# Patient Record
Sex: Female | Born: 1935 | Race: Asian | Hispanic: No | Smoking: Never smoker
Health system: Southern US, Community
[De-identification: ages and names within clinical notes are randomized; demographics above are authoritative.]

## PROBLEM LIST (undated history)

## (undated) DIAGNOSIS — R0602 Shortness of breath: Secondary | ICD-10-CM

## (undated) DIAGNOSIS — M1990 Unspecified osteoarthritis, unspecified site: Secondary | ICD-10-CM

## (undated) DIAGNOSIS — Z960 Presence of urogenital implants: Secondary | ICD-10-CM

## (undated) DIAGNOSIS — I2510 Atherosclerotic heart disease of native coronary artery without angina pectoris: Secondary | ICD-10-CM

## (undated) DIAGNOSIS — R5381 Other malaise: Secondary | ICD-10-CM

## (undated) DIAGNOSIS — R634 Abnormal weight loss: Secondary | ICD-10-CM

## (undated) DIAGNOSIS — I219 Acute myocardial infarction, unspecified: Secondary | ICD-10-CM

## (undated) DIAGNOSIS — I495 Sick sinus syndrome: Secondary | ICD-10-CM

## (undated) DIAGNOSIS — G8929 Other chronic pain: Secondary | ICD-10-CM

## (undated) DIAGNOSIS — I200 Unstable angina: Secondary | ICD-10-CM

## (undated) DIAGNOSIS — N1330 Unspecified hydronephrosis: Secondary | ICD-10-CM

## (undated) DIAGNOSIS — I4892 Unspecified atrial flutter: Secondary | ICD-10-CM

## (undated) DIAGNOSIS — R0603 Acute respiratory distress: Secondary | ICD-10-CM

## (undated) DIAGNOSIS — B029 Zoster without complications: Secondary | ICD-10-CM

## (undated) DIAGNOSIS — Z515 Encounter for palliative care: Secondary | ICD-10-CM

## (undated) DIAGNOSIS — H269 Unspecified cataract: Secondary | ICD-10-CM

## (undated) DIAGNOSIS — I639 Cerebral infarction, unspecified: Secondary | ICD-10-CM

## (undated) DIAGNOSIS — R5383 Other fatigue: Secondary | ICD-10-CM

## (undated) DIAGNOSIS — I5032 Chronic diastolic (congestive) heart failure: Secondary | ICD-10-CM

## (undated) DIAGNOSIS — F419 Anxiety disorder, unspecified: Secondary | ICD-10-CM

## (undated) DIAGNOSIS — M549 Dorsalgia, unspecified: Secondary | ICD-10-CM

## (undated) DIAGNOSIS — N390 Urinary tract infection, site not specified: Secondary | ICD-10-CM

## (undated) DIAGNOSIS — I252 Old myocardial infarction: Secondary | ICD-10-CM

## (undated) DIAGNOSIS — I10 Essential (primary) hypertension: Secondary | ICD-10-CM

## (undated) DIAGNOSIS — I5033 Acute on chronic diastolic (congestive) heart failure: Secondary | ICD-10-CM

## (undated) DIAGNOSIS — I509 Heart failure, unspecified: Secondary | ICD-10-CM

## (undated) DIAGNOSIS — E43 Unspecified severe protein-calorie malnutrition: Secondary | ICD-10-CM

## (undated) HISTORY — DX: Unspecified cataract: H26.9

## (undated) HISTORY — DX: Sick sinus syndrome: I49.5

## (undated) HISTORY — DX: Other fatigue: R53.83

## (undated) HISTORY — DX: Unstable angina: I20.0

## (undated) HISTORY — DX: Encounter for palliative care: Z51.5

## (undated) HISTORY — DX: Unspecified hydronephrosis: N13.30

## (undated) HISTORY — DX: Essential (primary) hypertension: I10

## (undated) HISTORY — DX: Shortness of breath: R06.02

## (undated) HISTORY — DX: Chronic diastolic (congestive) heart failure: I50.32

## (undated) HISTORY — DX: Unspecified severe protein-calorie malnutrition: E43

## (undated) HISTORY — DX: Acute on chronic diastolic (congestive) heart failure: I50.33

## (undated) HISTORY — DX: Old myocardial infarction: I25.2

## (undated) HISTORY — DX: Abnormal weight loss: R63.4

## (undated) HISTORY — DX: Other malaise: R53.81

## (undated) HISTORY — DX: Unspecified atrial flutter: I48.92

## (undated) HISTORY — DX: Urinary tract infection, site not specified: N39.0

## (undated) HISTORY — DX: Acute respiratory distress: R06.03

## (undated) HISTORY — DX: Zoster without complications: B02.9

---

## 2009-09-29 ENCOUNTER — Emergency Department (HOSPITAL_COMMUNITY): Admission: EM | Admit: 2009-09-29 | Discharge: 2009-09-29 | Payer: Self-pay | Admitting: Emergency Medicine

## 2010-04-17 ENCOUNTER — Inpatient Hospital Stay (HOSPITAL_COMMUNITY)
Admission: EM | Admit: 2010-04-17 | Discharge: 2010-04-20 | DRG: 308 | Disposition: A | Discharge status: Home Health | Payer: Medicaid Other | Source: Other Acute Inpatient Hospital | Attending: Cardiology | Admitting: Cardiology

## 2010-04-17 ENCOUNTER — Emergency Department (HOSPITAL_COMMUNITY): Payer: Medicaid Other

## 2010-04-17 ENCOUNTER — Emergency Department (HOSPITAL_COMMUNITY)
Admission: EM | Admit: 2010-04-17 | Discharge: 2010-04-17 | Disposition: A | Discharge status: Other Acute Inpatient Hospital | Payer: Medicaid Other | Attending: Emergency Medicine | Admitting: Emergency Medicine

## 2010-04-17 DIAGNOSIS — I499 Cardiac arrhythmia, unspecified: Secondary | ICD-10-CM | POA: Diagnosis present

## 2010-04-17 DIAGNOSIS — N390 Urinary tract infection, site not specified: Secondary | ICD-10-CM | POA: Diagnosis present

## 2010-04-17 DIAGNOSIS — R0602 Shortness of breath: Secondary | ICD-10-CM | POA: Insufficient documentation

## 2010-04-17 DIAGNOSIS — I10 Essential (primary) hypertension: Secondary | ICD-10-CM | POA: Diagnosis present

## 2010-04-17 DIAGNOSIS — R000 Tachycardia, unspecified: Secondary | ICD-10-CM | POA: Insufficient documentation

## 2010-04-17 DIAGNOSIS — I4589 Other specified conduction disorders: Secondary | ICD-10-CM | POA: Insufficient documentation

## 2010-04-17 DIAGNOSIS — I5031 Acute diastolic (congestive) heart failure: Secondary | ICD-10-CM | POA: Diagnosis present

## 2010-04-17 DIAGNOSIS — Z7982 Long term (current) use of aspirin: Secondary | ICD-10-CM

## 2010-04-17 DIAGNOSIS — I214 Non-ST elevation (NSTEMI) myocardial infarction: Secondary | ICD-10-CM

## 2010-04-17 DIAGNOSIS — R112 Nausea with vomiting, unspecified: Secondary | ICD-10-CM | POA: Insufficient documentation

## 2010-04-17 DIAGNOSIS — I4892 Unspecified atrial flutter: Principal | ICD-10-CM | POA: Diagnosis present

## 2010-04-17 DIAGNOSIS — I509 Heart failure, unspecified: Secondary | ICD-10-CM | POA: Diagnosis present

## 2010-04-17 DIAGNOSIS — R0682 Tachypnea, not elsewhere classified: Secondary | ICD-10-CM | POA: Insufficient documentation

## 2010-04-17 DIAGNOSIS — R072 Precordial pain: Secondary | ICD-10-CM

## 2010-04-17 DIAGNOSIS — I495 Sick sinus syndrome: Secondary | ICD-10-CM | POA: Diagnosis present

## 2010-04-17 LAB — COMPREHENSIVE METABOLIC PANEL
ALT: 23 U/L (ref 0–35)
Alkaline Phosphatase: 70 U/L (ref 39–117)
CO2: 24 mEq/L (ref 19–32)
Calcium: 8.8 mg/dL (ref 8.4–10.5)
Chloride: 101 mEq/L (ref 96–112)
GFR calc Af Amer: 59 mL/min — ABNORMAL LOW (ref >60)
GFR calc non Af Amer: 49 mL/min — ABNORMAL LOW (ref >60)

## 2010-04-17 LAB — POCT I-STAT, CHEM 8
BUN: 19 mg/dL (ref 6–23)
Creatinine, Ser: 1.1 mg/dL (ref 0.4–1.2)
HCT: 47 % — ABNORMAL HIGH (ref 36.0–46.0)
Hemoglobin: 16 g/dL — ABNORMAL HIGH (ref 12.0–15.0)
Potassium: 4.4 mEq/L (ref 3.5–5.1)
Sodium: 136 mEq/L (ref 135–145)

## 2010-04-17 LAB — URINE MICROSCOPIC-ADD ON

## 2010-04-17 LAB — URINALYSIS, ROUTINE W REFLEX MICROSCOPIC
Bilirubin Urine: NEGATIVE
Ketones, ur: NEGATIVE mg/dL
Protein, ur: >300 mg/dL — AB
Specific Gravity, Urine: 1.023 (ref 1.005–1.030)
pH: 5.5 (ref 5.0–8.0)

## 2010-04-17 LAB — CARDIAC PANEL(CRET KIN+CKTOT+MB+TROPI)
CK, MB: 6.1 ng/mL (ref 0.3–4.0)
Relative Index: INVALID (ref 0.0–2.5)
Total CK: 74 U/L (ref 7–177)
Total CK: 81 U/L (ref 7–177)

## 2010-04-17 LAB — CBC
HCT: 45 % (ref 36.0–46.0)
MCHC: 34.1 g/dL (ref 30.0–36.0)
MCV: 90.4 fL (ref 78.0–100.0)
MCV: 91.8 fL (ref 78.0–100.0)
Platelets: 288 10*3/uL (ref 150–400)
RDW: 12.1 % (ref 11.5–15.5)

## 2010-04-17 LAB — APTT: aPTT: 32 seconds (ref 24–37)

## 2010-04-17 LAB — PROTIME-INR
INR: 0.93 (ref 0.00–1.49)
Prothrombin Time: 12.7 seconds (ref 11.6–15.2)

## 2010-04-17 LAB — DIFFERENTIAL
Basophils Absolute: 0 10*3/uL (ref 0.0–0.1)
Lymphocytes Relative: 23 % (ref 12–46)
Neutrophils Relative %: 71 % (ref 43–77)

## 2010-04-17 LAB — POCT CARDIAC MARKERS
CKMB, poc: 8 ng/mL (ref 1.0–8.0)
Myoglobin, poc: 133 ng/mL (ref 12–200)

## 2010-04-17 LAB — BRAIN NATRIURETIC PEPTIDE: Pro B Natriuretic peptide (BNP): 901 pg/mL — ABNORMAL HIGH (ref 0.0–100.0)

## 2010-04-17 LAB — TSH: TSH: 0.687 u[IU]/mL (ref 0.350–4.500)

## 2010-04-17 LAB — HEPARIN LEVEL (UNFRACTIONATED): Heparin Unfractionated: <0.1 IU/mL — ABNORMAL LOW (ref 0.30–0.70)

## 2010-04-18 DIAGNOSIS — I495 Sick sinus syndrome: Secondary | ICD-10-CM

## 2010-04-18 LAB — URINE CULTURE
Colony Count: NO GROWTH
Culture: NO GROWTH

## 2010-04-18 LAB — CBC
HCT: 44.7 % (ref 36.0–46.0)
Hemoglobin: 14.9 g/dL (ref 12.0–15.0)
MCHC: 33.3 g/dL (ref 30.0–36.0)
MCV: 90.5 fL (ref 78.0–100.0)
RDW: 12.3 % (ref 11.5–15.5)
WBC: 7.7 10*3/uL (ref 4.0–10.5)

## 2010-04-18 LAB — CARDIAC PANEL(CRET KIN+CKTOT+MB+TROPI)
CK, MB: 6.4 ng/mL (ref 0.3–4.0)
CK, MB: 5.2 ng/mL — ABNORMAL HIGH (ref 0.3–4.0)
Relative Index: 5.4 — ABNORMAL HIGH (ref 0.0–2.5)
Total CK: 119 U/L (ref 7–177)
Total CK: 133 U/L (ref 7–177)
Troponin I: 0.13 ng/mL — ABNORMAL HIGH (ref 0.00–0.06)
Troponin I: 0.08 ng/mL — ABNORMAL HIGH (ref 0.00–0.06)

## 2010-04-18 LAB — BRAIN NATRIURETIC PEPTIDE: Pro B Natriuretic peptide (BNP): 573 pg/mL — ABNORMAL HIGH (ref 0.0–100.0)

## 2010-04-18 LAB — BASIC METABOLIC PANEL
Chloride: 107 mEq/L (ref 96–112)
Creatinine, Ser: 0.94 mg/dL (ref 0.4–1.2)
GFR calc non Af Amer: 56 mL/min — ABNORMAL LOW (ref >60)
Glucose, Bld: 112 mg/dL — ABNORMAL HIGH (ref 70–99)

## 2010-04-19 LAB — BASIC METABOLIC PANEL
BUN: 16 mg/dL (ref 6–23)
CO2: 25 mEq/L (ref 19–32)
Calcium: 8.6 mg/dL (ref 8.4–10.5)
Creatinine, Ser: 1.05 mg/dL (ref 0.4–1.2)
GFR calc non Af Amer: 49 mL/min — ABNORMAL LOW (ref >60)
Glucose, Bld: 122 mg/dL — ABNORMAL HIGH (ref 70–99)

## 2010-04-20 DIAGNOSIS — I4892 Unspecified atrial flutter: Secondary | ICD-10-CM

## 2010-04-25 ENCOUNTER — Encounter: Payer: Self-pay | Admitting: Physician Assistant

## 2010-04-25 ENCOUNTER — Telehealth: Payer: Self-pay | Admitting: Cardiology

## 2010-05-02 DIAGNOSIS — I5031 Acute diastolic (congestive) heart failure: Secondary | ICD-10-CM | POA: Insufficient documentation

## 2010-05-02 DIAGNOSIS — I495 Sick sinus syndrome: Secondary | ICD-10-CM | POA: Insufficient documentation

## 2010-05-02 DIAGNOSIS — I4892 Unspecified atrial flutter: Secondary | ICD-10-CM | POA: Insufficient documentation

## 2010-05-04 ENCOUNTER — Encounter: Payer: Self-pay | Admitting: Physician Assistant

## 2010-05-04 ENCOUNTER — Encounter (INDEPENDENT_AMBULATORY_CARE_PROVIDER_SITE_OTHER): Payer: Self-pay | Admitting: *Deleted

## 2010-05-04 DIAGNOSIS — I4892 Unspecified atrial flutter: Secondary | ICD-10-CM

## 2010-05-04 DIAGNOSIS — I495 Sick sinus syndrome: Secondary | ICD-10-CM

## 2010-05-04 DIAGNOSIS — I10 Essential (primary) hypertension: Secondary | ICD-10-CM | POA: Insufficient documentation

## 2010-05-04 DIAGNOSIS — I5032 Chronic diastolic (congestive) heart failure: Secondary | ICD-10-CM | POA: Insufficient documentation

## 2010-05-04 NOTE — Progress Notes (Signed)
Summary: pt weight up 3 1/2 lbs in 1 day    Phone Note Call from Patient   Caller: Daughter [phone] hong Reason for Call: Talk to Nurse Summary of Call: pt's weight up 3 1/2 ilbs in 1 day Initial call taken by: Glynda Jaeger,  April 25, 2010 11:12 AM  Follow-up for Phone Call        Spoke with daughter who states wt is up, No SOB and no edema noted.  Pt complains of fatigue only.  Will discuss with DOD and call daughter back with orders Follow-up by: Charolotte Capuchin, RN,  April 25, 2010 12:01 PM  Additional Follow-up for Phone Call Additional follow up Details #1::        Per Dr Jens Som - pt to take extra Furosemide 40 mg  today and then extra 40 mg if wt up 3lbs over night. Avie Arenas, RN  Spoke with daughter - [place] who states understanding and repeated information back to me Additional Follow-up by: Charolotte Capuchin, RN,  April 25, 2010 3:16 PM

## 2010-05-05 ENCOUNTER — Encounter: Payer: Self-pay | Admitting: Physician Assistant

## 2010-05-05 ENCOUNTER — Encounter (INDEPENDENT_AMBULATORY_CARE_PROVIDER_SITE_OTHER): Payer: 59 | Admitting: Physician Assistant

## 2010-05-05 DIAGNOSIS — I4892 Unspecified atrial flutter: Secondary | ICD-10-CM

## 2010-05-05 DIAGNOSIS — F419 Anxiety disorder, unspecified: Secondary | ICD-10-CM

## 2010-05-05 DIAGNOSIS — I10 Essential (primary) hypertension: Secondary | ICD-10-CM

## 2010-05-05 DIAGNOSIS — R079 Chest pain, unspecified: Secondary | ICD-10-CM

## 2010-05-05 DIAGNOSIS — I5032 Chronic diastolic (congestive) heart failure: Secondary | ICD-10-CM

## 2010-05-05 NOTE — Assessment & Plan Note (Signed)
She is maintaining normal sinus rhythm.  She is tolerating pindolol.

## 2010-05-05 NOTE — Assessment & Plan Note (Signed)
She is currently using a benzodiazepine that she received in [place].  I have asked her to follow up with her primary care provider for this problem.

## 2010-05-05 NOTE — Assessment & Plan Note (Signed)
Controlled.  

## 2010-05-05 NOTE — Progress Notes (Signed)
History of Present Illness: Primary Cardiologist:  Dr. Rollene Rotunda  The patient is a 77 year old [place] female who presented to [facility] 04/17/10 with complaints of not feeling well.  She was noted to be in an atypical atrial flutter.  She spontaneously converted to normal sinus rhythm.  She was also in noted to have signs of a urinary tract infection.  This was treated with ciprofloxacin.  Her BNP was elevated at 901 and she was diuresed for diastolic heart failure.  Her echocardiogram demonstrated an EF of 55% with moderate LVH, mild AI, mild MR, PASP 39.  She developed essential tachybradycardia syndrome with beta blocker therapy.  Therefore, she was switched to pindolol.  She tolerated this very well.  While hospitalized, her family wished for conservative therapy only without pacemaker implantation.  Of note, she essentially ruled in for a non-ST elevation myocardial infarction with a peak troponin of 0.13.  As noted above, she was treated conservatively.  She presents for followup.  Her daughter is with her who interprets.  She states that she does feel somewhat better.  She has episodes of hot flashes.  She also has episodes of chest discomfort.  This is a substernal discomfort that lasts about 10 minutes.  She denies any radiation or associated shortness of breath.  She sleeps on 3 pillows.  This is unchanged.  Her weight has not changed.  She denies syncope.  She denies any symptoms of indigestion.  She denies dysphagia.  Past Medical History  Diagnosis Date  . Paroxysmal atrial flutter [facility] admission 2/5-04/20/2010    High fall risk-not a Coumadin candidate  . Hypertension   . Chronic diastolic heart failure     Echo February 2012: EF 55%; moderate LVH; mild AI; mild MR; PASP 39  . Sick sinus syndrome     Limits use of beta blocker-pindolol tolerated  . NSTEMI (non-ST elevated myocardial infarction)     Troponin 0.12, 0.13, 0.08 during admission 04/2010    Current  Medications: Current outpatient prescriptions:acetaminophen (TYLENOL) 325 MG tablet, Take 325 mg by mouth every 6 (six) hours as needed.  , Disp: , Rfl: ;  aspirin 81 MG tablet, Take 81 mg by mouth daily.  , Disp: , Rfl: ;  furosemide (LASIX) 40 MG tablet, Take 40 mg by mouth daily.  , Disp: , Rfl: ;  pindolol (VISKEN) 5 MG tablet, Take 2.5 mg by mouth 2 (two) times daily.  , Disp: , Rfl:  UNABLE TO FIND, Take 0.25 tablets by mouth daily. Bromazepam 6 mg (benzodiazepine from [place]) , Disp: , Rfl:   No Known Allergies  Vital Signs: BP 124/76  Pulse 80  Ht 4\' 10"  (1.473 m)  Wt 99 lb (44.906 kg)  BMI 20.69 kg/m2  PHYSICAL EXAM: Thin, frail female in a wheelchair in no acute distress HEENT: normal Neck: no JVD At 90 Cardiac:  normal S1, S2; RRR; no murmur Lungs:  Decreased breath sounds, crackles at the bases, no wheezes Abd: soft, nontender, no hepatomegaly Ext: no edema Skin: warm and dry Neuro:  CNs 2-12 intact, no focal abnormalities noted  [id number] sinus rhythm, heart rate 80, normal axis, ST depression with T wave inversions in leads one and aVL, T wave inversions in leads V2-V6.  ST changes worse when compared to prior EKGs in the hospital.

## 2010-05-05 NOTE — Assessment & Plan Note (Signed)
I discussed her case today with Dr. Antoine Poche who also saw the patient.  Her EKG is clearly worse.  She is having episodes of chest discomfort.  She essentially ruled in for a non-ST elevation myocardial infarction.  She most likely has significant coronary disease.  Given her age, we had a frank discussion with her daughter today regarding further treatment.  At this point, her daughter continues to wish for conservative therapy.  Therefore, she will continue on aspirin.  We will also add isosorbide 30 mg a day.  Should she have worsening symptoms, she should go to the emergency room.  Should she change her mind, we could certainly consider proceeding with cardiac catheterization.  Otherwise, she will follow up with Dr. Antoine Poche in one month.

## 2010-05-05 NOTE — Assessment & Plan Note (Signed)
Her volume appears stable.  We will obtain a basic metabolic panel and BNP to followup.

## 2010-05-06 ENCOUNTER — Other Ambulatory Visit: Payer: 59

## 2010-05-10 NOTE — Assessment & Plan Note (Signed)
Summary: Please see note scanned in done in [facility].   Visit Type:  Initial Consult  CC:  tired / CP after taking lasix / burning all over .  History of Present Illness: Please see note scanned in done in [facility].  Current Medications (verified): 1)  Pindolol 5 Mg Tabs (Pindolol) .... Take 1/2 Two Times A Day 2)  Lasix 40 Mg Tabs (Furosemide) .... Take One Daily 3)  Aspirin 81 Mg Tbec (Aspirin) .... Take One Daily 4)  Bromazepam 6mg  .... 1/4 Tablet By Mouth Once Daily 5)  Acetaminophen 325 Mg  Tabs (Acetaminophen) .Marland Kitchen.. 1-2 Tabs By Mouth Every 4 Hrs As Needed  Allergies (verified): No Known Drug Allergies  Vital Signs:  Patient profile:   77 year old female Height:      58 inches Weight:      99 pounds BMI:     20.77 Pulse rate:   80 / minute BP sitting:   124 / 76  (left arm) Cuff size:   regular  Vitals Entered By: Hardin Negus, RMA (May 05, 2010 4:40 PM)   Other Orders: EKG w/ Interpretation (93000)  Patient Instructions: 1)  Your physician recommends that you schedule a follow-up appointment in: 06/08/10 @ 12:00 to see Dr. Antoine Poche. 2)  Your physician recommends that you return for lab work in: Tomorrow @ our [place] office location across from [facility], BMET [phone], BNP 786.50. 3)  Your physician has recommended you make the following change in your medication: START TAKING ISOSORBIDE MONONITRATE 30 MG 1 TAB once daily .Marland KitchenMarland KitchenTAKE 81 MG ASPIRIN 30 MINUTES BEFORE TAKING THE ISOSORBIDE AS PER Nasri Boakye, PA-C. 4)  You have been referred to FOLLOW UP WITH YOUR PRIMARY CARE PHYSCIAN. Prescriptions: ISOSORBIDE MONONITRATE CR 30 MG XR24H-TAB (ISOSORBIDE MONONITRATE) 1 tab once daily .Marland KitchenMarland KitchenTAKE ASPIRIN 81 MG 30 MINUTES BEFORE TAKING THE ISOSORBIDE  #30 x 11   Entered by:   Danielle Rankin, CMA   Authorized by:   Tereso Newcomer PA-C   Signed by:   Danielle Rankin, CMA on 05/05/2010   Method used:   Electronically to        [facility] [address] (retail)       [address]       [place], [place]  [address]       Ph: [phone]       Fax: [phone]   RxID:   [id number]  I have personally reviewed the prescriptions today for accuracy.. . Tereso Newcomer PA-C  May 05, 2010 6:04 PM

## 2010-05-10 NOTE — Miscellaneous (Signed)
  Clinical Lists Changes  Medications: Added new medication of PINDOLOL 5 MG TABS (PINDOLOL) take 1/2 two times a day Added new medication of LASIX 40 MG TABS (FUROSEMIDE) take one daily Added new medication of ASPIRIN 81 MG TBEC (ASPIRIN) take one daily

## 2010-05-24 ENCOUNTER — Encounter: Payer: Self-pay | Admitting: Cardiology

## 2010-05-24 NOTE — Progress Notes (Signed)
Summary: Office Visit  Office Visit   Imported By: Earl Many 05/17/2010 11:01:15  _____________________________________________________________________  External Attachment:    Type:   Image     Comment:   External Document

## 2010-05-26 NOTE — Discharge Summary (Signed)
[name], [name]                 ACCOUNT NO.:  [id number]  MEDICAL RECORD NO.:  [id number]           PATIENT TYPE:  I  LOCATION:  2033                         FACILITY:  [facility]  PHYSICIAN:  Rollene Rotunda, MD, FACCDATE OF BIRTH:  23-Mar-1916  DATE OF ADMISSION:  04/17/2010 DATE OF DISCHARGE:  04/20/2010                              DISCHARGE SUMMARY   PROCEDURES: 1. Portable chest x-ray. 2. 2D echocardiogram.  PRIMARY FINAL DISCHARGE DIAGNOSES: 1. Atypical atrial flutter with a tachybradycardia syndrome. 2. Acute diastolic congestive heart failure.  SECONDARY DIAGNOSES: 1. Elevated cardiac enzymes with a peak CK-MB of 119/6.4 (index 5.4)     and a peak troponin I of 0.13. 2. Hypertension.  TIME AT DISCHARGE:  33 minutes.  HOSPITAL COURSE:  Yvette [name] is a 77 year old [place] female with a possible history of arrhythmia and hypertension.  She recently moved here from [place].  She developed acute nausea and vomiting with weakness.  She came to the hospital where she was noted to have atrial arrhythmia, probably atypical atrial flutter and variable conduction. She spontaneously converted to sinus rhythm.  She also had evidence of a urinary tract infection with an abnormal urinalysis.  She had shortness of breath and her BNP was elevated at 901.  She was admitted for further evaluation and treatment.  She had some elevation in her cardiac enzymes.  She required IV Lasix for diuresis and was initially on Lasix 40 mg IV.  This was changed to p.o. as her volume status improved.  By discharge, her O2 saturation was 96% on room air.  Her weight was approximately 44 kg.  Her medications prior to admission were unclear.  She was started on aspirin and Ciprofloxacin for possible UTI.  She completed 3 days of antibiotics prior to discharge and can follow up as an outpatient.  An attempt was made to start her on a beta-blocker, but she had problems with metoprolol 12.5 mg because  of tachy-brady syndrome.  She was given pindolol 2.5 mg b.i.d. and did not have as many problems with bradycardia.  She was initially started on heparin but because of balance issues and weakness, is not felt to be a good Coumadin candidate.  A 2D echocardiogram showed an EF of 55%.  She had moderate LVH and mild aortic regurgitation.  Her PAS at that time was 39.  Dr. Antoine Poche discussed the situation with the family.  The family wanted conservative therapy with no pacer at this point.  Initially, she was made a partial code but after discussions with the patient and family, it was recognized that she wanted to be a full code and this was changed.  She was seen by Physical Therapy and a rolling walker is recommended.  There are no followup physical therapy or DME needs.  The patient has family caregivers that are able to assist.  Her blood pressure was monitored carefully during her hospital stay and by discharge, had improved with a systolic in the 130s and 140s.  This can be further managed as an outpatient.  On April 20, 2010, Yvette [name]'s respiratory status was considered to  be improved.  Her heart rate and rhythm were stable.  A home health consult was ordered for management of heart failure.  Dr. Antoine Poche evaluated Yvette [name] and considered her stable for discharge on April 20, 2010, to follow up as an outpatient.  DISCHARGE INSTRUCTIONS:  Her activity level is to be increased gradually.  She is encouraged to weigh herself daily and stick to a low- sodium heart-healthy diet.  She is encouraged to obtain a primary care physician.  She is to follow up with Tereso Newcomer, PA-C for Dr. Antoine Poche on May 04, 2010, at 11 a.m.  DISCHARGE MEDICATIONS: 1. Tylenol 350 mg 1-2 tablets q.4 h. p.r.n. 2. Pindolol 5 mg 1/2 tablet p.o. q.12 h. 3. Digoxin is discontinued. 4. Nifedipine is discontinued. 5. Lasix 40 mg daily. 6. Aspirin 81 mg daily. 7. Lexomil 6 mg 1/4 tablet daily as  prior to admission. 8. Trimetazidine 20 mg b.i.d. is on hold for now.     Theodore Demark, PA-C   ______________________________ Rollene Rotunda, MD, [facility]    RB/MEDQ  D:  04/20/2010  T:  04/21/2010  Job:  [id number]  Electronically Signed by Theodore Demark PA-C on 04/29/2010 12:05:28 PM Electronically Signed by Rollene Rotunda MD [facility] on 05/26/2010 09:55:36 AM

## 2010-05-26 NOTE — H&P (Signed)
NAMEMINSA, WEDDINGTON                 ACCOUNT NO.:  [id number]  MEDICAL RECORD NO.:  [id number]           PATIENT TYPE:  I  LOCATION:  2033                         FACILITY:  [facility]  PHYSICIAN:  Rollene Rotunda, MD, FACCDATE OF BIRTH:  [birth year]  DATE OF ADMISSION:  04/17/2010 DATE OF DISCHARGE:                             HISTORY & PHYSICAL   PRIMARY:  None.  CARDIOLOGIST:  None.  REASON FOR PRESENTATION:  Evaluate the patient with weakness and nausea.  HISTORY OF PRESENT ILLNESS:  The patient is a 77 year old who moved here recently from the [place].  While there, she was given a vague diagnosis of arrhythmias and apparently has been taking digoxin.  She recently moved here and has had no recent cardiovascular complaints.  Today, she developed some acute nausea and vomiting.  She had some weakness.  There was no mention of arrhythmias.  She was not presyncopal.  She had no chest pressure, neck or arm discomfort.  She has no shortness of breath, PND, or orthopnea.  She has had no fevers, chills, or cough.  She has had no weight gain.  She denies any pyuria, dysuria, or urinary frequency.  She came [facility] where she was noted to have an arrhythmia.  There was thought to be possibly heart block by the ER. However, it demonstrates atrial arrhythmia, probably an atypical flutter with variable conduction.  She has since converted to sinus rhythm.  She was also found to have many bacteria in her urine.  She is admitted to Cardiology for further management.  PAST MEDICAL HISTORY:  Hypertension, questionable arrhythmias.  PAST SURGICAL HISTORY:  None.  ALLERGIES:  None.  MEDICATIONS:  Unknown (she has at least been taking digoxin, but it is unclear what other medication she has had from [place]).  SOCIAL HISTORY:  The patient lives with her family and does not smoke or drink.  FAMILY HISTORY:  Noncontributory.  REVIEW OF SYSTEMS:  Negative per her daughter who interprets.   Negative for all systems.  PHYSICAL EXAMINATION:  GENERAL:  The patient is tiny.  She is in no distress. VITAL SIGNS:  Blood pressure 126/73, heart rate 87 and regular, respiratory rate 16, and afebrile. HEENT:  Eyes are unremarkable.  Pupils are equal, round, and reactive to light.  Fundi not visualized.  Oral mucosa unremarkable.  Poor dentition. NECK:  No jugular venous distention at 45 degrees.  Carotid upstroke brisk and symmetric.  No bruits.  No thyromegaly. LYMPHATICS:  No cervical, axillary, or inguinal adenopathy. LUNGS:  Decreased breath sounds with bilateral crackles at the bases. BACK:  No costovertebral angle tenderness. CHEST:  Unremarkable. HEART:  PMI not displaced or sustained.  S1 and S2 within normal limits. No S3.  No S4.  No clicks.  No rubs.  2/6 apical systolic murmur radiating slightly out the aortic outflow tract.  No diastolic murmurs. ABDOMEN:  Flat.  Positive bowel sounds, normal in frequency and pitch. No bruits.  No rebound.  No guarding.  No midline pulsatile mass.  No hepatomegaly.  No splenomegaly. SKIN:  No rashes.  No nodules. EXTREMITIES:  2+ pulses  throughout.  No edema.  No cyanosis.  No clubbing. NEURO:  Oriented to person, place, and time.  Cranial nerves II-XII grossly intact.  Motor grossly intact.  Chest x-ray:  Left effusion with early CHF.  LABORATORY DATA:  Digoxin 1.6.  BNP 901.  UA with many bacteria and elevated protein.  WBC 7.2, hemoglobin 15.3.  Sodium 36, potassium 4.4, BUN 19, and creatinine 1.1.  Point-of-cardiac care markers negative x1.  EKG:  Probable atypical atrial flutter with variable conduction, minimal ST-segment elevation in lead III with questionable old inferior infarct, left ventricle hypertrophy by voltage criteria, high lateral ST depression with questionable subendocardial ischemia.  ASSESSMENT/PLAN: 1. Arrhythmia:  The patient had transient arrhythmia.  She had a     controlled ventricular rate.  At  this point, I will put her on     heparin and a low dose of aspirin.  I do not think she is a long-     term Coumadin candidate as she is very wobbly per her family.  They     would want very conservative therapy.  She will be monitored on     telemetry. 2. Congestive heart failure:  She does appear to have some volume     overload.  I am going to give her very low dose of IV Lasix and     watch this closely.  She will need an echocardiogram. 3. Urinary tract infection:  I will pick a half dose of Cipro and     check the urine culture and sensitivity. 4. Nausea and vomiting:  She had an episode of this.  Her digoxin     level was slightly elevated.  I will be holding this medication.     She will be treated symptomatically.     Rollene Rotunda, MD, [facility]     JH/MEDQ  D:  04/17/2010  T:  04/17/2010  Job:  [id number]  Electronically Signed by Rollene Rotunda MD [facility] on 05/26/2010 09:55:33 AM

## 2010-06-08 ENCOUNTER — Ambulatory Visit: Payer: 59 | Admitting: Cardiology

## 2010-06-10 ENCOUNTER — Ambulatory Visit: Payer: 59 | Admitting: Cardiology

## 2010-07-14 ENCOUNTER — Ambulatory Visit (INDEPENDENT_AMBULATORY_CARE_PROVIDER_SITE_OTHER): Payer: Medicaid Other | Admitting: Cardiology

## 2010-07-14 ENCOUNTER — Encounter: Payer: Self-pay | Admitting: Cardiology

## 2010-07-14 VITALS — BP 141/74 | HR 70 | Ht <= 58 in | Wt 100.0 lb

## 2010-07-14 DIAGNOSIS — R079 Chest pain, unspecified: Secondary | ICD-10-CM

## 2010-07-14 DIAGNOSIS — I495 Sick sinus syndrome: Secondary | ICD-10-CM

## 2010-07-14 DIAGNOSIS — I5031 Acute diastolic (congestive) heart failure: Secondary | ICD-10-CM

## 2010-07-14 DIAGNOSIS — I4892 Unspecified atrial flutter: Secondary | ICD-10-CM

## 2010-07-14 DIAGNOSIS — I10 Essential (primary) hypertension: Secondary | ICD-10-CM

## 2010-07-14 NOTE — Assessment & Plan Note (Signed)
She has had no further arrhythmias. No change in therapy is indicated. She is not a Coumadin candidate.

## 2010-07-14 NOTE — Patient Instructions (Signed)
Follow up in 6 months with Dr Antoine Poche Continue current medicaitons

## 2010-07-14 NOTE — Assessment & Plan Note (Signed)
Her blood pressure is controlled and she will continue meds as listed. 

## 2010-07-14 NOTE — Progress Notes (Signed)
HPI The patient presents for followup of the above problems. Thankfully since she was last seen she has done relatively well. She has had none of the chest discomfort that she was having. Her breathing is fine. She is not describing PND or orthopnea. She is not having weight gain or edema. He's had no palpitations, presyncope or syncope. She is having trouble sleeping.  No Known Allergies  Current Outpatient Prescriptions  Medication Sig Dispense Refill  . acetaminophen (TYLENOL) 325 MG tablet Take 325 mg by mouth every 6 (six) hours as needed.        Marland Kitchen aspirin 81 MG tablet Take 81 mg by mouth daily.        . fish oil-omega-3 fatty acids 1000 MG capsule Take 2 g by mouth daily.        . furosemide (LASIX) 40 MG tablet Take 40 mg by mouth daily.        . isosorbide mononitrate (IMDUR) 30 MG 24 hr tablet Take 30 mg by mouth daily.        Marland Kitchen Lysine 500 MG CAPS Take by mouth.        . Multiple Vitamin (MULTIVITAMIN) tablet Take 1 tablet by mouth daily.        . pindolol (VISKEN) 5 MG tablet Take 2.5 mg by mouth 2 (two) times daily.        Marland Kitchen DISCONTD: UNABLE TO FIND Take 0.25 tablets by mouth daily. Bromazepam 6 mg (benzodiazepine from [place])         Past Medical History  Diagnosis Date  . Paroxysmal atrial flutter [facility] admission 2/5-04/20/2010    High fall risk-not a Coumadin candidate  . Hypertension   . Chronic diastolic heart failure     Echo February 2012: EF 55%; moderate LVH; mild AI; mild MR; PASP 39  . Sick sinus syndrome     Limits use of beta blocker-pindolol tolerated  . NSTEMI (non-ST elevated myocardial infarction)     Troponin 0.12, 0.13, 0.08 during admission 04/2010    No past surgical history on file.  ROS:  As stated in the HPI and negative for all other systems.  PHYSICAL EXAM BP 141/74  Pulse 70  Ht 4\' 10"  (1.473 m)  Wt 100 lb (45.36 kg)  BMI 20.90 kg/m2 PHYSICAL EXAM GEN:  No distress NECK:  No jugular venous distention at 90 degrees, waveform within normal  limits, carotid upstroke brisk and symmetric, no bruits, no thyromegaly LYMPHATICS:  No cervical adenopathy LUNGS:  Clear to auscultation bilaterally CHEST:  Unremarkable HEART:  S1 and S2 within normal limits, no S3, no S4, no clicks, no rubs, no murmurs ABD:  Positive bowel sounds normal in frequency in pitch, no bruits, no rebound, no guarding, unable to assess midline mass or bruit with the patient seated. EXT:  2 plus pulses throughout, moderate edema, no cyanosis no clubbing NEURO:  Cranial nerves II through XII grossly intact, motor grossly intact throughout  EKG:  Sinus rhythm, rate 82, axis within normal limits, the inverted T waves lateral and inferior leads, ST elevation in lead 3 and aVF, QT significantly prolonged, no change from previous  ASSESSMENT AND PLAN

## 2010-07-14 NOTE — Assessment & Plan Note (Signed)
.  The patient is euvolemic. She will continue meds as listed.

## 2010-08-25 ENCOUNTER — Telehealth: Payer: Self-pay | Admitting: Cardiology

## 2010-08-25 NOTE — Telephone Encounter (Signed)
Per daughter as pt does not speak [place].  Pt has been having weakness, sweating and dizziness for three days.  HR has been 71 -85 BP have been 145/89 and 181/84.  Daughter doesn't feel as though pt needs to go to the ED but pt does need to be evaluated.  appt given for 08/26/2010 at 9am.  Daughter is in agreement and know to take pt to the ED if s/s worse or increase.

## 2010-08-25 NOTE — Telephone Encounter (Signed)
Per pt daughter calling, c/o sweaty, dizziness, weak, no chestpain.

## 2010-08-26 ENCOUNTER — Encounter: Payer: Self-pay | Admitting: Physician Assistant

## 2010-08-26 ENCOUNTER — Ambulatory Visit (HOSPITAL_COMMUNITY)
Admission: RE | Admit: 2010-08-26 | Discharge: 2010-08-26 | Disposition: A | Discharge status: Home / Self Care | Payer: Medicaid Other | Source: Ambulatory Visit | Attending: Cardiology | Admitting: Cardiology

## 2010-08-26 ENCOUNTER — Ambulatory Visit (INDEPENDENT_AMBULATORY_CARE_PROVIDER_SITE_OTHER): Payer: Medicaid Other | Admitting: Physician Assistant

## 2010-08-26 VITALS — BP 155/65 | HR 78 | Ht <= 58 in | Wt 100.0 lb

## 2010-08-26 DIAGNOSIS — R5383 Other fatigue: Secondary | ICD-10-CM | POA: Insufficient documentation

## 2010-08-26 DIAGNOSIS — R61 Generalized hyperhidrosis: Secondary | ICD-10-CM

## 2010-08-26 DIAGNOSIS — R42 Dizziness and giddiness: Secondary | ICD-10-CM | POA: Insufficient documentation

## 2010-08-26 DIAGNOSIS — I509 Heart failure, unspecified: Secondary | ICD-10-CM

## 2010-08-26 DIAGNOSIS — R5381 Other malaise: Secondary | ICD-10-CM | POA: Insufficient documentation

## 2010-08-26 DIAGNOSIS — I5032 Chronic diastolic (congestive) heart failure: Secondary | ICD-10-CM

## 2010-08-26 DIAGNOSIS — M899 Disorder of bone, unspecified: Secondary | ICD-10-CM | POA: Insufficient documentation

## 2010-08-26 DIAGNOSIS — M8448XA Pathological fracture, other site, initial encounter for fracture: Secondary | ICD-10-CM | POA: Insufficient documentation

## 2010-08-26 DIAGNOSIS — I4892 Unspecified atrial flutter: Secondary | ICD-10-CM

## 2010-08-26 DIAGNOSIS — I10 Essential (primary) hypertension: Secondary | ICD-10-CM

## 2010-08-26 DIAGNOSIS — I517 Cardiomegaly: Secondary | ICD-10-CM | POA: Insufficient documentation

## 2010-08-26 LAB — CBC WITH DIFFERENTIAL/PLATELET
Basophils Relative: 0.2 % (ref 0.0–3.0)
Eosinophils Relative: 3.1 % (ref 0.0–5.0)
HCT: 39.9 % (ref 36.0–46.0)
Lymphs Abs: 1.2 10*3/uL (ref 0.7–4.0)
MCV: 90.8 fl (ref 78.0–100.0)
Monocytes Relative: 8.3 % (ref 3.0–12.0)
Neutrophils Relative %: 66.5 % (ref 43.0–77.0)
Platelets: 247 10*3/uL (ref 150.0–400.0)
RBC: 4.39 Mil/uL (ref 3.87–5.11)
WBC: 5.7 10*3/uL (ref 4.5–10.5)

## 2010-08-26 LAB — BASIC METABOLIC PANEL
BUN: 21 mg/dL (ref 6–23)
Calcium: 9.2 mg/dL (ref 8.4–10.5)
Chloride: 106 mEq/L (ref 96–112)
Creatinine, Ser: 1 mg/dL (ref 0.4–1.2)

## 2010-08-26 LAB — TSH: TSH: 0.12 u[IU]/mL — ABNORMAL LOW (ref 0.35–5.50)

## 2010-08-26 LAB — HEPATIC FUNCTION PANEL
ALT: 26 U/L (ref 0–35)
Bilirubin, Direct: 0.1 mg/dL (ref 0.0–0.3)
Total Bilirubin: 0.7 mg/dL (ref 0.3–1.2)

## 2010-08-26 LAB — BRAIN NATRIURETIC PEPTIDE: Pro B Natriuretic peptide (BNP): 252 pg/mL — ABNORMAL HIGH (ref 0.0–100.0)

## 2010-08-26 MED ORDER — MECLIZINE HCL 25 MG PO TABS: 25.0000 mg | ORAL_TABLET | Freq: Three times a day (TID) | ORAL | Status: AC | PRN

## 2010-08-26 MED ORDER — AMLODIPINE BESYLATE 2.5 MG PO TABS: 2.5000 mg | ORAL_TABLET | Freq: Every day | ORAL | Status: DC

## 2010-08-26 NOTE — Assessment & Plan Note (Signed)
Maintaining sinus rhythm 

## 2010-08-26 NOTE — Assessment & Plan Note (Signed)
Elevated.  Start low dose Norvasc, 2.5 mg daily.

## 2010-08-26 NOTE — Assessment & Plan Note (Signed)
Volume appears stable.  Check BNP and chest x-ray as noted.

## 2010-08-26 NOTE — Assessment & Plan Note (Addendum)
She describes symptoms that sound consistent with vertigo.  However, they are not classic.  Her blood pressure is elevated.  I think we need to rule out an ischemic event.  Continue aspirin.  I will obtain an MRI of her brain.  I will also get carotid Dopplers. If these are unremarkable, we could consider sending her to vestibular rehabilitation.  I will give her meclizine to use as needed for dizziness.  Obtain basic metabolic panel, LFTs and TSH.  Follow up in one to 2 weeks with me.  Of note, we will make a referral to our [place] office.  Her daughter does to her PCP there.

## 2010-08-26 NOTE — Patient Instructions (Signed)
Your physician recommends that you schedule a follow-up appointment in: 1-2 weeks with Tereso Newcomer, [facility]   Your physician recommends that you return for lab work in: today -- BMP, LIVER, BNP, TSH, CBC   A chest x-ray takes a picture of the organs and structures inside the chest, including the heart, lungs, and blood vessels. This test can show several things, including, whether the heart is enlarges; whether fluid is building up in the lungs; and whether pacemaker / defibrillator leads are still in place.  Your physician has recommended you make the following change in your medication: start Amlodipine 2.5 mg daily and Meclizine 25 mg 1/2 -1 tab every 8 hours as needed for dizziness  Your physician has requested that you have a carotid duplex. This test is an ultrasound of the carotid arteries in your neck. It looks at blood flow through these arteries that supply the brain with blood. Allow one hour for this exam. There are no restrictions or special instructions.  You have been referred to [facility] [place]  .

## 2010-08-26 NOTE — Assessment & Plan Note (Addendum)
She's had a slight cough.  She has some crackles at the bases on lung exam.  I will obtain a chest x-ray to rule out atypical presentation for pneumonia.  Also check a TSH, CBC And BNP.

## 2010-08-26 NOTE — Progress Notes (Signed)
History of Present Illness: Primary Cardiologist:  Dr. Rollene Rotunda  Yvette [name] is a 77 y[place] who presented to [facility] 2/12 with atypical atrial flutter.  She spontaneously converted to normal sinus rhythm.  She was diuresed for diastolic heart failure.  Her echocardiogram demonstrated an EF of 55% with moderate LVH, mild AI, mild MR, PASP 39.  She demonstrated signs of tachybradycardia syndrome with beta blocker therapy and she was switched to pindolol.  She also ruled in for a non-ST elevation myocardial infarction with a peak troponin of 0.13.  Her family wished for her to be treated conservatively.  She complained of chest pain upon follow up in 2/12 and Isosorbide was added to her regimen.  When she saw Dr. Antoine Poche last month, she denied any further chest pain and she remained in NSR.  She presents with complaints of dizziness for the last several days.  She describes it as a spinning sensation.  Her daughter provides the history.  I cannot tell if there is a position she can get in that will reproduce her symptoms.  She denies chest pain.  She denies shortness of breath.  She denies syncope.  She may have a slight cough.  She complains of significant diaphoresis.  She denies palpitations.  She denies orthopnea, PND or edema.  She denies any recent URI symptoms.  She denies tinnitus or hearing loss.  Past Medical History  Diagnosis Date  . Paroxysmal atrial flutter [facility] admission 2/5-04/20/2010    High fall risk-not a Coumadin candidate  . Hypertension   . Chronic diastolic heart failure     Echo February 2012: EF 55%; moderate LVH; mild AI; mild MR; PASP 39  . Sick sinus syndrome     Limits use of beta blocker-pindolol tolerated  . NSTEMI (non-ST elevated myocardial infarction)     Troponin 0.12, 0.13, 0.08 during admission 04/2010    Current Medications: Current Outpatient Prescriptions  Medication Sig Dispense Refill  . acetaminophen (TYLENOL) 325 MG tablet  Take 325 mg by mouth every 6 (six) hours as needed.        Marland Kitchen aspirin 81 MG tablet Take 81 mg by mouth daily.        . fish oil-omega-3 fatty acids 1000 MG capsule Take by mouth. 2-3 TIMES PER WEEK.      . furosemide (LASIX) 40 MG tablet Take 40 mg by mouth daily.        . isosorbide mononitrate (IMDUR) 30 MG 24 hr tablet Take 30 mg by mouth daily.        Marland Kitchen Lysine 500 MG CAPS Take by mouth.        . Multiple Vitamin (MULTIVITAMIN) tablet Take 1 tablet by mouth daily.        . pindolol (VISKEN) 5 MG tablet Take 2.5 mg by mouth 2 (two) times daily.          No Known Allergies   History  Substance Use Topics  . Smoking status: Never Smoker   . Smokeless tobacco: Not on file  . Alcohol Use: No    ROS:  See history of present illness.  She denies fevers, chills.  She denies melena, hematochezia, vomiting, diarrhea.  All other systems reviewed and negative.   Vital Signs: BP 155/65  Pulse 78  Ht 4\' 10"  (1.473 m)  Wt 100 lb (45.36 kg)  BMI 20.90 kg/m2  PHYSICAL EXAM: Thin, frail female in no acute distress HEENT: normal Neck: no JVD At 45 Cardiac:  normal S1, S2; RRR; no murmur Lungs:  Decreased breath sounds, crackles at the bases, no wheezes Abd: soft, nontender, no hepatomegaly Ext: no edema Skin: warm and dry Neuro:  CNs 2-12 intact, no focal abnormalities noted, Romberg negative, strength equal in bilateral upper and lower extremities  EKG:  Sinus rhythm, heart rate 80, LVH with repolarization abnormality, no significant change in her prior tracing.  ASSESSMENT AND PLAN:

## 2010-08-29 ENCOUNTER — Encounter: Payer: Self-pay | Admitting: *Deleted

## 2010-08-30 ENCOUNTER — Inpatient Hospital Stay (HOSPITAL_COMMUNITY): Admission: RE | Admit: 2010-08-30 | Payer: Medicaid Other | Source: Ambulatory Visit

## 2010-08-30 ENCOUNTER — Other Ambulatory Visit: Payer: Self-pay | Admitting: Physician Assistant

## 2010-08-30 DIAGNOSIS — R42 Dizziness and giddiness: Secondary | ICD-10-CM

## 2010-09-07 ENCOUNTER — Encounter: Payer: Medicaid Other | Admitting: *Deleted

## 2010-09-12 ENCOUNTER — Other Ambulatory Visit: Payer: Medicaid Other | Admitting: *Deleted

## 2010-09-12 ENCOUNTER — Ambulatory Visit: Payer: Medicaid Other | Admitting: Physician Assistant

## 2011-01-17 ENCOUNTER — Telehealth: Payer: Self-pay | Admitting: Cardiology

## 2011-01-17 NOTE — Telephone Encounter (Signed)
Pt daughter calling stating that pt is feeling really weak and tired and wanted to see MD.   Soonest appt with Dr. Antoine Poche in 11/27 and pt wanted to know if pt could see MD sooner. Please return call to discuss further.

## 2011-01-17 NOTE — Telephone Encounter (Signed)
Left message to call back.  May schedule with Tereso Newcomer if needed.

## 2011-01-18 NOTE — Telephone Encounter (Signed)
Left message to call back  

## 2011-01-20 NOTE — Telephone Encounter (Signed)
Have left several messages for family to call back with concerns.  Will wait until they call back since I have not been able to contact.

## 2011-01-25 ENCOUNTER — Ambulatory Visit (INDEPENDENT_AMBULATORY_CARE_PROVIDER_SITE_OTHER): Payer: Medicaid Other | Admitting: Cardiology

## 2011-01-25 ENCOUNTER — Encounter: Payer: Self-pay | Admitting: Cardiology

## 2011-01-25 DIAGNOSIS — I10 Essential (primary) hypertension: Secondary | ICD-10-CM

## 2011-01-25 DIAGNOSIS — I4892 Unspecified atrial flutter: Secondary | ICD-10-CM

## 2011-01-25 DIAGNOSIS — I509 Heart failure, unspecified: Secondary | ICD-10-CM

## 2011-01-25 MED ORDER — TORSEMIDE 20 MG PO TABS: 20.0000 mg | ORAL_TABLET | Freq: Every day | ORAL | Status: DC

## 2011-01-25 NOTE — Progress Notes (Signed)
HPI The patient is brought in today by her daughter for evaluation of shortness of breath. She has had some increased wheezing particularly at night. Her weight is up about 6 pounds since she was here in June. Her daughter astutely gave her increased Lasix for about 3 days last week without much improvement in her dyspnea. Her mom ambulates minimally with assistance. She is very slow and hasn't demonstrated any severe acute dyspnea. She sleeps with 3 pillows chronically. She only sleeps 4 hours every night. When she wakes up at 1 AM she says she feels "tired". She's not describing overt dyspnea or chest discomfort. She's had none of the tachycardia palpitations she was having previously. She's had no presyncope or syncope. Her blood pressures have been controlled at home with systolics in the 130s. She has some very mild lower strandy swelling. She has not had any fevers chills or cough.  No Known Allergies  Current Outpatient Prescriptions  Medication Sig Dispense Refill  . aspirin 81 MG tablet Take 81 mg by mouth daily.        . furosemide (LASIX) 40 MG tablet Take 40 mg by mouth daily.        . isosorbide mononitrate (IMDUR) 30 MG 24 hr tablet Take 30 mg by mouth daily.        . pindolol (VISKEN) 5 MG tablet Take 5 mg by mouth daily.         Past Medical History  Diagnosis Date  . Paroxysmal atrial flutter [facility] admission 2/5-04/20/2010    High fall risk-not a Coumadin candidate  . Hypertension   . Chronic diastolic heart failure     Echo February 2012: EF 55%; moderate LVH; mild AI; mild MR; PASP 39  . Sick sinus syndrome     Limits use of beta blocker-pindolol tolerated  . NSTEMI (non-ST elevated myocardial infarction)     Troponin 0.12, 0.13, 0.08 during admission 04/2010    No past surgical history on file.  ROS: As stated in the HPI and negative for all other systems. (Per her daughter.)  PHYSICAL EXAM BP 151/69  Pulse 85  Resp 16  Ht 4\' 10"  (1.473 m)  Wt 48.081 kg (106 lb)   BMI 22.15 kg/m2 PHYSICAL EXAM GEN:  No distress NECK:  No jugular venous distention at 90 degrees, waveform within normal limits, carotid upstroke brisk and symmetric, no bruits, no thyromegaly LYMPHATICS:  No cervical adenopathy LUNGS:  Clear to auscultation bilaterally BACK:  No CVA tenderness CHEST:  Unremarkable HEART:  S1 and S2 within normal limits, no S3, no S4, no clicks, no rubs, no murmurs ABD:  Positive bowel sounds normal in frequency in pitch, no bruits, no rebound, no guarding, unable to assess midline mass or bruit with the patient seated. EXT:  2 plus pulses throughout, moderate edema, no cyanosis no clubbing SKIN:  No rashes no nodules NEURO:  Cranial nerves II through XII grossly intact, motor grossly intact throughout PSYCH:  Cognitively intact, oriented to person place and time   ASSESSMENT AND PLAN

## 2011-01-25 NOTE — Assessment & Plan Note (Signed)
As above her blood pressure is well-controlled. She will otherwise remain on the meds as listed.

## 2011-01-25 NOTE — Assessment & Plan Note (Signed)
I will change to Torsemide and check a BNP and BMET today.  I will check a BMET in one week as well.

## 2011-01-25 NOTE — Patient Instructions (Signed)
Please have blood work done today: BMP and BNP  Your physician recommends that you return for lab work in: 7 days; BMET  Your physician recommends that you schedule a follow-up appointment in: 1 month  Your physician has recommended you make the following change in your medication: STOP taking furosemide and START taking Torsemide 20 mg, one tablet once daily

## 2011-01-25 NOTE — Assessment & Plan Note (Signed)
She has had no new tachycardia palpitations and no episodes of presyncope or syncope. She seems to be tolerating the meds as listed and these will continue.

## 2011-01-26 LAB — BASIC METABOLIC PANEL
CO2: 21 mEq/L (ref 19–32)
Chloride: 110 mEq/L (ref 96–112)
Glucose, Bld: 109 mg/dL — ABNORMAL HIGH (ref 70–99)
Sodium: 142 mEq/L (ref 135–145)

## 2011-02-01 ENCOUNTER — Ambulatory Visit: Payer: Medicaid Other | Admitting: *Deleted

## 2011-02-01 DIAGNOSIS — I10 Essential (primary) hypertension: Secondary | ICD-10-CM

## 2011-02-01 DIAGNOSIS — I509 Heart failure, unspecified: Secondary | ICD-10-CM

## 2011-02-01 LAB — BASIC METABOLIC PANEL
BUN: 31 mg/dL — ABNORMAL HIGH (ref 6–23)
CO2: 24 mEq/L (ref 19–32)
Glucose, Bld: 99 mg/dL (ref 70–99)
Potassium: 4.2 mEq/L (ref 3.5–5.3)
Sodium: 141 mEq/L (ref 135–145)

## 2011-02-28 ENCOUNTER — Encounter: Payer: Self-pay | Admitting: Cardiology

## 2011-02-28 ENCOUNTER — Ambulatory Visit (INDEPENDENT_AMBULATORY_CARE_PROVIDER_SITE_OTHER): Payer: Medicaid Other | Admitting: Cardiology

## 2011-02-28 VITALS — BP 118/62 | HR 81 | Wt 104.0 lb

## 2011-02-28 DIAGNOSIS — R0602 Shortness of breath: Secondary | ICD-10-CM

## 2011-02-28 DIAGNOSIS — I5031 Acute diastolic (congestive) heart failure: Secondary | ICD-10-CM

## 2011-02-28 DIAGNOSIS — I10 Essential (primary) hypertension: Secondary | ICD-10-CM

## 2011-02-28 DIAGNOSIS — I4892 Unspecified atrial flutter: Secondary | ICD-10-CM

## 2011-02-28 NOTE — Patient Instructions (Signed)
Please have blood work today  Follow up in 3 months with Dr Antoine Poche  The current medical regimen is effective;  continue present plan and medications.

## 2011-02-28 NOTE — Assessment & Plan Note (Signed)
The blood pressure is at target. No change in medications is indicated. We will continue with therapeutic lifestyle changes (TLC).  

## 2011-02-28 NOTE — Assessment & Plan Note (Signed)
She has had no symptomatic dysrhythmias. No change in therapy is indicated.

## 2011-02-28 NOTE — Assessment & Plan Note (Signed)
She seems to be better on torsemide. I will check a basic metabolic profile today. Her daughter will keep close check on her.

## 2011-02-28 NOTE — Progress Notes (Signed)
HPI The patient is brought in today by her daughter for evaluation of shortness of breath. When I last saw her she was having some wheezing and SOB.  I changed her to torsemide.  With this her breathing has been much improved. She has had no new weight gain and her swelling is improved. She's not having any new PND or orthopnea. She's having no new palpitations, presyncope or syncope.   No Known Allergies  Current Outpatient Prescriptions  Medication Sig Dispense Refill  . aspirin 81 MG tablet Take 81 mg by mouth daily.        . Fish Oil OIL Take 1 capsule by mouth daily.        . isosorbide mononitrate (IMDUR) 30 MG 24 hr tablet Take 30 mg by mouth daily.        Marland Kitchen Lysine 500 MG CAPS Take by mouth 2 (two) times daily.        . Melatonin 3 MG TABS Take by mouth at bedtime.        . pindolol (VISKEN) 5 MG tablet Take 5 mg by mouth daily.       Marland Kitchen torsemide (DEMADEX) 20 MG tablet Take 1 tablet (20 mg total) by mouth daily.  30 tablet  6    Past Medical History  Diagnosis Date  . Paroxysmal atrial flutter [facility] admission 2/5-04/20/2010    High fall risk-not a Coumadin candidate  . Hypertension   . Chronic diastolic heart failure     Echo February 2012: EF 55%; moderate LVH; mild AI; mild MR; PASP 39  . Sick sinus syndrome     Limits use of beta blocker-pindolol tolerated  . NSTEMI (non-ST elevated myocardial infarction)     Troponin 0.12, 0.13, 0.08 during admission 04/2010    No past surgical history on file.  ROS: As stated in the HPI and negative for all other systems. (Per her daughter.)  PHYSICAL EXAM BP 118/62  Pulse 81  Wt 104 lb (47.174 kg) PHYSICAL EXAM GEN:  No distress NECK:  No jugular venous distention at 90 degrees, waveform within normal limits, carotid upstroke brisk and symmetric, no bruits, no thyromegaly LYMPHATICS:  No cervical adenopathy LUNGS:  Clear to auscultation bilaterally BACK:  No CVA tenderness CHEST:  Unremarkable HEART:  S1 and S2 within normal  limits, no S3, no S4, no clicks, no rubs, no murmurs ABD:  Positive bowel sounds normal in frequency in pitch, no bruits, no rebound, no guarding, unable to assess midline mass or bruit with the patient seated. EXT:  2 plus pulses throughout, trace edema, no cyanosis no clubbing SKIN:  No rashes no nodules NEURO:  Cranial nerves II through XII grossly intact, motor grossly intact throughout PSYCH:  Cognitively intact, oriented to person place and time  EKG: Sinus rhythm, rate 81, nonspecific inferior T-wave changes which are unchanged from previous.    ASSESSMENT AND PLAN

## 2011-03-20 NOTE — Progress Notes (Signed)
Addended by: Sharin Grave on: 03/20/2011 05:18 PM   Modules accepted: Orders

## 2011-03-21 ENCOUNTER — Other Ambulatory Visit: Payer: Medicaid Other | Admitting: *Deleted

## 2011-03-24 ENCOUNTER — Emergency Department (HOSPITAL_COMMUNITY): Payer: Medicaid Other

## 2011-03-24 ENCOUNTER — Emergency Department (HOSPITAL_COMMUNITY)
Admission: EM | Admit: 2011-03-24 | Discharge: 2011-03-24 | Disposition: A | Discharge status: Home / Self Care | Payer: Medicaid Other | Attending: Emergency Medicine | Admitting: Emergency Medicine

## 2011-03-24 ENCOUNTER — Emergency Department (HOSPITAL_COMMUNITY)
Admission: EM | Admit: 2011-03-24 | Discharge: 2011-03-24 | Disposition: A | Discharge status: Home / Self Care | Payer: Medicaid Other | Source: Home / Self Care | Attending: Emergency Medicine | Admitting: Emergency Medicine

## 2011-03-24 ENCOUNTER — Encounter (HOSPITAL_COMMUNITY): Payer: Self-pay

## 2011-03-24 ENCOUNTER — Encounter (HOSPITAL_COMMUNITY): Payer: Self-pay | Admitting: *Deleted

## 2011-03-24 DIAGNOSIS — I252 Old myocardial infarction: Secondary | ICD-10-CM | POA: Insufficient documentation

## 2011-03-24 DIAGNOSIS — R1032 Left lower quadrant pain: Secondary | ICD-10-CM | POA: Insufficient documentation

## 2011-03-24 DIAGNOSIS — N390 Urinary tract infection, site not specified: Secondary | ICD-10-CM | POA: Insufficient documentation

## 2011-03-24 DIAGNOSIS — Z7982 Long term (current) use of aspirin: Secondary | ICD-10-CM | POA: Insufficient documentation

## 2011-03-24 DIAGNOSIS — I10 Essential (primary) hypertension: Secondary | ICD-10-CM | POA: Insufficient documentation

## 2011-03-24 DIAGNOSIS — R109 Unspecified abdominal pain: Secondary | ICD-10-CM

## 2011-03-24 DIAGNOSIS — Z79899 Other long term (current) drug therapy: Secondary | ICD-10-CM | POA: Insufficient documentation

## 2011-03-24 LAB — URINALYSIS, ROUTINE W REFLEX MICROSCOPIC
Bilirubin Urine: NEGATIVE
Glucose, UA: NEGATIVE mg/dL
Ketones, ur: NEGATIVE mg/dL
Nitrite: NEGATIVE
Protein, ur: 100 mg/dL — AB
Specific Gravity, Urine: 1.016 (ref 1.005–1.030)
Urobilinogen, UA: 1 mg/dL (ref 0.0–1.0)
pH: 5.5 (ref 5.0–8.0)

## 2011-03-24 LAB — COMPREHENSIVE METABOLIC PANEL
Alkaline Phosphatase: 87 U/L (ref 39–117)
BUN: 38 mg/dL — ABNORMAL HIGH (ref 6–23)
Creatinine, Ser: 1.44 mg/dL — ABNORMAL HIGH (ref 0.50–1.10)
GFR calc Af Amer: 35 mL/min — ABNORMAL LOW (ref >90)
Glucose, Bld: 110 mg/dL — ABNORMAL HIGH (ref 70–99)
Potassium: 3.3 mEq/L — ABNORMAL LOW (ref 3.5–5.1)
Total Bilirubin: 0.4 mg/dL (ref 0.3–1.2)
Total Protein: 7.8 g/dL (ref 6.0–8.3)

## 2011-03-24 LAB — URINE MICROSCOPIC-ADD ON

## 2011-03-24 LAB — CBC
HCT: 35.6 % — ABNORMAL LOW (ref 36.0–46.0)
Hemoglobin: 11.9 g/dL — ABNORMAL LOW (ref 12.0–15.0)
MCH: 29.8 pg (ref 26.0–34.0)
MCHC: 33.4 g/dL (ref 30.0–36.0)
MCV: 89.2 fL (ref 78.0–100.0)
Platelets: 291 K/uL (ref 150–400)
RBC: 3.99 MIL/uL (ref 3.87–5.11)
RDW: 11.6 % (ref 11.5–15.5)
WBC: 5.4 K/uL (ref 4.0–10.5)

## 2011-03-24 MED ORDER — SODIUM CHLORIDE 0.9 % IV BOLUS (SEPSIS)
250.0000 mL | Freq: Once | INTRAVENOUS | Status: DC
  Administered 2011-03-24: 250 mL via INTRAVENOUS

## 2011-03-24 MED ORDER — SODIUM CHLORIDE 0.9 % IV BOLUS (SEPSIS)
175.0000 mL | Freq: Once | INTRAVENOUS | Status: AC
  Administered 2011-03-24: 175 mL via INTRAVENOUS

## 2011-03-24 MED ORDER — CIPROFLOXACIN HCL 500 MG PO TABS: 500.0000 mg | ORAL_TABLET | Freq: Two times a day (BID) | ORAL | Status: AC

## 2011-03-24 MED ORDER — MORPHINE SULFATE 2 MG/ML IJ SOLN
2.0000 mg | Freq: Once | INTRAMUSCULAR | Status: AC
  Administered 2011-03-24: 2 mg via INTRAVENOUS
  Filled 2011-03-24: qty 1

## 2011-03-24 MED ORDER — ONDANSETRON HCL 4 MG/2ML IJ SOLN
4.0000 mg | Freq: Once | INTRAMUSCULAR | Status: AC
  Administered 2011-03-24: 4 mg via INTRAVENOUS
  Filled 2011-03-24: qty 2

## 2011-03-24 NOTE — ED Provider Notes (Addendum)
History     CSN: [id number]  Arrival date & time 03/24/11  1045   First MD Initiated Contact with Patient 03/24/11 1047      Chief Complaint  Patient presents with  . Abdominal Pain    (Consider location/radiation/quality/duration/timing/severity/associated sxs/prior treatment) HPI Comments: For about 3 days, been hurting on her left side" its getting worse, hurts to walk and move" No diarrheas, No fevers, No vomiting. Last Bm was this morning small. No urinary symptoms.  Patient is a 78 y.o. female presenting with abdominal pain. The history is provided by the patient. The history is limited by a language barrier. A language interpreter was used.  Abdominal Pain The primary symptoms of the illness include abdominal pain. The primary symptoms of the illness do not include fever, fatigue, shortness of breath, nausea, vomiting, hematemesis, dysuria or vaginal bleeding. The current episode started 2 days ago. The onset of the illness was gradual. The problem has been gradually worsening.  Additional symptoms associated with the illness include anorexia and constipation. Symptoms associated with the illness do not include diaphoresis, urgency, frequency or back pain.    Past Medical History  Diagnosis Date  . Paroxysmal atrial flutter [facility] admission 2/5-04/20/2010    High fall risk-not a Coumadin candidate  . Hypertension   . Chronic diastolic heart failure     Echo February 2012: EF 55%; moderate LVH; mild AI; mild MR; PASP 39  . Sick sinus syndrome     Limits use of beta blocker-pindolol tolerated  . NSTEMI (non-ST elevated myocardial infarction)     Troponin 0.12, 0.13, 0.08 during admission 04/2010    History reviewed. No pertinent past surgical history.  No family history on file.  History  Substance Use Topics  . Smoking status: Never Smoker   . Smokeless tobacco: Not on file  . Alcohol Use: No    OB History    Grav Para Term Preterm Abortions TAB SAB Ect Mult Living                 Review of Systems  Constitutional: Positive for appetite change. Negative for fever, diaphoresis and fatigue.  Respiratory: Negative for shortness of breath.   Gastrointestinal: Positive for abdominal pain, constipation and anorexia. Negative for nausea, vomiting and hematemesis.  Genitourinary: Negative for dysuria, urgency, frequency and vaginal bleeding.  Musculoskeletal: Negative for back pain.    Allergies  Review of patient's allergies indicates no known allergies.  Home Medications   Current Outpatient Rx  Name Route Sig Dispense Refill  . ASPIRIN 81 MG PO TABS Oral Take 81 mg by mouth daily.      Marland Kitchen FISH OIL OIL Oral Take 1 capsule by mouth daily.      . ISOSORBIDE MONONITRATE ER 30 MG PO TB24 Oral Take 30 mg by mouth daily.      Marland Kitchen LYSINE 500 MG PO CAPS Oral Take by mouth 2 (two) times daily.      Marland Kitchen MELATONIN 3 MG PO TABS Oral Take by mouth at bedtime.      Marland Kitchen PINDOLOL 5 MG PO TABS Oral Take 5 mg by mouth daily.     . TORSEMIDE 20 MG PO TABS Oral Take 1 tablet (20 mg total) by mouth daily. 30 tablet 6    BP 128/71  Pulse 86  Temp(Src) 98.2 F (36.8 C) (Oral)  Resp 20  SpO2 96%  Physical Exam  Nursing note and vitals reviewed. Constitutional: She appears well-nourished.  Eyes: Conjunctivae are normal. No  scleral icterus.  Pulmonary/Chest: Effort normal and breath sounds normal.  Abdominal: Soft. There is tenderness in the periumbilical area, left upper quadrant and left lower quadrant. There is no rebound.    Skin: No rash noted. She is not diaphoretic.    ED Course  Procedures (including critical care time)   Labs Reviewed  POCT URINALYSIS DIPSTICK   No results found.   1. Abdominal pain       MDM  L periumbilical and LLQ abdominal pain-X 3 DAYS. Patient in considerable discomfort, considering intra-badominal condition, transferred stable to the ED for further evaluation, considering many etiologies.        Jimmie Molly,  MD 03/24/11 1239  Jimmie Molly, MD 03/24/11 1239

## 2011-03-24 NOTE — ED Notes (Signed)
Family at bedside. 

## 2011-03-24 NOTE — ED Notes (Signed)
C/o lt sided abdominal pain that she describes as cramping and sharp for 3 days.  Denies n/v, diarrhea or fever.  Daughter reports hard small BM this am.  States she has problems with constipation but this pain is not similar to constipation pain/sx.  Denies urinary sx.

## 2011-03-24 NOTE — ED Provider Notes (Signed)
History     CSN: [id number]  Arrival date & time 03/24/11  1238   First MD Initiated Contact with Patient 03/24/11 1249      Chief Complaint  Patient presents with  . Abdominal Pain    (Consider location/radiation/quality/duration/timing/severity/associated sxs/prior treatment) HPI  Pt presents to the ED from the Urgent Care with complaints of abdominal pain. That is left lower quadrant that is increasingly getting worse. The patient has a history of abdominal pain but it typically does not persist as this long. The patient denies diarrhea, fevers, vomiting, weakness. The patient had a bowel movement yesterday but it was small. Pt denies urinary symptoms.  Past Medical History  Diagnosis Date  . Paroxysmal atrial flutter [facility] admission 2/5-04/20/2010    High fall risk-not a Coumadin candidate  . Hypertension   . Chronic diastolic heart failure     Echo February 2012: EF 55%; moderate LVH; mild AI; mild MR; PASP 39  . Sick sinus syndrome     Limits use of beta blocker-pindolol tolerated  . NSTEMI (non-ST elevated myocardial infarction)     Troponin 0.12, 0.13, 0.08 during admission 04/2010    History reviewed. No pertinent past surgical history.  History reviewed. No pertinent family history.  History  Substance Use Topics  . Smoking status: Never Smoker   . Smokeless tobacco: Not on file  . Alcohol Use: No    OB History    Grav Para Term Preterm Abortions TAB SAB Ect Mult Living                  Review of Systems  All other systems reviewed and are negative.    Allergies  Review of patient's allergies indicates no known allergies.  Home Medications   Current Outpatient Rx  Name Route Sig Dispense Refill  . ASPIRIN 81 MG PO CHEW Oral Chew 81 mg by mouth daily.    . ISOSORBIDE MONONITRATE ER 30 MG PO TB24 Oral Take 30 mg by mouth daily.     Marland Kitchen LYSINE 500 MG PO CAPS Oral Take 500 mg by mouth 2 (two) times daily.     Marland Kitchen MELATONIN 3 MG PO TABS Oral Take 3 mg by  mouth at bedtime.     Marland Kitchen FISH OIL PO Oral Take 1 capsule by mouth daily.    Marland Kitchen PINDOLOL 5 MG PO TABS Oral Take 2.5 mg by mouth 2 (two) times daily.     . TORSEMIDE 20 MG PO TABS Oral Take 20 mg by mouth daily.      BP 117/55  Pulse 83  Temp(Src) 98.6 F (37 C) (Oral)  Resp 15  SpO2 96%  Physical Exam  Constitutional: She appears well-developed and well-nourished.  HENT:  Head: Normocephalic and atraumatic.  Eyes: Conjunctivae are normal. Pupils are equal, round, and reactive to light.  Neck: Trachea normal, normal range of motion and full passive range of motion without pain. Neck supple.  Cardiovascular: Normal rate, regular rhythm and normal pulses.   Pulmonary/Chest: Effort normal and breath sounds normal. Chest wall is not dull to percussion. She exhibits no tenderness, no crepitus, no edema, no deformity and no retraction.  Abdominal: Soft. Normal appearance and bowel sounds are normal. She exhibits no distension. There is no tenderness. There is no rebound and no guarding.  Genitourinary: Rectum normal.  Musculoskeletal: Normal range of motion.  Lymphadenopathy:       Head (right side): No submental, no submandibular, no tonsillar, no preauricular, no posterior auricular and  no occipital adenopathy present.       Head (left side): No submental, no submandibular, no tonsillar, no preauricular, no posterior auricular and no occipital adenopathy present.    She has no cervical adenopathy.    She has no axillary adenopathy.  Neurological: She is alert. She has normal strength.  Skin: Skin is warm, dry and intact.  Psychiatric: Her speech is normal. Cognition and memory are normal.    ED Course  Procedures (including critical care time)  Labs Reviewed  URINALYSIS, ROUTINE W REFLEX MICROSCOPIC - Abnormal; Notable for the following:    APPearance TURBID (*)    Hgb urine dipstick LARGE (*)    Protein, ur 100 (*)    Leukocytes, UA LARGE (*)    All other components within normal  limits  COMPREHENSIVE METABOLIC PANEL - Abnormal; Notable for the following:    Sodium 132 (*)    Potassium 3.3 (*)    Glucose, Bld 110 (*)    BUN 38 (*)    Creatinine, Ser 1.44 (*)    Albumin 2.6 (*)    GFR calc non Af Amer 30 (*)    GFR calc Af Amer 35 (*)    All other components within normal limits  CBC - Abnormal; Notable for the following:    Hemoglobin 11.9 (*)    HCT 35.6 (*)    All other components within normal limits  URINE MICROSCOPIC-ADD ON - Abnormal; Notable for the following:    Bacteria, UA MANY (*)    Casts GRANULAR CAST (*)    All other components within normal limits  URINE CULTURE  POCT OCCULT BLOOD STOOL, DEVICE   Dg Abd 1 View  03/24/2011  *RADIOLOGY REPORT*  Clinical Data: Abdominal pain.  ABDOMEN - 1 VIEW  Comparison: None.  Findings: The bowel gas pattern is unremarkable.  No unexpected abdominal calcification.  L2 compression fracture deformity is noted.  IMPRESSION: Normal appearing bowel gas pattern.  Original Report Authenticated By: Bernadene Bell. D'ALESSIO, M.D.     1. UTI (lower urinary tract infection)       MDM  KIB negative, occult stool negative. Pt given cipro for the UTI and has been told to follow-up with her PCP for close follow-up. Pt to return to the ED if symptoms worsen or do not resolve ian few days.        Dorthula Matas, PA 03/24/11 1558

## 2011-03-24 NOTE — ED Provider Notes (Signed)
Medical screening examination/treatment/procedure(s) were conducted as a shared visit with non-physician practitioner(s) and myself.  I personally evaluated the patient during the encounter Patient with abdominal pain today however her abdomen is soft and nontender on exam there is no fecal impaction in acute abdominal series negative for constipation. UA significant for a UTI. Patient is otherwise well appearing with normal vital signs she is tolerating by mouth's and will discharge home with antibiotic  Gwyneth Sprout, MD 03/24/11 1601

## 2011-03-24 NOTE — ED Notes (Signed)
Unable to void

## 2011-03-24 NOTE — ED Notes (Signed)
To ed for eval of llq pain and vomitng. Sent from [facility]

## 2011-03-27 ENCOUNTER — Other Ambulatory Visit: Payer: Medicaid Other | Admitting: *Deleted

## 2011-04-06 ENCOUNTER — Ambulatory Visit: Payer: Medicaid Other | Admitting: Family Medicine

## 2011-04-13 ENCOUNTER — Other Ambulatory Visit: Payer: Self-pay | Admitting: *Deleted

## 2011-04-13 ENCOUNTER — Other Ambulatory Visit: Payer: Self-pay | Admitting: Cardiology

## 2011-04-13 MED ORDER — PINDOLOL 5 MG PO TABS: 2.5000 mg | ORAL_TABLET | Freq: Two times a day (BID) | ORAL | Status: DC

## 2011-04-13 NOTE — Telephone Encounter (Signed)
FU Call: Pt calling wanting to check on status of pt refill. Pt only has one pill left. Please return pt daughter call to discuss further and/or call in medication.   Please not pt preferred pharmacy was updated. Pt would like RX called into [facility] on [address].

## 2011-04-14 ENCOUNTER — Other Ambulatory Visit: Payer: Self-pay

## 2011-04-14 ENCOUNTER — Emergency Department (HOSPITAL_COMMUNITY): Payer: Medicaid Other

## 2011-04-14 ENCOUNTER — Inpatient Hospital Stay (HOSPITAL_COMMUNITY)
Admission: EM | Admit: 2011-04-14 | Discharge: 2011-04-20 | DRG: 308 | Disposition: A | Discharge status: Home / Self Care | Payer: Medicaid Other | Attending: Cardiology | Admitting: Cardiology

## 2011-04-14 ENCOUNTER — Telehealth: Payer: Self-pay | Admitting: Cardiology

## 2011-04-14 ENCOUNTER — Encounter (HOSPITAL_COMMUNITY): Payer: Self-pay | Admitting: Emergency Medicine

## 2011-04-14 DIAGNOSIS — N1330 Unspecified hydronephrosis: Secondary | ICD-10-CM | POA: Diagnosis present

## 2011-04-14 DIAGNOSIS — E871 Hypo-osmolality and hyponatremia: Secondary | ICD-10-CM | POA: Diagnosis present

## 2011-04-14 DIAGNOSIS — I5031 Acute diastolic (congestive) heart failure: Secondary | ICD-10-CM

## 2011-04-14 DIAGNOSIS — R109 Unspecified abdominal pain: Secondary | ICD-10-CM

## 2011-04-14 DIAGNOSIS — I2510 Atherosclerotic heart disease of native coronary artery without angina pectoris: Secondary | ICD-10-CM | POA: Diagnosis present

## 2011-04-14 DIAGNOSIS — Z7982 Long term (current) use of aspirin: Secondary | ICD-10-CM

## 2011-04-14 DIAGNOSIS — Z79899 Other long term (current) drug therapy: Secondary | ICD-10-CM

## 2011-04-14 DIAGNOSIS — K209 Esophagitis, unspecified without bleeding: Secondary | ICD-10-CM | POA: Diagnosis present

## 2011-04-14 DIAGNOSIS — F419 Anxiety disorder, unspecified: Secondary | ICD-10-CM

## 2011-04-14 DIAGNOSIS — I10 Essential (primary) hypertension: Secondary | ICD-10-CM

## 2011-04-14 DIAGNOSIS — I4892 Unspecified atrial flutter: Principal | ICD-10-CM | POA: Diagnosis present

## 2011-04-14 DIAGNOSIS — I509 Heart failure, unspecified: Secondary | ICD-10-CM | POA: Diagnosis present

## 2011-04-14 DIAGNOSIS — I5033 Acute on chronic diastolic (congestive) heart failure: Secondary | ICD-10-CM | POA: Diagnosis present

## 2011-04-14 DIAGNOSIS — F411 Generalized anxiety disorder: Secondary | ICD-10-CM | POA: Diagnosis present

## 2011-04-14 DIAGNOSIS — I2119 ST elevation (STEMI) myocardial infarction involving other coronary artery of inferior wall: Secondary | ICD-10-CM

## 2011-04-14 DIAGNOSIS — I5032 Chronic diastolic (congestive) heart failure: Secondary | ICD-10-CM

## 2011-04-14 DIAGNOSIS — I495 Sick sinus syndrome: Secondary | ICD-10-CM

## 2011-04-14 HISTORY — DX: Anxiety disorder, unspecified: F41.9

## 2011-04-14 HISTORY — DX: Unspecified osteoarthritis, unspecified site: M19.90

## 2011-04-14 LAB — PROTIME-INR
INR: 1.15 (ref 0.00–1.49)
Prothrombin Time: 14.9 seconds (ref 11.6–15.2)

## 2011-04-14 LAB — COMPREHENSIVE METABOLIC PANEL
ALT: 11 U/L (ref 0–35)
AST: 17 U/L (ref 0–37)
Calcium: 9.1 mg/dL (ref 8.4–10.5)
GFR calc Af Amer: 36 mL/min — ABNORMAL LOW (ref >90)
Glucose, Bld: 140 mg/dL — ABNORMAL HIGH (ref 70–99)
Sodium: 138 mEq/L (ref 135–145)
Total Protein: 8.3 g/dL (ref 6.0–8.3)

## 2011-04-14 LAB — CBC
MCH: 28.7 pg (ref 26.0–34.0)
MCHC: 31.9 g/dL (ref 30.0–36.0)
MCV: 90.1 fL (ref 78.0–100.0)
Platelets: 467 10*3/uL — ABNORMAL HIGH (ref 150–400)
RDW: 12.6 % (ref 11.5–15.5)

## 2011-04-14 LAB — URINALYSIS, ROUTINE W REFLEX MICROSCOPIC
Bilirubin Urine: NEGATIVE
Nitrite: NEGATIVE
Specific Gravity, Urine: 1.017 (ref 1.005–1.030)
pH: 6.5 (ref 5.0–8.0)

## 2011-04-14 LAB — CARDIAC PANEL(CRET KIN+CKTOT+MB+TROPI)
CK, MB: 1.8 ng/mL (ref 0.3–4.0)
CK, MB: 1.8 ng/mL (ref 0.3–4.0)
Total CK: 40 U/L (ref 7–177)
Troponin I: <0.3 ng/mL (ref <0.30)
Troponin I: <0.3 ng/mL (ref <0.30)

## 2011-04-14 LAB — DIFFERENTIAL
Basophils Absolute: 0 10*3/uL (ref 0.0–0.1)
Eosinophils Absolute: 0.1 10*3/uL (ref 0.0–0.7)
Eosinophils Relative: 2 % (ref 0–5)

## 2011-04-14 LAB — HEPARIN LEVEL (UNFRACTIONATED): Heparin Unfractionated: <0.1 [IU]/mL — ABNORMAL LOW (ref 0.30–0.70)

## 2011-04-14 LAB — APTT: aPTT: 62 seconds — ABNORMAL HIGH (ref 24–37)

## 2011-04-14 LAB — URINE MICROSCOPIC-ADD ON

## 2011-04-14 MED ORDER — HEPARIN SODIUM (PORCINE) 5000 UNIT/ML IJ SOLN
INTRAMUSCULAR | Status: AC
  Administered 2011-04-14: 4000 [IU]
  Filled 2011-04-14: qty 1

## 2011-04-14 MED ORDER — NITROGLYCERIN 0.4 MG SL SUBL: 0.4000 mg | SUBLINGUAL_TABLET | SUBLINGUAL | Status: DC | PRN

## 2011-04-14 MED ORDER — HEPARIN SOD (PORCINE) IN D5W 100 UNIT/ML IV SOLN
1100.0000 [IU]/h | INTRAVENOUS | Status: DC
  Administered 2011-04-14: 600 [IU]/h via INTRAVENOUS
  Administered 2011-04-14: 500 [IU]/h via INTRAVENOUS
  Administered 2011-04-15: 1000 [IU]/h via INTRAVENOUS
  Administered 2011-04-15: 800 [IU]/h via INTRAVENOUS
  Filled 2011-04-14 (×4): qty 250

## 2011-04-14 MED ORDER — PINDOLOL 5 MG PO TABS
2.5000 mg | ORAL_TABLET | Freq: Two times a day (BID) | ORAL | Status: DC
  Administered 2011-04-14 – 2011-04-15 (×2): 2.5 mg via ORAL
  Administered 2011-04-15: 21:00:00 via ORAL
  Administered 2011-04-16: 2.5 mg via ORAL
  Administered 2011-04-16: 22:00:00 via ORAL
  Administered 2011-04-17 – 2011-04-18 (×3): 2.5 mg via ORAL
  Administered 2011-04-18: 10:00:00 via ORAL
  Administered 2011-04-19 – 2011-04-20 (×2): 2.5 mg via ORAL
  Filled 2011-04-14 (×13): qty 1

## 2011-04-14 MED ORDER — NITROGLYCERIN IN D5W 200-5 MCG/ML-% IV SOLN: 3.0000 ug/min | INTRAVENOUS | Status: DC

## 2011-04-14 MED ORDER — ASPIRIN 81 MG PO CHEW
CHEWABLE_TABLET | ORAL | Status: AC
  Administered 2011-04-14: 324 mg
  Filled 2011-04-14: qty 4

## 2011-04-14 MED ORDER — SODIUM CHLORIDE 0.9 % IV SOLN: 250.0000 mL | INTRAVENOUS | Status: DC | PRN

## 2011-04-14 MED ORDER — ASPIRIN 81 MG PO CHEW
81.0000 mg | CHEWABLE_TABLET | Freq: Every day | ORAL | Status: DC
  Administered 2011-04-15 – 2011-04-18 (×4): 81 mg via ORAL
  Administered 2011-04-19: 81 mg
  Administered 2011-04-20: 81 mg via ORAL
  Filled 2011-04-14 (×2): qty 1

## 2011-04-14 MED ORDER — FUROSEMIDE 10 MG/ML IJ SOLN
40.0000 mg | Freq: Two times a day (BID) | INTRAMUSCULAR | Status: DC
  Administered 2011-04-14 – 2011-04-17 (×6): 40 mg via INTRAVENOUS
  Filled 2011-04-14 (×8): qty 4

## 2011-04-14 MED ORDER — SODIUM CHLORIDE 0.9 % IJ SOLN
3.0000 mL | Freq: Two times a day (BID) | INTRAMUSCULAR | Status: DC
Start: 2011-04-14 — End: 2011-04-20
  Administered 2011-04-15 – 2011-04-18 (×8): 3 mL via INTRAVENOUS
  Administered 2011-04-19: 3 mL
  Administered 2011-04-19: 3 mL via INTRAVENOUS

## 2011-04-14 MED ORDER — HEPARIN BOLUS VIA INFUSION
4000.0000 [IU] | Freq: Once | INTRAVENOUS | Status: AC
  Administered 2011-04-14: 4000 [IU] via INTRAVENOUS

## 2011-04-14 MED ORDER — OMEGA-3-ACID ETHYL ESTERS 1 G PO CAPS
1.0000 g | ORAL_CAPSULE | Freq: Every day | ORAL | Status: DC
  Administered 2011-04-14 – 2011-04-20 (×6): 1 g via ORAL
  Filled 2011-04-14 (×7): qty 1

## 2011-04-14 MED ORDER — ONDANSETRON HCL 4 MG/2ML IJ SOLN: 4.0000 mg | Freq: Four times a day (QID) | INTRAMUSCULAR | Status: DC | PRN

## 2011-04-14 MED ORDER — SODIUM CHLORIDE 0.9 % IJ SOLN: 3.0000 mL | INTRAMUSCULAR | Status: DC | PRN

## 2011-04-14 MED ORDER — ASPIRIN 81 MG PO CHEW
324.0000 mg | CHEWABLE_TABLET | ORAL | Status: AC
  Filled 2011-04-14: qty 1

## 2011-04-14 MED ORDER — ACETAMINOPHEN 325 MG PO TABS
650.0000 mg | ORAL_TABLET | ORAL | Status: DC | PRN
  Administered 2011-04-15 – 2011-04-19 (×4): 650 mg via ORAL
  Filled 2011-04-14 (×4): qty 2

## 2011-04-14 MED ORDER — HEPARIN BOLUS VIA INFUSION
4000.0000 [IU] | Freq: Once | INTRAVENOUS | Status: AC
  Administered 2011-04-14: 4000 [IU] via INTRAVENOUS
  Filled 2011-04-14: qty 4000

## 2011-04-14 MED ORDER — SODIUM CHLORIDE 0.9 % IV SOLN
INTRAVENOUS | Status: DC
  Administered 2011-04-14: 10:00:00 via INTRAVENOUS

## 2011-04-14 NOTE — H&P (Signed)
CARDIOLOGY ADMISSION NOTE  Patient ID: Yvette [name] MRN: [id number] DOB/AGE: 16-Aug-1916 78 y.o.  Admit date: 04/14/2011 Primary Physician    Primary Cardiologist   Dr. Antoine Poche Chief Complaint    Chest pain  HPI:  The patient presents for evaluation of acute dyspnea. She has a history of diastolic dysfunction and paroxysmal atrial flutter. She's had a non-Q-wave myocardial infarction the we have managed this conservatively. She had been doing relatively well at home. She ambulates house. She is for but appropriate for age. She's had a recent urinary infection and completed a long course of antibiotics. She's also had some abdominal discomfort and her daughter who is her interpreter describes left lower quadrant discomfort. She does not report diarrhea constipation or GI bleeding. Her daughter does report that she's been weaker recently. She's had a decreased appetite. She's had some dyspnea particularly at night and is having to sleep aggressively on more pillows. She's not describing cough fevers or chills. Her weight has been stable. There has been no edema. Today she describes some chest pressure. She did not describe pain. She had no jaw or arm discomfort. She was acutely more short of breath. Her heart rate is 110 when taken by her daughter. She also was so weak that she fell and she reported being cold all over. She was transported to the ER and we are seeing her acutely.  Her EKG does demonstrate some inferior ST elevation. This is slightly more pronounced [place]nn an old EKG and may be related to her increased rate. She does have more pronounced lateral T-wave inversions compared to previous. She does have voltage criteria for LVH. She was initially called an acute myocardial infarction. However, after I valuated her in the ER and found her to be pain-free with only subtle new EKG changes, and given her advanced age, after discussion with the daughter she will not be taken urgently to the cath lab.  She's oxygenating well and much more comfortable. She is being transferred to the stepdown unit.   Past Medical History  Diagnosis Date  . Paroxysmal atrial flutter [facility] admission 2/5-04/20/2010    High fall risk-not a Coumadin candidate  . Hypertension   . Chronic diastolic heart failure     Echo February 2012: EF 55%; moderate LVH; mild AI; mild MR; PASP 39  . Sick sinus syndrome     Limits use of beta blocker-pindolol tolerated  . NSTEMI (non-ST elevated myocardial infarction)     Troponin 0.12, 0.13, 0.08 during admission 04/2010    History reviewed. No pertinent past surgical history.  No Known Allergies No current facility-administered medications on file prior to encounter.   Current Outpatient Prescriptions on File Prior to Encounter  Medication Sig Dispense Refill  . aspirin 81 MG chewable tablet Chew 81 mg by mouth daily.      . isosorbide mononitrate (IMDUR) 30 MG 24 hr tablet Take 30 mg by mouth daily.       Marland Kitchen Lysine 500 MG CAPS Take 500 mg by mouth 2 (two) times daily.       . Melatonin 3 MG TABS Take 3 mg by mouth at bedtime.       . Omega-3 Fatty Acids (FISH OIL PO) Take 1 capsule by mouth daily.      . pindolol (VISKEN) 5 MG tablet Take 0.5 tablets (2.5 mg total) by mouth 2 (two) times daily.  90 tablet  3  . torsemide (DEMADEX) 20 MG tablet Take 20 mg by mouth daily.  History   Social History  . Marital Status: Widowed    Spouse Name: N/A    Number of Children: N/A  . Years of Education: N/A   Occupational History  . Not on file.   Social History Main Topics  . Smoking status: Never Smoker   . Smokeless tobacco: Not on file  . Alcohol Use: No  . Drug Use: No  . Sexually Active: Not on file   Other Topics Concern  . Not on file   Social History Narrative  . No narrative on file    No family history on file.  ROS:  As stated in the HPI and negative for all other systems.  Physical Exam: Blood pressure 113/56, pulse 97, temperature 98.1 F (36.7  C), resp. rate 21.  GENERAL:  Frail and appearing HEENT:  Pupils equal round and reactive, fundi not visualized, oral mucosa unremarkable NECK:  No jugular venous distention, waveform within normal limits, carotid upstroke brisk and symmetric, no bruits, no thyromegaly LYMPHATICS:  No cervical, inguinal adenopathy LUNGS:  A few bilateral basilar crackles and   BACK:  No CVA tenderness CHEST:  Unremarkable HEART:  PMI not displaced or sustained,S1 and S2 within normal limits, no S3, no S4, no clicks, no rubs, no murmurs ABD:  Flat, positive bowel sounds normal in frequency in pitch, no bruits, no rebound, no guarding, no midline pulsatile mass, no hepatomegaly, no splenomegaly EXT:  2 plus pulses throughout, no edema, no cyanosis no clubbing SKIN:  No rashes no nodules NEURO:  Cranial nerves II through XII grossly intact, motor grossly intact throughout PSYCH:  Cognitively intact, oriented to person place and time  Labs:   Pending  Radiology:  CXR  Cardiomegaly. Asymmetric left greater [place] right basilar airspace disease and atelectasis. Favor asymmetric pulmonary edema over aspiration or pneumonia. Underlying emphysema.   EKG:  Sinus rhythm, rate 93, inferior ST elevation slightly more pronounced [place] previous with questionable injury pattern, lateral T-wave inversions more pronounced in previous. Early transition in lead V2. 04/14/2011  Assessment/Plan  1)  Dyspnea:   The patient has acute dyspnea which likely represents some pulmonary edema. She's much more comfortable currently. I will admit her and treat her with low-dose diuretics. She'll get IV nitroglycerin. She does have some acute EKG changes though these may well be rate related as the ST elevation and T-wave inversions were evident a lesser degree on a previous EKG. Regardless she's not having any chest pain. Given this and her advanced age she will not be taken urgently to the cath lab. Enzymes will be cycled. She will be treated  with heparin and aspirin however.   2)  UTI:  The patient had a recent UTI and is having some failure to thrive compared to her baseline. We will check a UA. We will also continue to follow abdominal exams and any change in bowel habits a she's been having some abdominal complaint.  3)  Flutter:  The patient is a currently in sinus rhythm. No change in therapy is indicated.   4)  Diastolic Dysfunction:  This will be managed as above.  Rollene Rotunda 04/14/2011, 10:44 AM

## 2011-04-14 NOTE — ED Notes (Signed)
Cath team to the bedside

## 2011-04-14 NOTE — ED Notes (Signed)
Heart rate back down to 86 NSR.   Daughter at bedside

## 2011-04-14 NOTE — ED Notes (Signed)
Code Stemi called

## 2011-04-14 NOTE — ED Notes (Signed)
Attempted to get urine, put PT on bedpan but PT could not go. Will try again in a little while.

## 2011-04-14 NOTE — ED Notes (Signed)
MD at bedside. 

## 2011-04-14 NOTE — ED Provider Notes (Addendum)
History     CSN: [id number]  Arrival date & time 04/14/11  4696   First MD Initiated Contact with Patient 04/14/11 1011      Chief Complaint  Patient presents with  . Shortness of Breath    (Consider location/radiation/quality/duration/timing/severity/associated sxs/prior treatment) HPI Comments: Pt with acute inferior MI.  Speaks only [place]  Unable to use translator phones to take Hx or ROS due to urgency of situation.  Patient is a 78 y.o. female presenting with shortness of breath. The history is provided by a relative. No language interpreter was used.  Shortness of Breath  The current episode started today (Patient's daughter says that the patient had onset of shortness of breath around 6:15 AM. She had palpitations and nearly passed out. The daughter called Dr. Ivette Loyal Klein's office, advised her to bring her immediately to the emergency department. She has a p). The problem occurs continuously. The problem has been unchanged. The problem is severe. The symptoms are relieved by nothing. The symptoms are aggravated by nothing. Associated symptoms include shortness of breath. Recent Medical Care: She is followed in the office by Dr. Antoine Poche, cardiologist, for CHF.    Past Medical History  Diagnosis Date  . Paroxysmal atrial flutter [facility] admission 2/5-04/20/2010    High fall risk-not a Coumadin candidate  . Hypertension   . Chronic diastolic heart failure     Echo February 2012: EF 55%; moderate LVH; mild AI; mild MR; PASP 39  . Sick sinus syndrome     Limits use of beta blocker-pindolol tolerated  . NSTEMI (non-ST elevated myocardial infarction)     Troponin 0.12, 0.13, 0.08 during admission 04/2010    History reviewed. No pertinent past surgical history.  No family history on file.  History  Substance Use Topics  . Smoking status: Never Smoker   . Smokeless tobacco: Not on file  . Alcohol Use: No    OB History    Grav Para Term Preterm Abortions TAB SAB Ect Mult  Living                  Review of Systems  Unable to perform ROS Respiratory: Positive for shortness of breath.     Allergies  Review of patient's allergies indicates no known allergies.  Home Medications   Current Outpatient Rx  Name Route Sig Dispense Refill  . ASPIRIN 81 MG PO CHEW Oral Chew 81 mg by mouth daily.    . ISOSORBIDE MONONITRATE ER 30 MG PO TB24 Oral Take 30 mg by mouth daily.     Marland Kitchen LYSINE 500 MG PO CAPS Oral Take 500 mg by mouth 2 (two) times daily.     Marland Kitchen MELATONIN 3 MG PO TABS Oral Take 3 mg by mouth at bedtime.     Marland Kitchen FISH OIL PO Oral Take 1 capsule by mouth daily.    Marland Kitchen PINDOLOL 5 MG PO TABS Oral Take 0.5 tablets (2.5 mg total) by mouth 2 (two) times daily. 90 tablet 3  . TORSEMIDE 20 MG PO TABS Oral Take 20 mg by mouth daily.      BP 114/53  Pulse 97  Temp 98.1 F (36.7 C)  Resp 16  Physical Exam  Constitutional: She is oriented to person, place, and time.       Slender elderly lady in moderate distress with shortness of breath.  HENT:  Head: Normocephalic and atraumatic.  Eyes: Conjunctivae and EOM are normal. Pupils are equal, round, and reactive to light.  Neck: Normal  range of motion. Neck supple. No JVD present.  Cardiovascular: Normal rate, regular rhythm and normal heart sounds.   Pulmonary/Chest: Effort normal and breath sounds normal.  Abdominal: Soft. Bowel sounds are normal.  Musculoskeletal: Normal range of motion.  Lymphadenopathy:    She has no cervical adenopathy.  Neurological: She is alert and oriented to person, place, and time.       No apparent sensory motor loss.  Skin: Skin is warm and dry.  Psychiatric: She has a normal mood and affect. Her behavior is normal.    ED Course  CRITICAL CARE Performed by: Osvaldo Human Authorized by: Osvaldo Human Total critical care time: 30 minutes Critical care was necessary to treat or prevent imminent or life-threatening deterioration of the following conditions: Acute  inferior myocardial infarction. Critical care was time spent personally by me on the following activities: development of treatment plan with patient or surrogate, discussions with consultants, evaluation of patient's response to treatment, examination of patient, obtaining history from patient or surrogate, ordering and review of radiographic studies, ordering and review of laboratory studies, re-evaluation of patient's condition and review of old charts.   (including critical care time)  10:17 AM  Date: 04/14/2011  Rate: 93  Rhythm: normal sinus rhythm  QRS Axis: normal  Intervals: normal QRS:  Left ventricular hypertrophy  ST/T Wave abnormalities: ST elevations inferiorly  Conduction Disutrbances:none  Narrative Interpretation: Acute inferior myocardial infarction.  Old EKG Reviewed: none available  10:23 AM Pt seen STAT upon being shown EKG.  Physical exam performed.  CODE STEMI paged out.  Lab workup ordered.  IV heparin, chewable ASA ordered.    10:29 AM Code STemi team and Dr. Antoine Poche here.  Advised admission to 2900.    10:45 AM Pt resting comfortably, not in pain, waiting for admission to 2900.     1. Acute inferior myocardial infarction     2:13 PM Had brief episode of SVT that resolved spontaneously.  Dr. Antoine Poche notified.  Date: 04/14/2011  Rate: 153  Rhythm: sinus tachycardia  QRS Axis: normal  Intervals: normal  ST/T Wave abnormalities: Inverted T waves in anterior and lateral leads.  Conduction Disutrbances:none  Narrative Interpretation: Abnormal EKG.    Old EKG Reviewed: changes noted--heart rate transiently increased compared to prior EKG at 10:17 A.M.         Carleene Cooper III, MD 04/14/11 1049  Carleene Cooper III, MD 04/14/11 [phone]

## 2011-04-14 NOTE — Progress Notes (Signed)
ANTICOAGULATION CONSULT NOTE - Initial Consult  Pharmacy Consult for Heparin Indication: r/o MI  No Known Allergies  Patient Measurements: Ht: 66" Wt:47.2Kg IBW: 59Kg Heparin Dosing Weight: 47Kg  Vital Signs: Temp: 98.1 F (36.7 C) (02/01 0957) BP: 113/56 mmHg (02/01 1130) Pulse Rate: 90  (02/01 1130)  Labs:  Basename 04/14/11 1035 04/14/11 1028  HGB 11.0* --  HCT 34.5* --  PLT 467* --  APTT 62* --  LABPROT 14.9 --  INR 1.15 --  HEPARINUNFRC -- --  CREATININE 1.41* --  CKTOTAL -- 70  CKMB -- 1.8  TROPONINI -- <0.30   The CrCl is unknown because both a height and weight (above a minimum accepted value) are required for this calculation.  Medical History: Past Medical History  Diagnosis Date  . Paroxysmal atrial flutter [facility] admission 2/5-04/20/2010    High fall risk-not a Coumadin candidate  . Hypertension   . Chronic diastolic heart failure     Echo February 2012: EF 55%; moderate LVH; mild AI; mild MR; PASP 39  . Sick sinus syndrome     Limits use of beta blocker-pindolol tolerated  . NSTEMI (non-ST elevated myocardial infarction)     Troponin 0.12, 0.13, 0.08 during admission 04/2010    Medications:  PTA: BASA, Imdur, Lysine, Melatonin, Fish oil,  Visken, Demadex  Assessment: Patient with known history of CAD admitted for dyspnea and chest pressure. Received orders to start IV heparin per ACS protocol. Noted plans to cycle enzymes- no plans for ugrent cath. Noted 4000 unit IV heparin bolus administered in ED. No anticoagulation PTA, baseline labs noted.  Goal of Therapy:  Heparin level 0.3-0.7 units/ml   Plan:  1. Heparin infusion at 600 units/hr  2. Check heparin level at 8pm today, then daily heparin level and CBC. 3. Will f/up along with you.  Malyiah Fellows K. Allena Katz, PharmD, BCPS.  Clinical Pharmacist Pager [phone]. 04/14/2011 12:05 PM

## 2011-04-14 NOTE — Telephone Encounter (Signed)
Pt having palpitations, Sob, since 500am this am,  135/74 BP this am @ 645am, pls advise   [phone] [place] dtr

## 2011-04-14 NOTE — ED Notes (Signed)
MD changed pt to step down bed.  New bed request submitted

## 2011-04-14 NOTE — ED Notes (Signed)
Lab in to draw labs.

## 2011-04-14 NOTE — Progress Notes (Signed)
ANTICOAGULATION CONSULT NOTE - Initial Consult  Pharmacy Consult for Heparin Indication: r/o MI  No Known Allergies  Patient Measurements: Ht: 66" Wt:47.2Kg IBW: 59Kg Heparin Dosing Weight: 47Kg  Vital Signs: Temp: 98.1 F (36.7 C) (02/01 2121) Temp src: Oral (02/01 2121) BP: 145/93 mmHg (02/01 2121) Pulse Rate: 88  (02/01 2121)  Labs:  Basename 04/14/11 2038 04/14/11 1651 04/14/11 1035 04/14/11 1028  HGB -- -- 11.0* --  HCT -- -- 34.5* --  PLT -- -- 467* --  APTT -- -- 62* --  LABPROT -- -- 14.9 --  INR -- -- 1.15 --  HEPARINUNFRC <0.10* -- -- --  CREATININE -- -- 1.41* --  CKTOTAL -- 37 -- 70  CKMB -- 1.8 -- 1.8  TROPONINI -- <0.30 -- <0.30   The CrCl is unknown because both a height and weight (above a minimum accepted value) are required for this calculation.  Medical History: Past Medical History  Diagnosis Date  . Paroxysmal atrial flutter [facility] admission 2/5-04/20/2010    High fall risk-not a Coumadin candidate  . Hypertension   . Chronic diastolic heart failure     Echo February 2012: EF 55%; moderate LVH; mild AI; mild MR; PASP 39  . Sick sinus syndrome     Limits use of beta blocker-pindolol tolerated  . NSTEMI (non-ST elevated myocardial infarction)     Troponin 0.12, 0.13, 0.08 during admission 04/2010  . Angina   . Shortness of breath   . Arthritis   . Anxiety     Medications:  PTA: BASA, Imdur, Lysine, Melatonin, Fish oil,  Visken, Demadex  Assessment: Patient with known history of CAD admitted for dyspnea and chest pressure. Received orders to start IV heparin per ACS protocol. Noted plans to cycle enzymes- no plans for ugrent cath. Heparin level <0.1  Goal of Therapy:  Heparin level 0.3-0.7 units/ml   Plan:  1. Heparin bolus 3000 units IV x 1, then increase Heparin infusion at 800 units/hr  2. Heparin level and CBC in am with am labs 3. Will f/up along with you.  Celedonio Miyamoto, PharmD 04/14/2011 9:22 PM

## 2011-04-14 NOTE — ED Notes (Signed)
Family at bedside. 

## 2011-04-14 NOTE — Telephone Encounter (Signed)
Per Daughter - pt has been complaining of being very tired and cold lately.  BP 135/74 HR 110 and pt states she is feeling "palpitations" since 5 am today.  Daughter reports that she has been so weak that she almost passes out.  Instructed Hong to have pt evaluated in the ED.  Of note - on 03/24/11 when seen in ED pt's K was 3.3.  I do not see where that was replaced.  I will notify Rosann Auerbach that pt is coming to [facility] ED.

## 2011-04-14 NOTE — ED Notes (Signed)
Has been going on x2 days

## 2011-04-14 NOTE — Progress Notes (Signed)
Paged for a code STEMI. Chaplain spent time with patient's daughter who is a Runner, broadcasting/film/video. Chaplain listened and offered emotional support. Follow up as needed.

## 2011-04-14 NOTE — ED Notes (Signed)
Pt heart rate went up into the 150s, sinus tach. EKG repeated and Dr. Luberta Robertson.

## 2011-04-14 NOTE — ED Notes (Signed)
Sob and paplipations almost passed out this am  Took her asa today

## 2011-04-15 ENCOUNTER — Other Ambulatory Visit: Payer: Self-pay

## 2011-04-15 DIAGNOSIS — I495 Sick sinus syndrome: Secondary | ICD-10-CM

## 2011-04-15 LAB — URINALYSIS, ROUTINE W REFLEX MICROSCOPIC
Glucose, UA: NEGATIVE mg/dL
Ketones, ur: NEGATIVE mg/dL
pH: 6.5 (ref 5.0–8.0)

## 2011-04-15 LAB — CARDIAC PANEL(CRET KIN+CKTOT+MB+TROPI): Total CK: 37 U/L (ref 7–177)

## 2011-04-15 LAB — CBC
MCH: 28.8 pg (ref 26.0–34.0)
MCHC: 31.7 g/dL (ref 30.0–36.0)
Platelets: 468 10*3/uL — ABNORMAL HIGH (ref 150–400)

## 2011-04-15 LAB — BASIC METABOLIC PANEL
BUN: 32 mg/dL — ABNORMAL HIGH (ref 6–23)
Calcium: 9.1 mg/dL (ref 8.4–10.5)
GFR calc non Af Amer: 36 mL/min — ABNORMAL LOW (ref >90)
Glucose, Bld: 129 mg/dL — ABNORMAL HIGH (ref 70–99)

## 2011-04-15 LAB — LIPID PANEL
Cholesterol: 149 mg/dL (ref 0–200)
HDL: 51 mg/dL (ref >39)
Total CHOL/HDL Ratio: 2.9 RATIO

## 2011-04-15 LAB — HEPARIN LEVEL (UNFRACTIONATED): Heparin Unfractionated: 0.13 IU/mL — ABNORMAL LOW (ref 0.30–0.70)

## 2011-04-15 MED ORDER — CIPROFLOXACIN HCL 250 MG PO TABS
250.0000 mg | ORAL_TABLET | Freq: Two times a day (BID) | ORAL | Status: DC
  Administered 2011-04-15 – 2011-04-18 (×7): 250 mg via ORAL
  Filled 2011-04-15 (×9): qty 1

## 2011-04-15 NOTE — Progress Notes (Signed)
DShea Evans P.A. Paged and call returned. And made aware of U.A. Results see orders.

## 2011-04-15 NOTE — Progress Notes (Signed)
ANTICOAGULATION CONSULT NOTE - Follow Up Consult  Pharmacy Consult for Heparin Indication: CP/USA  No Known Allergies  Patient Measurements: Height: 4\' 9"  (144.8 cm) Weight: 100 lb 14.4 oz (45.768 kg) IBW/kg (Calculated) : 38.6  Heparin Dosing Weight: 47kg  Vital Signs: Temp: 97.1 F (36.2 C) (02/02 2003) Temp src: Oral (02/02 2003) BP: 156/86 mmHg (02/02 2003) Pulse Rate: 110  (02/02 2003)  Labs:  Basename 04/15/11 2018 04/15/11 0955 04/15/11 0545 04/14/11 2211 04/14/11 2038 04/14/11 1651 04/14/11 1035  HGB -- 11.3* -- -- -- -- 11.0*  HCT -- 35.7* -- -- -- -- 34.5*  PLT -- 468* -- -- -- -- 467*  APTT -- -- -- -- -- -- 62*  LABPROT -- -- -- -- -- -- 14.9  INR -- -- -- -- -- -- 1.15  HEPARINUNFRC 0.31 0.13* -- -- <0.10* -- --  CREATININE -- 1.26* -- -- -- -- 1.41*  CKTOTAL -- -- 37 40 -- 37 --  CKMB -- -- 1.7 1.8 -- 1.8 --  TROPONINI -- -- <0.30 <0.30 -- <0.30 --   Estimated Creatinine Clearance: 17 ml/min (by C-G formula based on Cr of 1.26).   Medications:  Heparin 1000 units/hr  Assessment: 89yof on heparin for CP/USA. Heparin level (0.31) is therapeutic.  - H/H and Plts wnl - No significant bleeding reported  Goal of Therapy:  Heparin level 0.3-0.7 units/ml   Plan:  1. Continue heparin 1000 units/hr 2. Follow-up AM heparin level  Cleon Dew [phone] 04/15/2011,10:06 PM

## 2011-04-15 NOTE — Progress Notes (Signed)
RN called at 2/2 0030 and noted that heparin order placed for wrong rate. Rate supposed to be increased to 800 units/hr per pharmacy note earlier this evening, order placed for 500 units/hr.   Plan:  1) I changed order to 800 units/hr 2) Will retime a.m. Labs for 0730 in order to get 8 hr heparin level.  Christoper Fabian, PharmD, BCPS Clinical Pharmacist Pager [phone]

## 2011-04-15 NOTE — Progress Notes (Signed)
   Subjective: Through interpretation assistance from family, patient denies any chest pain, admits to lower abdominal discomfort. Also feels weak and tired. Appetite diminished.   Objective: Temp:  [97.4 F (36.3 C)-99.2 F (37.3 C)] 99.2 F (37.3 C) (02/02 0456) Pulse Rate:  [78-98] 82  (02/02 1019) Resp:  [19-24] 20  (02/02 0456) BP: (116-145)/(59-93) 142/60 mmHg (02/02 1019) SpO2:  [98 %-100 %] 98 % (02/02 0456) Weight:  [100 lb 14.4 oz (45.768 kg)-105 lb 6.4 oz (47.809 kg)] 100 lb 14.4 oz (45.768 kg) (02/02 0107)  I/O last 3 completed shifts: In: 120 [P.O.:120] Out: -   Telemetry - Sinus rhythm, no recurrent atrial flutter.  Exam -   General - Short statured elderly woman, no acute distress.  Lungs - Clear, nonlabored.  Cardiac - Regular rate and rhythm, no S3  Abdomen - NABS, no guarding.  Extremities - No pitting.  Testing -   Lab Results  Component Value Date   WBC 5.8 04/15/2011   HGB 11.3* 04/15/2011   HCT 35.7* 04/15/2011   MCV 91.1 04/15/2011   PLT 468* 04/15/2011    Lab Results  Component Value Date   CREATININE 1.26* 04/15/2011   BUN 32* 04/15/2011   NA 140 04/15/2011   K 4.0 04/15/2011   CL 104 04/15/2011   CO2 24 04/15/2011    Lab Results  Component Value Date   CKTOTAL 37 04/15/2011   CKMB 1.7 04/15/2011   TROPONINI <0.30 04/15/2011    ECG - Followup ECG reviewed showing sinus rhythm with persistent inferolateral ST changes similar to prior tracing in the absence of any active chest pain.   Current Medications    . aspirin  324 mg Oral NOW  . aspirin  81 mg Oral Daily  . ciprofloxacin  250 mg Oral BID  . furosemide  40 mg Intravenous BID  . heparin  4,000 Units Intravenous Once  . omega-3 acid ethyl esters  1 g Oral Daily  . pindolol  2.5 mg Oral BID  . sodium chloride  3 mL Intravenous Q12H    Assessment:  1. Presentation with chest pain and shortness of breath, possibly unstable angina symptoms with ECG changes concerning for inferior injury  current, however cardiac markers have been normal. Also has component of acute on chronic diastolic heart failure. Dr. Antoine Poche elected to manage the patient conservatively.  2. Possible UTI, on antibiotics.  3. Atrial flutter with RVR at presentation, now sinus rhythm. May also have precipitated some of her symptoms. Deemed not good Coumadin candidate.  3. History of sick sinus syndrome, limiting therapies.  Plan:  Continue plan for conservative management and medical therapy. Antibiotics for UTI. She continues on aspirin, Lasix for diuresis, pindolol for rhythm management, and heparin. Discussed with patient and family present.   Jonelle Sidle, M.D., F.A.C.C.

## 2011-04-15 NOTE — Progress Notes (Signed)
ANTICOAGULATION CONSULT NOTE - Follow Up Consult  Pharmacy Consult for   Heparin Indication: chest pain/ACS  ;No Known Allergies  Patient Measurements: Height: 4\' 9"  (144.8 cm) Weight: 100 lb 14.4 oz (45.768 kg) IBW/kg (Calculated) : 38.6  Heparin Dosing Weight: 45.8 kg  Vital Signs: Temp: 99.2 F (37.3 C) (02/02 0456) Temp src: Oral (02/02 0456) BP: 142/60 mmHg (02/02 1019) Pulse Rate: 82  (02/02 1019)  Labs:  Basename 04/15/11 0955 04/15/11 0545 04/14/11 2211 04/14/11 2038 04/14/11 1651 04/14/11 1035  HGB 11.3* -- -- -- -- 11.0*  HCT 35.7* -- -- -- -- 34.5*  PLT 468* -- -- -- -- 467*  APTT -- -- -- -- -- 62*  LABPROT -- -- -- -- -- 14.9  INR -- -- -- -- -- 1.15  HEPARINUNFRC 0.13* -- -- <0.10* -- --  CREATININE 1.26* -- -- -- -- 1.41*  CKTOTAL -- 37 40 -- 37 --  CKMB -- 1.7 1.8 -- 1.8 --  TROPONINI -- <0.30 <0.30 -- <0.30 --   Estimated Creatinine Clearance: 17 ml/min (by C-G formula based on Cr of 1.26).  Assessment:   Heparin level remains subtherapeutic. CBC stable.   Currently running at 800 units/hr.   Enzymes negative, not planning cath.    Goal of Therapy:  Heparin level 0.3-0.7 units/ml   Plan:     Will increase Heparin to 1000 units/hr.    Next level in 8 hrs.    Continue daily CBC.    Will f/u plans.  Dennie Fetters Pager: [phone] 04/15/2011,11:24 AM

## 2011-04-16 ENCOUNTER — Other Ambulatory Visit: Payer: Self-pay

## 2011-04-16 ENCOUNTER — Inpatient Hospital Stay (HOSPITAL_COMMUNITY): Payer: Medicaid Other

## 2011-04-16 LAB — CBC
HCT: 38.7 % (ref 36.0–46.0)
MCHC: 31.8 g/dL (ref 30.0–36.0)
RDW: 12.5 % (ref 11.5–15.5)

## 2011-04-16 LAB — URINE CULTURE: Culture  Setup Time: 201302021948

## 2011-04-16 LAB — HEPARIN LEVEL (UNFRACTIONATED): Heparin Unfractionated: 0.25 IU/mL — ABNORMAL LOW (ref 0.30–0.70)

## 2011-04-16 MED ORDER — IOHEXOL 300 MG/ML  SOLN
20.0000 mL | INTRAMUSCULAR | Status: AC
  Administered 2011-04-16 (×2): 20 mL via ORAL

## 2011-04-16 MED ORDER — HYDROCODONE-ACETAMINOPHEN 5-325 MG PO TABS
1.0000 | ORAL_TABLET | ORAL | Status: DC | PRN
  Administered 2011-04-16 – 2011-04-17 (×6): 1 via ORAL
  Administered 2011-04-18 – 2011-04-20 (×3): 2 via ORAL
  Filled 2011-04-16 (×3): qty 1
  Filled 2011-04-16 (×2): qty 2
  Filled 2011-04-16: qty 1
  Filled 2011-04-16: qty 2
  Filled 2011-04-16 (×2): qty 1

## 2011-04-16 NOTE — Progress Notes (Signed)
Subjective: Through interpreter (patient's daughter) she complains of generally feeling weak, lower abdominal discomfort, increased urination. No frank chest pain or palpitations. Poor appetite.   Objective: Temp:  [97.1 F (36.2 C)-98.9 F (37.2 C)] 97.5 F (36.4 C) (02/03 0452) Pulse Rate:  [80-110] 103  (02/03 0949) Resp:  [18-19] 19  (02/03 0452) BP: (130-156)/(60-86) 145/74 mmHg (02/03 0949) SpO2:  [97 %-100 %] 97 % (02/03 0452) Weight:  [100 lb 4.8 oz (45.496 kg)] 100 lb 4.8 oz (45.496 kg) (02/03 0031)  I/O last 3 completed shifts: In: 600 [P.O.:600] Out: 1200 [Urine:1200]  Telemetry - Sinus rhythm with burst of regular SVT, possibly flutter.  Exam -   General - Short statured elderly woman, no acute distress.  Lungs - Clear, nonlabored.  Cardiac - Regular rate and rhythm, no S3  Abdomen - Decreased BS, no guarding or rebound.  Extremities - No pitting.  Testing -   Lab Results  Component Value Date   WBC 5.6 04/16/2011   HGB 12.3 04/16/2011   HCT 38.7 04/16/2011   MCV 90.4 04/16/2011   PLT 424* 04/16/2011    Lab Results  Component Value Date   CREATININE 1.26* 04/15/2011   BUN 32* 04/15/2011   NA 140 04/15/2011   K 4.0 04/15/2011   CL 104 04/15/2011   CO2 24 04/15/2011    Lab Results  Component Value Date   CKTOTAL 37 04/15/2011   CKMB 1.7 04/15/2011   TROPONINI <0.30 04/15/2011    ECG - Sinus rhythm with increased voltage and similar inferolateral ST segment changes as prior tracing, specifically inferior elevation and lateral depression with T wave inversions.   Current Medications    . aspirin  324 mg Oral NOW  . aspirin  81 mg Oral Daily  . ciprofloxacin  250 mg Oral BID  . furosemide  40 mg Intravenous BID  . omega-3 acid ethyl esters  1 g Oral Daily  . pindolol  2.5 mg Oral BID  . sodium chloride  3 mL Intravenous Q12H    Assessment:  1. Presentation with chest pain and shortness of breath, possibly unstable angina symptoms with ECG changes concerning  for inferior injury current, however cardiac markers have been normal. Also has component of acute on chronic diastolic heart failure. Dr. Antoine Poche elected to manage the patient conservatively. No significant change in ECG without recurrent chest pain.  2. Possible UTI, on antibiotics. Urine culture pending.  3. Atrial flutter with RVR at presentation, now sinus rhythm, with brief bursts of SVT. Deemed not good Coumadin candidate.  3. History of sick sinus syndrome, limiting therapies.  4. Lower abdominal discomfort, poor appetite. Not certain if UTI explains this entirely.  Plan:  Spoke with patient and her daughter. Would like to check a CT of the abdomen and pelvis to make sure there is no other acute process to consider. Otherwise continue antibiotics for UTI, awaiting cultures for sensitivity. Continue conservative management of cardiac status. Can likely discontinue heparin in the morning if no further chest pain.   Jonelle Sidle, M.D., F.A.C.C.

## 2011-04-16 NOTE — Progress Notes (Signed)
Called by RN regarding CT Abd - hydronephronsis seen. Pt with significant abd/flank pain. Ordered Vicodin PRN and will get MD to review CT and advise further. She is on Cipro for a UTI with urine Cx pending.  Spoke with DB. Her creatinine is not extremely high and the abnormality on the CT was felt to be chronic. The best option now is to continue to monitor her and reassess in am with a repeat BMET.

## 2011-04-16 NOTE — Progress Notes (Signed)
ANTICOAGULATION CONSULT NOTE - Follow Up Consult  Pharmacy Consult for   Heparin Indication: chest pain/ACS  ;No Known Allergies  Patient Measurements: Height: 4\' 9"  (144.8 cm) Weight: 100 lb 4.8 oz (45.496 kg) IBW/kg (Calculated) : 38.6  Heparin Dosing Weight: 45.5 kg  Vital Signs: Temp: 97.5 F (36.4 C) (02/03 0452) Temp src: Oral (02/03 0452) BP: 145/74 mmHg (02/03 0949) Pulse Rate: 103  (02/03 0949)  Labs:  Basename 04/16/11 0610 04/15/11 2018 04/15/11 0955 04/15/11 0545 04/14/11 2211 04/14/11 1651 04/14/11 1035  HGB 12.3 -- 11.3* -- -- -- --  HCT 38.7 -- 35.7* -- -- -- 34.5*  PLT 424* -- 468* -- -- -- 467*  APTT -- -- -- -- -- -- 62*  LABPROT -- -- -- -- -- -- 14.9  INR -- -- -- -- -- -- 1.15  HEPARINUNFRC 0.25* 0.31 0.13* -- -- -- --  CREATININE -- -- 1.26* -- -- -- 1.41*  CKTOTAL -- -- -- 37 40 37 --  CKMB -- -- -- 1.7 1.8 1.8 --  TROPONINI -- -- -- <0.30 <0.30 <0.30 --   Estimated Creatinine Clearance: 17 ml/min (by C-G formula based on Cr of 1.26).  Assessment:   Heparin level now slightly subtherapeutic. CBC stable.   Currently running at 1000 units/hr.   Conservative management.   Noted not a good Coumadin candidate.   Heparin may be stopped in am if no further chest pain.  Goal of Therapy:  Heparin level 0.3-0.7 units/ml   Plan:     Will increase Heparin to 1100 units/hr.    Continue daily heparin level and CBC.    Dennie Fetters Pager: [phone] 04/16/2011,11:06 AM

## 2011-04-16 NOTE — Progress Notes (Signed)
04/16/2011 3:31 PM Nursing Note CT ABD results called to [facility]. Orders received. PRN medication given per orders. Will continue to monitor patient.  Kita Neace, Blanchard Kelch

## 2011-04-17 ENCOUNTER — Other Ambulatory Visit: Payer: Self-pay

## 2011-04-17 DIAGNOSIS — I5031 Acute diastolic (congestive) heart failure: Secondary | ICD-10-CM

## 2011-04-17 LAB — BASIC METABOLIC PANEL
BUN: 28 mg/dL — ABNORMAL HIGH (ref 6–23)
Creatinine, Ser: 1.67 mg/dL — ABNORMAL HIGH (ref 0.50–1.10)
GFR calc Af Amer: 29 mL/min — ABNORMAL LOW (ref >90)
GFR calc non Af Amer: 25 mL/min — ABNORMAL LOW (ref >90)

## 2011-04-17 LAB — CBC
MCH: 28.6 pg (ref 26.0–34.0)
MCV: 90.2 fL (ref 78.0–100.0)
Platelets: 446 10*3/uL — ABNORMAL HIGH (ref 150–400)
RDW: 12.6 % (ref 11.5–15.5)
WBC: 6.2 10*3/uL (ref 4.0–10.5)

## 2011-04-17 NOTE — Progress Notes (Addendum)
SUBJECTIVE:  She is fatigued but she denies pain.  No SOB   PHYSICAL EXAM Filed Vitals:   04/16/11 0949 04/16/11 1442 04/16/11 2107 04/17/11 0419  BP: 145/74 118/72 141/61 121/74  Pulse: 103 78 76 95  Temp:  98.1 F (36.7 C) 98 F (36.7 C) 97 F (36.1 C)  TempSrc:  Oral Oral Oral  Resp:  18 18 18   Height:      Weight:    46.085 kg (101 lb 9.6 oz)  SpO2:  97% 97% 100%   General:  No distress Lungs:  Clear Heart:  RRR Abdomen:  Positive bowel sounds, no rebound no guarding Extremities:  No edema  LABS: Results for orders placed during the hospital encounter of 04/14/11 (from the past 24 hour(s))  CBC     Status: Abnormal   Collection Time   04/17/11  6:25 AM      Component Value Range   WBC 6.2  4.0 - 10.5 (K/uL)   RBC 4.27  3.87 - 5.11 (MIL/uL)   Hemoglobin 12.2  12.0 - 15.0 (g/dL)   HCT 16.1  09.6 - 04.5 (%)   MCV 90.2  78.0 - 100.0 (fL)   MCH 28.6  26.0 - 34.0 (pg)   MCHC 31.7  30.0 - 36.0 (g/dL)   RDW 40.9  81.1 - 91.4 (%)   Platelets 446 (*) 150 - 400 (K/uL)  HEPARIN LEVEL (UNFRACTIONATED)     Status: Normal   Collection Time   04/17/11  6:25 AM      Component Value Range   Heparin Unfractionated 0.53  0.30 - 0.70 (IU/mL)  BASIC METABOLIC PANEL     Status: Abnormal   Collection Time   04/17/11  6:25 AM      Component Value Range   Sodium 128 (*) 135 - 145 (mEq/L)   Potassium 4.1  3.5 - 5.1 (mEq/L)   Chloride 95 (*) 96 - 112 (mEq/L)   CO2 21  19 - 32 (mEq/L)   Glucose, Bld 111 (*) 70 - 99 (mg/dL)   BUN 28 (*) 6 - 23 (mg/dL)   Creatinine, Ser 7.82 (*) 0.50 - 1.10 (mg/dL)   Calcium 9.1  8.4 - 95.6 (mg/dL)   GFR calc non Af Amer 25 (*) >90 (mL/min)   GFR calc Af Amer 29 (*) >90 (mL/min)    Intake/Output Summary (Last 24 hours) at 04/17/11 0840 Last data filed at 04/17/11 0700  Gross per 24 hour  Intake    480 ml  Output   1000 ml  Net   -520 ml    CT Abdomen and Pelvis:  Massive left renal hydronephrosis and renal cortical thinning,  without  radiopaque renal or ureteral calculus. The ureter is  normal in caliber. Findings are most suggestive of chronic  ureteropelvic junction obstruction. Alternative etiologies could  include stricture, nonradiopaque calculus, or neoplasm such as  transitional cell carcinoma. Further outpatient neurological  workup could include renal mass protocol MRI with contrast for  improved visualization of a potential obstructing enhancing mass  lesion.   ASSESSMENT AND PLAN:  1) Dyspnea:   Multifactorial  2) UTI: Continue Cipro  3) Flutter: The patient is a currently in sinus rhythm. No change in therapy is indicated.  She does have paroxysms but is not a good candidate for other therapies.  4) Diastolic Dysfunction:  She seems to be euvolemic.  At this point, no change in therapy is indicated. No further cardiovascular testing is indicated.  As below  I will stop the Lasix.  5) Hyponatremia:  Na falling.  Free water restrict.  Stop Lasix.    6) Abnormal CT:  I will ask urology to see her.  Fayrene Fearing [facility] 04/17/2011 8:40 AM

## 2011-04-17 NOTE — Progress Notes (Signed)
UR Completed. [name], Yvette Samaras F [phone]  

## 2011-04-17 NOTE — Progress Notes (Signed)
PT HEART RATE INCREASED TO 140'S AFIB(NO IDENTIFIABLE P WAVE). EKG OBTAINED, READ SVT 140'S. PT ASYMPTOMATIC VS STABLE. CALLED MD  MADE HIM AWARE OF ABOVE. NO NEW ORDERS GIVEN. WILL CONTINUE TO MONITOR.

## 2011-04-17 NOTE — Progress Notes (Signed)
PT HEARTRATE NSR IN 90'S. WILL CONTINUE TO MONITOR.

## 2011-04-17 NOTE — Consult Note (Signed)
Consult for Left hydronephrosis by Dr. Maxwell Marion Dunn PA-C HPI:  History given by her daughter. Pt admitted for shortness of breath and palpitations.Her cardiac enzymes remain normal and she was given diuretics. Her shortness of breath has improved. She has been on Cipro for a "UTI". Urine culture was negative growing multiple species.  She developed left Lower quadrant abdominal pain March 21, 2011. At that time a KUB revealed a normal bowel gas pattern and no stones. Pain is colicky and intermittent. Nothing seems to make it worse, but Vicodin improves pain. She has nausea and poor appetite.  CT Apr 16, 2011 revealed chronic left severe hydronephrosis c/w chronic UPJ obstruction. The right kidney appeared normal. I reviewed CT and KUB images.  When patient moved to the [place] from [place] about 10 yrs ago pt son told pt's daughter regarding their mother "only one kidney is working and the other is totally blocked".   Pt has chronic frequency and urgency. No dysuria. These symptoms are not changed by antibiotics. No fever.  She denies prior GU surgery or pelvic surgery.   Objective: Vital signs in last 24 hours: Temp:  [97 F (36.1 C)-98 F (36.7 C)] 97.6 F (36.4 C) (02/04 1409) Pulse Rate:  [75-95] 75  (02/04 1409) Resp:  [18] 18  (02/04 1409) BP: (121-141)/(61-74) 124/64 mmHg (02/04 1409) SpO2:  [97 %-100 %] 97 % (02/04 1409) Weight:  [46.085 kg (101 lb 9.6 oz)] 46.085 kg (101 lb 9.6 oz) (02/04 0419)  Intake/Output from previous day: 02/03 0701 - 02/04 0700 In: 720 [P.O.:720] Out: 1000 [Urine:1000] Intake/Output this shift:   ROS, FH, PMH, ALL, MEDS, SH reviewed in chart.  Physical Exam:  General: NAD Neuro: alert GI: abd soft, NT, no mass, no flank pain Ext - no CCE  Lab Results:  Basename 04/17/11 0625 04/16/11 0610 04/15/11 0955  HGB 12.2 12.3 11.3*  HCT 38.5 38.7 35.7*   BMET  Basename 04/17/11 0625 04/15/11 0955  NA 128* 140  K 4.1 4.0  CL 95* 104    CO2 21 24  GLUCOSE 111* 129*  BUN 28* 32*  CREATININE 1.67* 1.26*  CALCIUM 9.1 9.1   No results found for this basename: LABPT:3,INR:3 in the last 72 hours No results found for this basename: LABURIN:1 in the last 72 hours Results for orders placed during the hospital encounter of 04/14/11  URINE CULTURE     Status: Normal   Collection Time   04/15/11  2:20 PM      Component Value Range Status Comment   Specimen Description URINE, CLEAN CATCH   Final    Special Requests NONE   Final    Culture  Setup Time [id number]   Final    Colony Count 65,000 COLONIES/ML   Final    Culture     Final    Value: Multiple bacterial morphotypes present, none predominant. Suggest appropriate recollection if clinically indicated.   Report Status 04/16/2011 FINAL   Final     Studies/Results: Ct Abdomen Pelvis Wo Contrast  04/16/2011  *RADIOLOGY REPORT*  Clinical Data:  lower abdominal pain, anorexia  CT ABDOMEN AND PELVIS WITHOUT CONTRAST  Technique:  Multidetector CT imaging of the abdomen and pelvis was performed following the standard protocol without intravenous contrast.  Comparison: None except for chest radiograph 04/14/2011  Findings: Marked enlargement of the heart is noted.  No visualized pericardial or pleural effusion.  Minimal dependent bibasilar atelectasis is noted.  Extensive splenic vascular calcification noted. Examination is degraded  by patient motion.  At the dome of the right hepatic lobe, there is a 1.4 cm cyst identified.  Gallbladder, right kidney, adrenal glands, spleen, and pancreas are normal. There is marked enlargement of the left kidney with massive hydronephrosis and apparent cortical thinning, although detail is suboptimal due to lack of contrast.  No surrounding fluid collection or perinephric stranding is identified.  The ureters are normal in caliber bilaterally.  No radiopaque renal or ureteral calculus.  Atherosclerotic vascular calcification noted without aneurysm.  Uterus  and ovaries are normal.  The bladder is decompressed but unremarkable. Scattered colonic diverticuli noted without evidence for diverticulitis.  No bowel wall thickening or focal segmental dilatation.  The appendix is not visualized but there is no secondary evidence for acute appendicitis.  No acute osseous finding. Compression fracture/vertebral plana at T11 again noted.  IMPRESSION: Massive left renal hydronephrosis and renal cortical thinning, without radiopaque renal or ureteral calculus.  The ureter is normal in caliber.  Findings are most suggestive of chronic ureteropelvic junction obstruction.  Alternative etiologies could include stricture, nonradiopaque calculus, or neoplasm such as transitional cell carcinoma.  Further outpatient neurological workup could include renal mass protocol MRI with contrast for improved visualization of a potential obstructing enhancing mass lesion.  Original Report Authenticated By: Harrel Lemon, M.D.   KUB - Mar 24 2011 as above  Assessment/Plan: #1 pyuria/bacteriuria - asymptomatic-this is not uncommon in patients her age. If she remains afebrile and has no dysuria, I would not treat her for UTI. Her UA has bacteria and pyuria for over 1 year.  #2 left hydronephrosis -  this appears to be chronic UPJ obstruction as the left kidney parenchyma is thin. I discussed the findings with the patient and her family and indeed they confirm this is something they've known about and is chronic. We discussed the nature risks and benefits of continued surveillance with conservative management given she has a normal right kidney and relieving the left kidney obstruction would do little for her renal function. We also discussed the nature risks and benefits of cystoscopy with left retrograde pyelogram and left ureteral stent placement or left percutaneous nephrostomy tube. All questions were answered. The daughter said they want nothing done.   3# ARF - would continue to  monitor and manage medically.   LOS: 3 days   Antony Haste 04/17/2011, 8:20 PM

## 2011-04-18 DIAGNOSIS — R109 Unspecified abdominal pain: Secondary | ICD-10-CM

## 2011-04-18 DIAGNOSIS — R079 Chest pain, unspecified: Secondary | ICD-10-CM

## 2011-04-18 DIAGNOSIS — I4892 Unspecified atrial flutter: Principal | ICD-10-CM

## 2011-04-18 LAB — CBC
MCH: 28.7 pg (ref 26.0–34.0)
Platelets: 419 10*3/uL — ABNORMAL HIGH (ref 150–400)
RBC: 4.07 MIL/uL (ref 3.87–5.11)

## 2011-04-18 LAB — BASIC METABOLIC PANEL
CO2: 23 mEq/L (ref 19–32)
Calcium: 9.4 mg/dL (ref 8.4–10.5)
GFR calc non Af Amer: 26 mL/min — ABNORMAL LOW (ref >90)
Sodium: 130 mEq/L — ABNORMAL LOW (ref 135–145)

## 2011-04-18 MED ORDER — POLYETHYLENE GLYCOL 3350 17 G PO PACK
17.0000 g | PACK | Freq: Every day | ORAL | Status: DC
  Administered 2011-04-18: 17 g via ORAL
  Filled 2011-04-18 (×3): qty 1

## 2011-04-18 MED ORDER — FAMOTIDINE 20 MG PO TABS
20.0000 mg | ORAL_TABLET | Freq: Two times a day (BID) | ORAL | Status: DC
  Administered 2011-04-18 – 2011-04-19 (×2): 20 mg via ORAL
  Filled 2011-04-18 (×4): qty 1

## 2011-04-18 NOTE — Progress Notes (Signed)
SUBJECTIVE:  She is complaining more of abdominal pain (lower) yesterday and today.  She is not getting out of bed because of this.    PHYSICAL EXAM Filed Vitals:   04/17/11 1409 04/17/11 2124 04/18/11 0508 04/18/11 0719  BP: 124/64 110/64 126/70 146/76  Pulse: 75 87 83 92  Temp: 97.6 F (36.4 C) 97 F (36.1 C) 97.6 F (36.4 C) 97.9 F (36.6 C)  TempSrc:  Oral Oral Oral  Resp: 18 18 20 18   Height:      Weight:   46.1 kg (101 lb 10.1 oz)   SpO2: 97% 96% 92% 97%   General:  No distress Lungs:  Clear Heart:  RRR Abdomen:  Positive bowel sounds, no rebound no guarding but tender to deep palpation lower right and left quadrants.  Extremities:  No edema  LABS: Results for orders placed during the hospital encounter of 04/14/11 (from the past 24 hour(s))  CBC     Status: Abnormal   Collection Time   04/18/11  5:00 AM      Component Value Range   WBC 5.5  4.0 - 10.5 (K/uL)   RBC 4.07  3.87 - 5.11 (MIL/uL)   Hemoglobin 11.7 (*) 12.0 - 15.0 (g/dL)   HCT 02.7  25.3 - 66.4 (%)   MCV 88.5  78.0 - 100.0 (fL)   MCH 28.7  26.0 - 34.0 (pg)   MCHC 32.5  30.0 - 36.0 (g/dL)   RDW 40.3  47.4 - 25.9 (%)   Platelets 419 (*) 150 - 400 (K/uL)  BASIC METABOLIC PANEL     Status: Abnormal   Collection Time   04/18/11  5:00 AM      Component Value Range   Sodium 130 (*) 135 - 145 (mEq/L)   Potassium 4.4  3.5 - 5.1 (mEq/L)   Chloride 97  96 - 112 (mEq/L)   CO2 23  19 - 32 (mEq/L)   Glucose, Bld 95  70 - 99 (mg/dL)   BUN 23  6 - 23 (mg/dL)   Creatinine, Ser 5.63 (*) 0.50 - 1.10 (mg/dL)   Calcium 9.4  8.4 - 87.5 (mg/dL)   GFR calc non Af Amer 26 (*) >90 (mL/min)   GFR calc Af Amer 31 (*) >90 (mL/min)    Intake/Output Summary (Last 24 hours) at 04/18/11 0846 Last data filed at 04/17/11 2300  Gross per 24 hour  Intake    480 ml  Output    625 ml  Net   -145 ml     ASSESSMENT AND PLAN:  1) Dyspnea:   Multifactorial.  This is not currently an acute issue.  No change in therapy  2)  UTI:  Bacturia.  Per urology I will stop the cipro.  This is chronic contamination.  3) Flutter: The patient is a currently in sinus rhythm. No change in therapy is indicated.  She does have paroxysms but is not a good candidate for other therapies.  4) Diastolic Dysfunction:  She seems to be euvolemic.  At this point, no change in therapy is indicated. No further cardiovascular testing is indicated.  I stopped the Lasix yesterday and she will remain off of diuretic today.  5) Hyponatremia:   Free water restrict.  Na is low but stable and creat is at baseline  6) Abnormal CT:  I appreciate the urology consult.  No further studies or therapies planned.  7) Abdominal pain:  This is the most active problem.  I will ask  for a GI consult.  Yvette [name] [facility] 04/18/2011 8:46 AM

## 2011-04-18 NOTE — Consult Note (Signed)
[facility] Consult: 12:05 PM 04/18/2011   Referring Provider: Antoine Poche  Primary Care Physician:  Majel Homer MD,  she will establish care with him at  [facility] clinic.  Visit set for 04/26/11. Primary Gastroenterologist:  Never seen by a GI MD.     Reason for Consultation:  Lower abdominal pain.  HPI: Yvette [name] is a 78 y.o. vietnamese female. Daughter is Nurse, learning disability as pt speaks no [place].  Hx atrial flutter, not on Coumadin because of hx of falls.  SSS.  Diastolic heart failure.  NSTE MI in 04/2010.  Pt came to ED yesterday with chest pressure and increased SOB over undetermined period of time, especially at night.  Pulse was irregular at 110, some subtle new EKG changes.  Cardiac markers are normal.   She is being managed medically. Pulse is now normal.  Treated recently for a UTI, completing 2 weeks of Cipro about one week ago.  She has had pain in the LLQ and diminished appetite.  For years this was intermittent every few months.  For 4 to 5 weeks has been constant, worse after meals, relieved with prn advil and tylenol.  No change in bowel habits, no diarrhea/constipation.  Last BM was yesterday.  Note this was presenting complaint for ED visit of 03/24/11.  It is better with Vicodin here in hospital.     CT scan abd/pelvis 03/16/11 shows massive hydronephrosis on left with appearance of chronic ureteropelvic obstruction, though can not rule out stones, neoplasm.  Extensive splenic vascular calcification noted.  Could get MRI to further investigate.  Urology,  Dr Mena Goes, has seen pt.  Since this is chronic obstruction, will manage conservatively.  Family prefers no to limited interventions.  I do not find any prior GI studies in e record.  Family says she's never had colonoscopy, egd.  No blood in stool,no melena.  No nausea just long hx of anorexia.  BMs daily, small volume.  No fevers or chills.   No fevers or elevated WBCs, LFTs are normal.     Past  Medical History  Diagnosis Date  . Paroxysmal atrial flutter [facility] admission 2/5-04/20/2010    High fall risk-not a Coumadin candidate  . Hypertension   . Chronic diastolic heart failure     Echo February 2012: EF 55%; moderate LVH; mild AI; mild MR; PASP 39  . Sick sinus syndrome     Limits use of beta blocker-pindolol tolerated  . NSTEMI (non-ST elevated myocardial infarction)     Troponin 0.12, 0.13, 0.08 during admission 04/2010  . Angina   . Shortness of breath   . Arthritis   . Anxiety     History reviewed. No pertinent past surgical history.  Prior to Admission medications   Medication Sig Start Date End Date Taking? Authorizing Provider  aspirin 81 MG chewable tablet Chew 81 mg by mouth daily.   Yes Historical Provider, MD  isosorbide mononitrate (IMDUR) 30 MG 24 hr tablet Take 30 mg by mouth daily.    Yes Historical Provider, MD  Lysine 500 MG CAPS Take 500 mg by mouth 2 (two) times daily.    Yes Historical Provider, MD  Melatonin 3 MG TABS Take 3 mg by mouth at bedtime.    Yes Historical Provider, MD  Omega-3 Fatty Acids (FISH OIL PO) Take 1 capsule by mouth daily.   Yes Historical Provider, MD  pindolol (VISKEN) 5 MG tablet Take 0.5 tablets (2.5 mg total) by mouth 2 (two) times daily. 04/13/11  Yes Fayrene Fearing  Hochrein, MD  torsemide (DEMADEX) 20 MG tablet Take 20 mg by mouth daily. 01/25/11 01/25/12 Yes Rollene Rotunda, MD    Scheduled Meds:    . aspirin  81 mg Oral Daily  . omega-3 acid ethyl esters  1 g Oral Daily  . pindolol  2.5 mg Oral BID  . sodium chloride  3 mL Intravenous Q12H  . DISCONTD: ciprofloxacin  250 mg Oral BID   Infusions:   PRN Meds: sodium chloride, acetaminophen, HYDROcodone-acetaminophen, nitroGLYCERIN, ondansetron (ZOFRAN) IV, sodium chloride   Allergies as of 04/14/2011  . (No Known Allergies)    No family history on file.  History   Social History  . Marital Status: Widowed    Spouse Name: N/A    Number of Children: N/A  . Years of  Education: N/A   Occupational History  . Not on file.   Social History Main Topics  . Smoking status: Never Smoker   . Smokeless tobacco: Not on file  . Alcohol Use: No  . Drug Use: No  . Sexually Active: No   Other Topics Concern  . Not on file   Social History Narrative  . No narrative on file    REVIEW OF SYSTEMS: Constitutional:  Spends a lot of time resting.   ENT:  No nose bleeds.  No sinus congestion Pulm:  No cough or dyspnea CV:  Chest pressure resolved GU:  + for frequency. - for dysuria GI:  No dysphagia. No heartburn Heme:  No hx anemia.    Transfusions:  none Neuro:  No headaches, no confusion Derm:  norash or itching Endocrine:  No diabetes Immunization:  12/2010 flu shot. Travel:  None.   PHYSICAL EXAM: Vital signs in last 24 hours: Temp:  [97 F (36.1 C)-97.9 F (36.6 C)] 97.9 F (36.6 C) (02/05 0719) Pulse Rate:  [75-92] 92  (02/05 0719) Resp:  [18-20] 18  (02/05 0719) BP: (110-146)/(64-76) 146/76 mmHg (02/05 0719) SpO2:  [92 %-97 %] 97 % (02/05 0719) Weight:  [101 lb 10.1 oz (46.1 kg)] 101 lb 10.1 oz (46.1 kg) (02/05 0508)  General: Frail, petite, elderly Asian woman.  Resting comfortably. Head:  No assymmetry or trauma signs Eyes:  No pallor or icterus Ears:  No hearing aids in place  Nose:  No discharge or blood at nares Mouth:  Extremely poor dentition, eroded discolored teeth Neck:  No JVD or mass Lungs:  Clear to A & P bil Heart: RRR, no MRG Abdomen:  Soft, active BS.  Tenderness, mild at left mid and lower abdomen.  No mass or HSM. Rectal: brown stool, no mass.  Trace FOB+   Musc/Skeltl: no joint swelling Extremities:  No pedal edema.  Feet warm.  Neurologic:  Follows commands given in her native language. Skin:  No sores or rash Tattoos:  None seen Nodes:  None at neck or groin   Psych:  Cooperative, relaxed, affect subdued.  Intake/Output from previous day: 02/04 0701 - 02/05 0700 In: 720 [P.O.:720] Out: 625  [Urine:625] Intake/Output this shift:    LAB RESULTS:  Basename 04/18/11 0500 04/17/11 0625 04/16/11 0610  WBC 5.5 6.2 5.6  HGB 11.7* 12.2 12.3  HCT 36.0 38.5 38.7  PLT 419* 446* 424*   BMET Lab Results  Component Value Date   NA 130* 04/18/2011   NA 128* 04/17/2011   NA 140 04/15/2011   K 4.4 04/18/2011   K 4.1 04/17/2011   K 4.0 04/15/2011   CL 97 04/18/2011   CL  95* 04/17/2011   CL 104 04/15/2011   CO2 23 04/18/2011   CO2 21 04/17/2011   CO2 24 04/15/2011   GLUCOSE 95 04/18/2011   GLUCOSE 111* 04/17/2011   GLUCOSE 129* 04/15/2011   BUN 23 04/18/2011   BUN 28* 04/17/2011   BUN 32* 04/15/2011   CREATININE 1.61* 04/18/2011   CREATININE 1.67* 04/17/2011   CREATININE 1.26* 04/15/2011   CALCIUM 9.4 04/18/2011   CALCIUM 9.1 04/17/2011   CALCIUM 9.1 04/15/2011   LFT Lab Results  Component Value Date   PROT 8.3 04/14/2011   PROT 7.8 03/24/2011   PROT 7.5 08/26/2010   ALBUMIN 2.3* 04/14/2011   ALBUMIN 2.6* 03/24/2011   ALBUMIN 3.7 08/26/2010   AST 17 04/14/2011   AST 27 03/24/2011   AST 36 08/26/2010   ALT 11 04/14/2011   ALT 20 03/24/2011   ALT 26 08/26/2010   ALKPHOS 81 04/14/2011   ALKPHOS 87 03/24/2011   ALKPHOS 67 08/26/2010   BILITOT 0.2* 04/14/2011   BILITOT 0.4 03/24/2011   BILITOT 0.7 08/26/2010   BILIDIR 0.1 08/26/2010   PT/INR Lab Results  Component Value Date   INR 1.15 04/14/2011   INR 0.93 04/17/2010   RADIOLOGY STUDIES: Ct Abdomen Pelvis Wo Contrast  04/16/2011  *RADIOLOGY REPORT*  Clinical Data:  lower abdominal pain, anorexia  CT ABDOMEN AND PELVIS WITHOUT CONTRAST  Technique:  Multidetector CT imaging of the abdomen and pelvis was performed following the standard protocol without intravenous contrast.  Comparison: None except for chest radiograph 04/14/2011  Findings: Marked enlargement of the heart is noted.  No visualized pericardial or pleural effusion.  Minimal dependent bibasilar atelectasis is noted.  Extensive splenic vascular calcification noted. Examination is degraded by patient motion.  At the dome of  the right hepatic lobe, there is a 1.4 cm cyst identified.  Gallbladder, right kidney, adrenal glands, spleen, and pancreas are normal. There is marked enlargement of the left kidney with massive hydronephrosis and apparent cortical thinning, although detail is suboptimal due to lack of contrast.  No surrounding fluid collection or perinephric stranding is identified.  The ureters are normal in caliber bilaterally.  No radiopaque renal or ureteral calculus.  Atherosclerotic vascular calcification noted without aneurysm.  Uterus and ovaries are normal.  The bladder is decompressed but unremarkable. Scattered colonic diverticuli noted without evidence for diverticulitis.  No bowel wall thickening or focal segmental dilatation.  The appendix is not visualized but there is no secondary evidence for acute appendicitis.  No acute osseous finding. Compression fracture/vertebral plana at T11 again noted.  IMPRESSION: Massive left renal hydronephrosis and renal cortical thinning, without radiopaque renal or ureteral calculus.  The ureter is normal in caliber.  Findings are most suggestive of chronic ureteropelvic junction obstruction.  Alternative etiologies could include stricture, nonradiopaque calculus, or neoplasm such as transitional cell carcinoma.  Further outpatient neurological workup could include renal mass protocol MRI with contrast for improved visualization of a potential obstructing enhancing mass lesion.  Original Report Authenticated By: Harrel Lemon, M.D.    ENDOSCOPIC STUDIES: None found  IMPRESSION: 1.  Left sided abdominal pain.  Suspect it is due to the hydronephrosis and ureteropelvic obstruction. ? Could there be element of intestinal ischemia, as it is worse after meals.  She has trace FOB positive stool, no intestinal or gastric lesions on CT.  PLAN: 1.  Per Dr. Rhea Belton. Pt set up for EGD tomorrow.  Case d/w pts daughter, Audley Hose, who works in med records at [employer].  Extension #  is [id number],  cell is [phone].  2.  Will start empiric Pepcid.  Add Miralax for possible narcotic related constipation.   LOS: 4 days   Jennye Moccasin  04/18/2011, 12:05 PM Pager: [phone]

## 2011-04-18 NOTE — Consult Note (Signed)
Patient seen, examined,  and I agree with the above documentation, including the assessment and plan. Left sided abd pain of unclear etiology.  CT reviewed and obvious abnormality is large left kidney hydronephrosis.  It is difficult to know if this has changed in size lately and if so, this could explain abd pain.  It seems to have worsened over the past month.  No definite association with eating or BM.  Family and pt deny issues with constipation at home.  No diarrhea.  No rectal bleeding or melena.  Abd exam with left sided mass consistent with left kidney We discussed with family, and will schedule EGD to r/o PUD/gastritis Trial of acid suppression  Add Miralax to attempt to avoid constipation related to narcotics

## 2011-04-19 ENCOUNTER — Encounter (HOSPITAL_COMMUNITY): Payer: Self-pay | Admitting: Internal Medicine

## 2011-04-19 ENCOUNTER — Encounter (HOSPITAL_COMMUNITY)
Admission: EM | Disposition: A | Discharge status: Home / Self Care | Payer: Self-pay | Source: Home / Self Care | Attending: Cardiology

## 2011-04-19 DIAGNOSIS — R109 Unspecified abdominal pain: Secondary | ICD-10-CM

## 2011-04-19 HISTORY — PX: ESOPHAGOGASTRODUODENOSCOPY: SHX5428

## 2011-04-19 LAB — BASIC METABOLIC PANEL
CO2: 21 mEq/L (ref 19–32)
Chloride: 97 mEq/L (ref 96–112)
Potassium: 4.9 mEq/L (ref 3.5–5.1)
Sodium: 127 mEq/L — ABNORMAL LOW (ref 135–145)

## 2011-04-19 LAB — CBC
MCV: 89.8 fL (ref 78.0–100.0)
Platelets: 382 10*3/uL (ref 150–400)
RBC: 3.91 MIL/uL (ref 3.87–5.11)
WBC: 6.8 10*3/uL (ref 4.0–10.5)

## 2011-04-19 SURGERY — EGD (ESOPHAGOGASTRODUODENOSCOPY)
Anesthesia: Moderate Sedation

## 2011-04-19 MED ORDER — FENTANYL NICU IV SYRINGE 50 MCG/ML
INJECTION | INTRAMUSCULAR | Status: DC | PRN
  Administered 2011-04-19 (×3): 10 ug via INTRAVENOUS

## 2011-04-19 MED ORDER — PANTOPRAZOLE SODIUM 40 MG PO TBEC
40.0000 mg | DELAYED_RELEASE_TABLET | Freq: Every day | ORAL | Status: DC
  Administered 2011-04-20: 40 mg via ORAL
  Filled 2011-04-19: qty 1

## 2011-04-19 MED ORDER — FENTANYL CITRATE 0.05 MG/ML IJ SOLN
INTRAMUSCULAR | Status: AC
  Filled 2011-04-19: qty 2

## 2011-04-19 MED ORDER — SODIUM CHLORIDE 0.9 % IV SOLN
Freq: Once | INTRAVENOUS | Status: AC
  Administered 2011-04-19: 500 mL via INTRAVENOUS

## 2011-04-19 MED ORDER — MIDAZOLAM HCL 10 MG/2ML IJ SOLN
INTRAMUSCULAR | Status: AC
  Filled 2011-04-19: qty 2

## 2011-04-19 MED ORDER — BUTAMBEN-TETRACAINE-BENZOCAINE 2-2-14 % EX AERO
INHALATION_SPRAY | CUTANEOUS | Status: DC | PRN
  Administered 2011-04-19: 1 via TOPICAL

## 2011-04-19 MED ORDER — MIDAZOLAM HCL 10 MG/2ML IJ SOLN
INTRAMUSCULAR | Status: DC | PRN
  Administered 2011-04-19 (×2): 1 mg via INTRAVENOUS

## 2011-04-19 NOTE — Progress Notes (Signed)
Agree with above. Reading the chart, it appears urology does not think intervention is necessary to relieve her urinary obstruction as likely this is chronic.  However, could this be contributing to her left sided abd pain.  No GI cause for the pain discovered to this point.  Imaging shows normal bowel and presence of hydronephrosis. I have spoken with the daughter and given her age, she does not wish to pursue colonoscopy.  She does not feel she could tolerate the preparation.  This is reasonable and low yield for abd pain, given no inflammation, masses, or bowel dilation/obstruction by CT. Call with questions.

## 2011-04-19 NOTE — Progress Notes (Signed)
SUBJECTIVE:  She is still having lower  GI pain and weakness.  No acute SOB.    PHYSICAL EXAM Filed Vitals:   04/18/11 2158 04/18/11 2241 04/19/11 0500 04/19/11 0822  BP: 144/84 144/84 117/76 158/104  Pulse: 86 86 86   Temp: 99.4 F (37.4 C)  98.2 F (36.8 C) 98.1 F (36.7 C)  TempSrc: Other (Comment)  Oral Oral  Resp: 18  19 16   Height:      Weight:   99 lb 10.4 oz (45.2 kg)   SpO2: 96%  94% 96%   General:  No distress Lungs:  Clear Heart:  RRR Abdomen:  Positive bowel sounds, no rebound no guarding but tender to deep palpation lower right and left quadrants.  Extremities:  No edema  LABS: Results for orders placed during the hospital encounter of 04/14/11 (from the past 24 hour(s))  CBC     Status: Abnormal   Collection Time   04/19/11  4:40 AM      Component Value Range   WBC 6.8  4.0 - 10.5 (K/uL)   RBC 3.91  3.87 - 5.11 (MIL/uL)   Hemoglobin 11.1 (*) 12.0 - 15.0 (g/dL)   HCT 13.2 (*) 44.0 - 46.0 (%)   MCV 89.8  78.0 - 100.0 (fL)   MCH 28.4  26.0 - 34.0 (pg)   MCHC 31.6  30.0 - 36.0 (g/dL)   RDW 10.2  72.5 - 36.6 (%)   Platelets 382  150 - 400 (K/uL)  BASIC METABOLIC PANEL     Status: Abnormal   Collection Time   04/19/11  4:40 AM      Component Value Range   Sodium 127 (*) 135 - 145 (mEq/L)   Potassium 4.9  3.5 - 5.1 (mEq/L)   Chloride 97  96 - 112 (mEq/L)   CO2 21  19 - 32 (mEq/L)   Glucose, Bld 103 (*) 70 - 99 (mg/dL)   BUN 20  6 - 23 (mg/dL)   Creatinine, Ser 4.40 (*) 0.50 - 1.10 (mg/dL)   Calcium 9.2  8.4 - 34.7 (mg/dL)   GFR calc non Af Amer 26 (*) >90 (mL/min)   GFR calc Af Amer 30 (*) >90 (mL/min)    Intake/Output Summary (Last 24 hours) at 04/19/11 0827 Last data filed at 04/18/11 2242  Gross per 24 hour  Intake    483 ml  Output    301 ml  Net    182 ml     ASSESSMENT AND PLAN:  1) Dyspnea:   Multifactorial.  This is not currently an acute issue.  No change in therapy  2) UTI:  Bacturia.  Per urology I will stopped the cipro.  This is  chronic contamination.  3) Flutter: The patient is a currently in sinus rhythm. No change in therapy is indicated.  She does have paroxysms but is not a good candidate for other therapies.  4) Diastolic Dysfunction:  She seems to be euvolemic.  At this point, no change in therapy is indicated. No further cardiovascular testing is indicated.  I stopped the Lasix yesterday and she will remain off of diuretic today.  5) Hyponatremia:   Free water restrict.  Na is lower today but creat is at baseline.  No change in therapy.  6) Abnormal CT:  I appreciate the urology consult.  No further studies or therapies planned.  7) Abdominal pain:  I appreciate the GI consult.  EGD planned today.  8)  Disposition:  I will  plan for her to go home in the am.  Rollene Rotunda 04/19/2011 8:27 AM

## 2011-04-19 NOTE — Progress Notes (Signed)
Case d/w Dr Rhea Belton following the EGD today.   He suggests reinvolving urology as the massive hydronephrosis seems the most likely cause of her pain.  The pain is progressively worsening.  Does she need further imaging with MRI?  Or do we simply manage pain with narcotics?  Imaging negative for intestinal masses, colitis or GI findings to explain her pain.  Jennye Moccasin PA-C.

## 2011-04-19 NOTE — Op Note (Signed)
[facility] [address] [place], [place]  [address]  ENDOSCOPY PROCEDURE REPORT  PATIENT:  Yvette, [name]  MR#:  [id number] BIRTHDATE:  1916/12/03, 89 yrs. old  GENDER:  female ENDOSCOPIST:  Carie Caddy. Maelee Hoot, MD Referred by:  Rollene Rotunda, M.D. PROCEDURE DATE:  04/19/2011 PROCEDURE:  EGD, diagnostic 43235 ASA CLASS:  Class III INDICATIONS:  abdominal pain MEDICATIONS:   Fentanyl 30 mcg IV, Versed 2 mg IV TOPICAL ANESTHETIC:  Cetacaine Spray  DESCRIPTION OF PROCEDURE:   After the risks benefits and alternatives of the procedure were thoroughly explained, informed consent was obtained.  The Pentax Gastroscope [id number] endoscope was introduced through the mouth and advanced to the second portion of the duodenum, without limitations.  The instrument was slowly withdrawn as the mucosa was fully examined. <<PROCEDUREIMAGES>>  Mild, LA Grade A esophagitis was found in the distal esophagus.  A small hiatal hernia was found.  Otherwise normal stomach.  The duodenal bulb was normal in appearance, as was the entire examined porton of the postbulbar duodenum.    Retroflexed views revealed findings as previously described.    The scope was then withdrawn from the patient and the procedure completed.  COMPLICATIONS:  None  ENDOSCOPIC IMPRESSION: 1) Mild esophagitis in the distal esophagus 2) Small hiatal hernia 3) Otherwise normal stomach 4) Normal duodenum  RECOMMENDATIONS: 1) Change famotidine to pantoprazole 40 mg daily. 2) No findings to explain abdominal pain.  If this continues, would consider repeat imaging.  Carie Caddy. Rasool Rommel, MD  CC:  The Patient  n. eSIGNED:   Carie Caddy. Joshawa Dubin at 04/19/2011 09:26 AM  Leslie Dales, [phone]

## 2011-04-20 ENCOUNTER — Encounter (HOSPITAL_COMMUNITY): Payer: Self-pay | Admitting: Internal Medicine

## 2011-04-20 LAB — CBC
Hemoglobin: 11.1 g/dL — ABNORMAL LOW (ref 12.0–15.0)
MCHC: 31.5 g/dL (ref 30.0–36.0)
WBC: 6.6 10*3/uL (ref 4.0–10.5)

## 2011-04-20 LAB — BASIC METABOLIC PANEL
Chloride: 100 mEq/L (ref 96–112)
GFR calc Af Amer: 35 mL/min — ABNORMAL LOW (ref >90)
GFR calc non Af Amer: 30 mL/min — ABNORMAL LOW (ref >90)
Glucose, Bld: 101 mg/dL — ABNORMAL HIGH (ref 70–99)
Potassium: 4.8 mEq/L (ref 3.5–5.1)
Sodium: 131 mEq/L — ABNORMAL LOW (ref 135–145)

## 2011-04-20 MED ORDER — TRAMADOL HCL 50 MG PO TABS: 50.0000 mg | ORAL_TABLET | Freq: Four times a day (QID) | ORAL | Status: AC | PRN

## 2011-04-20 NOTE — Progress Notes (Signed)
SUBJECTIVE:  She is still having lower  GI pain and weakness.  No acute SOB.    PHYSICAL EXAM Filed Vitals:   04/19/11 0954 04/19/11 1346 04/19/11 2057 04/20/11 0539  BP: 92/44 142/81 118/59 146/78  Pulse:  84 98 86  Temp:  98.6 F (37 C) 100 F (37.8 C) 98.5 F (36.9 C)  TempSrc:  Oral Oral Oral  Resp: 20 19 18 20   Height:      Weight:    44.589 kg (98 lb 4.8 oz)  SpO2: 98% 96% 95% 97%   General:  No distress Lungs:  Clear Heart:  RRR Abdomen:  Positive bowel sounds, no rebound no guarding but tender to deep palpation lower right and left quadrants.  Extremities:  No edema  LABS: Results for orders placed during the hospital encounter of 04/14/11 (from the past 24 hour(s))  CBC     Status: Abnormal   Collection Time   04/20/11  5:12 AM      Component Value Range   WBC 6.6  4.0 - 10.5 (K/uL)   RBC 3.92  3.87 - 5.11 (MIL/uL)   Hemoglobin 11.1 (*) 12.0 - 15.0 (g/dL)   HCT 16.1 (*) 09.6 - 46.0 (%)   MCV 89.8  78.0 - 100.0 (fL)   MCH 28.3  26.0 - 34.0 (pg)   MCHC 31.5  30.0 - 36.0 (g/dL)   RDW 04.5  40.9 - 81.1 (%)   Platelets 413 (*) 150 - 400 (K/uL)  BASIC METABOLIC PANEL     Status: Abnormal   Collection Time   04/20/11  5:12 AM      Component Value Range   Sodium 131 (*) 135 - 145 (mEq/L)   Potassium 4.8  3.5 - 5.1 (mEq/L)   Chloride 100  96 - 112 (mEq/L)   CO2 22  19 - 32 (mEq/L)   Glucose, Bld 101 (*) 70 - 99 (mg/dL)   BUN 18  6 - 23 (mg/dL)   Creatinine, Ser 9.14 (*) 0.50 - 1.10 (mg/dL)   Calcium 9.5  8.4 - 78.2 (mg/dL)   GFR calc non Af Amer 30 (*) >90 (mL/min)   GFR calc Af Amer 35 (*) >90 (mL/min)    Intake/Output Summary (Last 24 hours) at 04/20/11 0837 Last data filed at 04/19/11 1700  Gross per 24 hour  Intake    480 ml  Output    551 ml  Net    -71 ml     ASSESSMENT AND PLAN:  1) Dyspnea:   Multifactorial.  This is not currently an acute issue.  No change in therapy  2) UTI:  Bacturia.  Per urology I will stopped the cipro.  This is chronic  contamination.  3) Flutter: The patient is a currently in sinus rhythm. No change in therapy is indicated.  She does have paroxysms but is not a good candidate for other therapies.  4) Diastolic Dysfunction:  She seems to be euvolemic.  At this point, no change in therapy is indicated. No further cardiovascular testing is indicated.  I stopped the Lasix this admission.  5) Hyponatremia:   Free water restrict.  Na is better today and creat is slightly better.  I will not send her home with Lasix. However, we need to see her in less Cherish 7 days to decide on changes in therapy.  6) Abnormal CT:  I appreciate the urology consult.  No further studies or therapies planned.  7) Abdominal pain:  I appreciate  the GI consult.  EGD without etiology.  This is going to be a chronic problem and she needs pain management.  She can go home with a prescription for Tramadol.  8)  Disposition:  I will plan for her to go home today.  Call her daughter China ([phone]).  She will need to review the discharge instructions and be the one who takes her home.  Rollene Rotunda 04/20/2011 8:37 AM

## 2011-04-20 NOTE — Discharge Summary (Signed)
CARDIOLOGY DISCHARGE SUMMARY   Patient ID: Yvette [name] MRN: [id number] DOB/AGE: 1917/02/26 78 y.o.  Admit date: 04/14/2011 Discharge date: 04/20/2011  Primary Discharge Diagnosis:  Chest and abdominal pain Secondary Discharge Diagnosis:  Patient Active Problem List  Diagnoses   Hyponatremia  . Paroxysmal atrial flutter  . Hypertension  . Chronic diastolic heart failure  . Sick sinus syndrome  . Chest pain  . Anxiety  . HYPERTENSION  . ATRIAL FLUTTER  . BRADYCARDIA-TACHYCARDIA SYNDROME  . DIASTOLIC HEART FAILURE, ACUTE  . Dizziness  . Diaphoresis  . Left sided abdominal pain    Consults: GI, Urology  Procedures: Ct Abdomen Pelvis Wo Contrast, [facility]  Hospital Course: Yvette [name] is a 78 year old female with a history of diastolic CHF and presumed CAD, treated medically. She became acutely more short of breath. Her heart rate was 110 when taken by her daughter. She also was so weak that she fell and she reported being cold all over. She was transported to the ER and her EKG  demonstrated some inferior ST elevation. This was slightly more pronounced Joana an old EKG and could have been related to her increased rate. After discussion with the daughter she will not be taken urgently to the cath lab. She was admitted for further evaluation.  She had significant abdominal pain and a CT showed hydronephrosis. A urology consult was called and for the pyuria/bacteriuria - asymptomatic-if she remains afebrile and has no dysuria, I would not treat her for UTI. Her UA has bacteria and pyuria for over 1 year. For the left hydronephrosis - this appears to be chronic UPJ obstruction as the left kidney parenchyma is thin and no further workup was recommended. Because of the abdominal pain, a  GI consult was called. She had an EGD which showed:  1) Mild esophagitis in the distal esophagus  2) Small hiatal hernia  3) Otherwise normal stomach  4) Normal duodenum  She was placed on Protonix and pain  control meds. On 04/20/2011, she was seen by Dr Antoine Poche. She was still having GI pain and weakness but he did not feel any further in-patient workup was indicated. The family is aware. She is considered stable for discharge, to follow up as an outpatient.    Labs:   Lab Results  Component Value Date   WBC 6.6 04/20/2011   HGB 11.1* 04/20/2011   HCT 35.2* 04/20/2011   MCV 89.8 04/20/2011   PLT 413* 04/20/2011    Lab 04/20/11 0512 04/14/11 1035  NA 131* --  K 4.8 --  CL 100 --  CO2 22 --  BUN 18 --  CREATININE 1.46* --  CALCIUM 9.5 --  PROT -- 8.3  BILITOT -- 0.2*  ALKPHOS -- 81  ALT -- 11  AST -- 17  GLUCOSE 101* --    Lipid Panel     Component Value Date/Time   CHOL 149 04/15/2011 0545   TRIG 93 04/15/2011 0545   HDL 51 04/15/2011 0545   CHOLHDL 2.9 04/15/2011 0545   VLDL 19 04/15/2011 0545   LDLCALC 79 04/15/2011 0545    Radiology: Ct Abdomen Pelvis Wo Contrast  04/16/2011  *RADIOLOGY REPORT*  Clinical Data:  lower abdominal pain, anorexia  CT ABDOMEN AND PELVIS WITHOUT CONTRAST  Technique:  Multidetector CT imaging of the abdomen and pelvis was performed following the standard protocol without intravenous contrast.  Comparison: None except for chest radiograph 04/14/2011  Findings: Marked enlargement of the heart is noted.  No visualized  pericardial or pleural effusion.  Minimal dependent bibasilar atelectasis is noted.  Extensive splenic vascular calcification noted. Examination is degraded by patient motion.  At the dome of the right hepatic lobe, there is a 1.4 cm cyst identified.  Gallbladder, right kidney, adrenal glands, spleen, and pancreas are normal. There is marked enlargement of the left kidney with massive hydronephrosis and apparent cortical thinning, although detail is suboptimal due to lack of contrast.  No surrounding fluid collection or perinephric stranding is identified.  The ureters are normal in caliber bilaterally.  No radiopaque renal or ureteral calculus.  Atherosclerotic  vascular calcification noted without aneurysm.  Uterus and ovaries are normal.  The bladder is decompressed but unremarkable. Scattered colonic diverticuli noted without evidence for diverticulitis.  No bowel wall thickening or focal segmental dilatation.  The appendix is not visualized but there is no secondary evidence for acute appendicitis.  No acute osseous finding. Compression fracture/vertebral plana at T11 again noted.  IMPRESSION: Massive left renal hydronephrosis and renal cortical thinning, without radiopaque renal or ureteral calculus.  The ureter is normal in caliber.  Findings are most suggestive of chronic ureteropelvic junction obstruction.  Alternative etiologies could include stricture, nonradiopaque calculus, or neoplasm such as transitional cell carcinoma.  Further outpatient neurological workup could include renal mass protocol MRI with contrast for improved visualization of a potential obstructing enhancing mass lesion.  Original Report Authenticated By: Harrel Lemon, M.D.   Dg Abd 1 View  03/24/2011  *RADIOLOGY REPORT*  Clinical Data: Abdominal pain.  ABDOMEN - 1 VIEW  Comparison: None.  Findings: The bowel gas pattern is unremarkable.  No unexpected abdominal calcification.  L2 compression fracture deformity is noted.  IMPRESSION: Normal appearing bowel gas pattern.  Original Report Authenticated By: Bernadene Bell. Maricela Curet, M.D.   Dg Chest Port 1 View  04/14/2011  *RADIOLOGY REPORT*  Clinical Data: Short of breath.  Abnormal EKG.  Acute myocardial infarction.  PORTABLE CHEST - 1 VIEW  Comparison: 08/26/2010 radiograph.  Findings: There is a left greater [place] right basilar airspace disease.  Differential considerations include asymmetric pulmonary edema, aspiration pneumonitis or less likely pneumonia.  No definite pleural effusion is identified.  Cardiomegaly. Atherosclerosis.  Emphysema.  Calcification of the tracheobronchial tree. Monitoring leads are projected over the chest.   IMPRESSION: Cardiomegaly.  Asymmetric left greater [place] right basilar airspace disease and atelectasis.  Favor asymmetric pulmonary edema over aspiration or pneumonia.  Underlying emphysema.  Original Report Authenticated By: Andreas Newport, M.D.    EKG:17-Apr-2011 02:53:14  Supraventricular tachycardia Marked ST abnormality, possible lateral subendocardial injury Abnormal ECG Since last tracing rate slower    FOLLOW UP PLANS AND APPOINTMENTS Discharge Orders    Future Appointments: Provider: Department: Dept Phone: Center:   04/24/2011 12:00 PM Jacolyn Reedy, PA [facility] [phone] [facility]   04/26/2011 1:45 PM Majel Homer, MD [facility] [phone] [facility]   05/29/2011 4:30 PM Rollene Rotunda, MD [facility] [phone] [facility]     No Known Allergies Medication List  As of 04/20/2011  2:18 PM   STOP taking these medications         isosorbide mononitrate 30 MG 24 hr tablet      torsemide 20 MG tablet         TAKE these medications         aspirin 81 MG chewable tablet   Chew 81 mg by mouth daily.      FISH OIL PO   Take 1 capsule by mouth daily.  Lysine 500 MG Caps   Take 500 mg by mouth 2 (two) times daily.      Melatonin 3 MG Tabs   Take 3 mg by mouth at bedtime.      pindolol 5 MG tablet   Commonly known as: VISKEN   Take 0.5 tablets (2.5 mg total) by mouth 2 (two) times daily.      traMADol 50 MG tablet   Commonly known as: ULTRAM   Take 1 tablet (50 mg total) by mouth every 6 (six) hours as needed for pain.           Follow-up Information    Follow up with RITCH,ERIK, MD in 2 weeks. (As needed)       Follow up with Jacolyn Reedy, PA. (February 11th, noon )    Contact information:   [address] [place] [address] [phone]          BRING ALL MEDICATIONS WITH YOU TO FOLLOW UP APPOINTMENTS  Time spent with patient to include physician time: 33  min Signed: Theodore Demark 04/20/2011, 2:18 PM Co-Sign MD  Patient seen and examined.  Plan as discussed in my rounding note for today and outlined above. Rollene Rotunda  04/20/2011  3:33 PM

## 2011-04-24 ENCOUNTER — Encounter: Payer: Medicaid Other | Admitting: Physician Assistant

## 2011-04-26 ENCOUNTER — Ambulatory Visit (INDEPENDENT_AMBULATORY_CARE_PROVIDER_SITE_OTHER): Payer: Medicaid Other | Admitting: Family Medicine

## 2011-04-26 ENCOUNTER — Encounter: Payer: Self-pay | Admitting: Family Medicine

## 2011-04-26 DIAGNOSIS — I509 Heart failure, unspecified: Secondary | ICD-10-CM

## 2011-04-26 DIAGNOSIS — I10 Essential (primary) hypertension: Secondary | ICD-10-CM

## 2011-04-26 DIAGNOSIS — N184 Chronic kidney disease, stage 4 (severe): Secondary | ICD-10-CM | POA: Insufficient documentation

## 2011-04-26 DIAGNOSIS — R5381 Other malaise: Secondary | ICD-10-CM

## 2011-04-26 DIAGNOSIS — I5032 Chronic diastolic (congestive) heart failure: Secondary | ICD-10-CM

## 2011-04-26 DIAGNOSIS — R109 Unspecified abdominal pain: Secondary | ICD-10-CM

## 2011-04-26 DIAGNOSIS — I4892 Unspecified atrial flutter: Secondary | ICD-10-CM

## 2011-04-26 HISTORY — DX: Other malaise: R53.81

## 2011-04-26 NOTE — Progress Notes (Signed)
Subjective: The patient is a 78 y.o. year old female who presents today for establishing care.  Pt primarily does not have complaints.  Her once concern is pain in left side of belly.  Has been seen by GI and deemed her not a candidate for any additional interventions.  EGD performed earlier this month demonstrated some mild esophagitis but no other findings.  Has been taking tramadol for this.  Not entirely certain what this caused by.  Tramadol seems to help.  Otherwise pt is without complaints.  Review of medical history reveals the following: Past Medical History  Diagnosis Date  . Paroxysmal atrial flutter [facility] admission 2/5-04/20/2010    High fall risk-not a Coumadin candidate  . Hypertension   . Chronic diastolic heart failure     Echo February 2012: EF 55%; moderate LVH; mild AI; mild MR; PASP 39  . Sick sinus syndrome     Limits use of beta blocker-pindolol tolerated  . NSTEMI (non-ST elevated myocardial infarction)     Troponin 0.12, 0.13, 0.08 during admission 04/2010  . Angina   . Shortness of breath   . Arthritis   . Anxiety    Past Surgical History  Procedure Date  . Esophagogastroduodenoscopy 04/19/2011    Procedure: ESOPHAGOGASTRODUODENOSCOPY (EGD);  Surgeon: Erick Blinks, MD;  Location: [facility] ENDOSCOPY;  Service: Gastroenterology;  Laterality: N/A;   History   Social History  . Marital Status: Widowed    Spouse Name: N/A    Number of Children: N/A  . Years of Education: N/A   Occupational History  . Not on file.   Social History Main Topics  . Smoking status: Never Smoker   . Smokeless tobacco: Not on file  . Alcohol Use: No  . Drug Use: No  . Sexually Active: No   Other Topics Concern  . Not on file   Social History Narrative  . No narrative on file   Objective:  Filed Vitals:   04/26/11 1354  BP: 118/78  Pulse: 68  Temp: 98.2 F (36.8 C)   Gen: NAD, appropriate, very weak HEENT: No JVD, neck supple, MMM CV: Occasionally irregular, no murmurs  appreciated Resp: CTABL, no crackles Ext: No edema  Assessment/Plan:  Please also see individual problems in problem list for problem-specific plans.

## 2011-04-26 NOTE — Patient Instructions (Signed)
It was great to meet you today! I am sending in a request for [facility] to see you about physical therapy. Come back to see me in about a month.

## 2011-04-28 ENCOUNTER — Telehealth: Payer: Self-pay | Admitting: Family Medicine

## 2011-04-28 NOTE — Telephone Encounter (Signed)
rec'd orders for her to have PT, but she has straight medicaid and they will not pay for PT

## 2011-05-01 ENCOUNTER — Ambulatory Visit (INDEPENDENT_AMBULATORY_CARE_PROVIDER_SITE_OTHER): Payer: Medicaid Other | Admitting: Physician Assistant

## 2011-05-01 ENCOUNTER — Encounter: Payer: Self-pay | Admitting: Physician Assistant

## 2011-05-01 VITALS — BP 146/75 | HR 87 | Ht <= 58 in | Wt 102.0 lb

## 2011-05-01 DIAGNOSIS — R0600 Dyspnea, unspecified: Secondary | ICD-10-CM

## 2011-05-01 DIAGNOSIS — R109 Unspecified abdominal pain: Secondary | ICD-10-CM

## 2011-05-01 DIAGNOSIS — I4892 Unspecified atrial flutter: Secondary | ICD-10-CM

## 2011-05-01 DIAGNOSIS — R0989 Other specified symptoms and signs involving the circulatory and respiratory systems: Secondary | ICD-10-CM

## 2011-05-01 DIAGNOSIS — R0609 Other forms of dyspnea: Secondary | ICD-10-CM

## 2011-05-01 DIAGNOSIS — I5032 Chronic diastolic (congestive) heart failure: Secondary | ICD-10-CM

## 2011-05-01 DIAGNOSIS — I10 Essential (primary) hypertension: Secondary | ICD-10-CM

## 2011-05-01 DIAGNOSIS — K209 Esophagitis, unspecified without bleeding: Secondary | ICD-10-CM

## 2011-05-01 DIAGNOSIS — I509 Heart failure, unspecified: Secondary | ICD-10-CM

## 2011-05-01 MED ORDER — PANTOPRAZOLE SODIUM 40 MG PO TBEC: 40.0000 mg | DELAYED_RELEASE_TABLET | Freq: Every day | ORAL | Status: DC

## 2011-05-01 MED ORDER — ACETAMINOPHEN 500 MG PO TABS: 500.0000 mg | ORAL_TABLET | Freq: Two times a day (BID) | ORAL | Status: AC | PRN

## 2011-05-01 NOTE — Telephone Encounter (Signed)
Spoke with Efraim Kaufmann and she informed me that the only way for medicaid to pay for these services is if pt has had a total hip or joint replacement.  She did suggest that they can send out a nurse to access the needs of the pt if this works better. Will forward to pcp for advice.Laureen Ochs, Viann Shove

## 2011-05-01 NOTE — Telephone Encounter (Signed)
Referral placed for assessment.Yvette [name]

## 2011-05-01 NOTE — Progress Notes (Signed)
[address] [place], [place]  [address] Phone: [phone] Fax:  [phone]  Date:  05/01/2011   Name:  Yvette [name]       DOB:  21-Nov-1916 MRN:  [id number]  PCP:  Dr. Majel Homer Primary Cardiologist:  Dr. Rollene Rotunda  Primary Electrophysiologist:  None    History of Present Illness: Yvette [name] is a 78 y.o. [place] female who presents for post hospital follow up.    She has a h/o atypical atrial flutter.  She presented to the hospital in 04/2010 and spontaneously converted to normal sinus rhythm.  She was also diuresed for diastolic heart failure.  Her echocardiogram demonstrated an EF of 55% with moderate LVH, mild AI, mild MR, PASP 39.  She demonstrated signs of tachybradycardia syndrome with beta blocker therapy and she was switched to pindolol.  She also ruled in for a NSTEMI and she was treated conservatively.  She is presumed to have CAD.  She was last seen by Dr. Rollene Rotunda in 02/2011.  She had recently been switched to Torsemide for CHF and was doing better.    She was then admitted 2/1-2/7 with chest pain and dyspnea.  This was suspicious for unstable angina.  ECG was also concerning for inferior ST elevation.  Conservative management was pursued by her family and she was not taken to the cath lab.  Her cardiac markers remained negative.  There was a question of a/c diastolic CHF.  She was gently diuresed.  She had bursts of SVT/AFlutter.  She is not felt to be a coumadin candidate.  She had an abdominal CT done due to abdominal pain and this demonstrated hydronephrosis.  She also had pyuria.  This was asymptomatic.  No tx was recommended by urology.  Hydronephrosis was thought to be due to chronic UPJ obstruction and no further workup was recommended.  GI also saw her for her pain and an EGD demonstrated mild esophagitis, small hiatal hernia.  She was placed on PPI.  Of note, she has low Na and had fluid restrictions.  Labs: Hgb 11.1, Na 131, K 4.8,  creatinine 1.46, ALT 11, TC 149, TG 93, HDL 51, LDL 79.    She is stable.  Notes continued LLQ pain.  Tramadol helps only a little.  Weights are stable at home (100 lbs) without change.  She complains of feeling tired.  Sleeps on 3 pillows chronically.  No PND or edema.  No chest pain.  No syncope.  Has occasional palpitations.    Past Medical History  Diagnosis Date  . Paroxysmal atrial flutter [facility] admission 2/5-04/20/2010    High fall risk-not a Coumadin candidate  . Hypertension   . Chronic diastolic heart failure     Echo February 2012: EF 55%; moderate LVH; mild AI; mild MR; PASP 39  . Sick sinus syndrome     Limits use of beta blocker-pindolol tolerated  . NSTEMI (non-ST elevated myocardial infarction)     Troponin 0.12, 0.13, 0.08 during admission 04/2010  . Angina   . Shortness of breath   . Arthritis   . Anxiety     Current Outpatient Prescriptions  Medication Sig Dispense Refill  . aspirin 81 MG chewable tablet Chew 81 mg by mouth daily.      Marland Kitchen Lysine 500 MG CAPS Take 500 mg by mouth 2 (two) times daily.       . Melatonin 3 MG TABS Take 3 mg by mouth at bedtime.       Marland Kitchen  Omega-3 Fatty Acids (FISH OIL PO) Take 1 capsule by mouth daily.      . pindolol (VISKEN) 5 MG tablet Take 0.5 tablets (2.5 mg total) by mouth 2 (two) times daily.  90 tablet  3  . traMADol (ULTRAM) 50 MG tablet Take 1 tablet (50 mg total) by mouth every 6 (six) hours as needed for pain.  30 tablet  0    Allergies: No Known Allergies  History  Substance Use Topics  . Smoking status: Never Smoker   . Smokeless tobacco: Not on file  . Alcohol Use: No     ROS:  Please see the history of present illness.    All other systems reviewed and negative.   PHYSICAL EXAM: VS:  BP 146/75  Pulse 87  Ht 4\' 9"  (1.448 m)  Wt 102 lb (46.267 kg)  BMI 22.07 kg/m2 Well nourished, well developed, in no acute distress HEENT: normal Neck: no JVD at 90 degrees Cardiac:  normal S1, S2; RRR; no murmur Lungs:  clear to  auscultation bilaterally, no wheezing, rhonchi or rales Abd: soft, nontender  Ext: no edema Skin: warm and dry Neuro:  CNs 2-12 intact, no focal abnormalities noted  EKG:  NSR, HR 86, LVH with repol abnormality, no change from prior  ASSESSMENT AND PLAN:

## 2011-05-01 NOTE — Assessment & Plan Note (Signed)
Fair control.  Continue current therapy. 

## 2011-05-01 NOTE — Assessment & Plan Note (Signed)
Likely related to chronic hydronephrosis.  No intervention was recommended.  Continue with pain control.  We discussed using Tylenol 500 mg bid and taking Tramadol prn to see if this helps.  I also advised her to follow up with PCP to see if other strategies can be tried.  I would not recommend narcotics as this may cause constipation which would lead to more pain.

## 2011-05-01 NOTE — Assessment & Plan Note (Signed)
Mild by EGD.  EGD report indicates she is supposed to be on a PPI.  Will give her Protonix 40 mg qd.

## 2011-05-01 NOTE — Assessment & Plan Note (Signed)
Maintaining NSR.  She is not a candidate for coumadin.

## 2011-05-01 NOTE — Telephone Encounter (Signed)
Called to find out other options for this pt. Melissa will call back with anything she finds out concerning this.Yvette [name]

## 2011-05-01 NOTE — Assessment & Plan Note (Signed)
Volume appears stable.  She complains of being tired.  She is no longer on diuretics.  Weights at home ok.  Exam unremarkable today.  Will check follow up BMET and BNP.  If BNP very high, add low dose torsemide back.  Otherwise, I have given her daughter instructions to use Torsemide 10 mg QD prn weight gain of 2 lbs or more in 24 hrs.  Follow up with Dr. Rollene Rotunda next month as scheduled.

## 2011-05-01 NOTE — Telephone Encounter (Signed)
If they will not pay for PT but will pay for a nurse to go out and assess for any home needs, lets do that.  Why medicaid will not cover PT for this patient who is an extreme fall risk and would greatly benefit from it is beyond me.

## 2011-05-01 NOTE — Patient Instructions (Addendum)
Weigh daily:  Take Torsemide 20 mg 1/2 tablet if weight up 2-3 lbs in one day, increased swelling or increased dyspnea.  Your physician recommends that you schedule a follow-up appointment as scheduled Your physician recommends that you have lab work today (BMP and BNP) Your physician has recommended you make the following change in your medication:  START Protonix 40 mg daily, and try taking Tylenol 500 mg twice daily, continue taking Tramadol as needed for pain

## 2011-05-02 ENCOUNTER — Telehealth: Payer: Self-pay | Admitting: *Deleted

## 2011-05-02 DIAGNOSIS — I5032 Chronic diastolic (congestive) heart failure: Secondary | ICD-10-CM

## 2011-05-02 DIAGNOSIS — I10 Essential (primary) hypertension: Secondary | ICD-10-CM

## 2011-05-02 LAB — BASIC METABOLIC PANEL
CO2: 20 mEq/L (ref 19–32)
Chloride: 103 mEq/L (ref 96–112)
Potassium: 5 mEq/L (ref 3.5–5.1)

## 2011-05-02 LAB — BRAIN NATRIURETIC PEPTIDE: Pro B Natriuretic peptide (BNP): 356 pg/mL — ABNORMAL HIGH (ref 0.0–100.0)

## 2011-05-02 NOTE — Telephone Encounter (Signed)
lmom for ptcb to discuss lab results and recommendations per Tereso Newcomer, PA-C. Danielle Rankin

## 2011-05-02 NOTE — Telephone Encounter (Signed)
Message copied by Tarri Fuller on Tue May 02, 2011  4:41 PM ------      Message from: [place], [place] T      Created: Tue May 02, 2011 12:36 PM       Call her daughter to have the patient take Torsemide 20 mg 1/2 tablet daily on Mon, Wed, Fri only for now.      Repeat BMET and BNP in one week.      Weigh daily and call if:  Weight up 2-3 lbs in one day, increased swelling or increased dyspnea.       Tereso Newcomer, PA-C  12:35 PM 05/02/2011

## 2011-05-03 ENCOUNTER — Encounter: Payer: Self-pay | Admitting: *Deleted

## 2011-05-03 NOTE — Telephone Encounter (Signed)
I s/w pt's daughter today about pt's lab results and med dose changes for torsemide 10 mg on m, w, fri's only, repeat bmet/bnp 05/11/11. Danielle Rankin Lab order placed today bmet/bnp 05/11/11

## 2011-05-03 NOTE — Telephone Encounter (Signed)
Follow up from previous call:  Returning call back to [facility].

## 2011-05-11 ENCOUNTER — Other Ambulatory Visit (INDEPENDENT_AMBULATORY_CARE_PROVIDER_SITE_OTHER): Payer: Medicaid Other

## 2011-05-11 DIAGNOSIS — I10 Essential (primary) hypertension: Secondary | ICD-10-CM

## 2011-05-11 DIAGNOSIS — I5032 Chronic diastolic (congestive) heart failure: Secondary | ICD-10-CM

## 2011-05-11 DIAGNOSIS — I509 Heart failure, unspecified: Secondary | ICD-10-CM

## 2011-05-11 LAB — BASIC METABOLIC PANEL
Chloride: 109 mEq/L (ref 96–112)
Potassium: 4.2 mEq/L (ref 3.5–5.1)

## 2011-05-11 LAB — BRAIN NATRIURETIC PEPTIDE: Pro B Natriuretic peptide (BNP): 297 pg/mL — ABNORMAL HIGH (ref 0.0–100.0)

## 2011-05-12 ENCOUNTER — Telehealth: Payer: Self-pay | Admitting: *Deleted

## 2011-05-12 NOTE — Telephone Encounter (Signed)
lmom labs ok, ptcb if increased SOB. Danielle Rankin

## 2011-05-12 NOTE — Telephone Encounter (Signed)
Message copied by Tarri Fuller on Fri May 12, 2011 11:27 AM ------      Message from: [place], [place] T      Created: Thu May 11, 2011  5:34 PM       Please notify patient that the lab results are ok.      Let us know if she is feeling more short of breath.      Tereso Newcomer, PA-C  5:33 PM 05/11/2011

## 2011-05-18 NOTE — Assessment & Plan Note (Signed)
Rate controlled.  No additional interventions.

## 2011-05-18 NOTE — Assessment & Plan Note (Signed)
Will attempt to arrange for PT if possible.

## 2011-05-18 NOTE — Assessment & Plan Note (Signed)
Not certain as to the etiology but patient has had specialist workup.  I will be happy to continue providing tramadol as needed.

## 2011-05-18 NOTE — Assessment & Plan Note (Signed)
No evidence of acute heart failure today, no additional interventions

## 2011-05-23 ENCOUNTER — Ambulatory Visit: Payer: Medicaid Other | Admitting: Family Medicine

## 2011-05-29 ENCOUNTER — Encounter: Payer: Self-pay | Admitting: Cardiology

## 2011-05-29 ENCOUNTER — Ambulatory Visit (INDEPENDENT_AMBULATORY_CARE_PROVIDER_SITE_OTHER): Payer: Medicaid Other | Admitting: Cardiology

## 2011-05-29 VITALS — BP 114/66 | HR 65 | Ht <= 58 in | Wt 103.0 lb

## 2011-05-29 DIAGNOSIS — I5032 Chronic diastolic (congestive) heart failure: Secondary | ICD-10-CM

## 2011-05-29 DIAGNOSIS — I509 Heart failure, unspecified: Secondary | ICD-10-CM

## 2011-05-29 DIAGNOSIS — I4892 Unspecified atrial flutter: Secondary | ICD-10-CM

## 2011-05-29 DIAGNOSIS — R109 Unspecified abdominal pain: Secondary | ICD-10-CM

## 2011-05-29 NOTE — Assessment & Plan Note (Signed)
This is likely related to chronic hydronephrosis which is going to be managed medically. No change in therapy is indicated. She's doing well with the current pain management.

## 2011-05-29 NOTE — Assessment & Plan Note (Signed)
She seems to be euvolemic.  At this point, no change in therapy is indicated.  We have reviewed salt and fluid restrictions.  No further cardiovascular testing is indicated.   

## 2011-05-29 NOTE — Progress Notes (Signed)
   HPI The patient for follow up of arrhythmias, dyspnea and abdominal pain.  She was seen after her last hospitalization by Tereso Newcomer PAc.  He treated her intermittent abdominal discomfort with when necessary Ultram. This seems to have helped quite a bit. She's taking this every 3 or 4 days. She's not had any new shortness of breath, PND or orthopnea. She's had no palpitations, presyncope or syncope. She's had no chest pressure, neck or arm discomfort.   No Known Allergies  Current Outpatient Prescriptions  Medication Sig Dispense Refill  . acetaminophen (TYLENOL) 500 MG tablet Take 1 tablet (500 mg total) by mouth every 12 (twelve) hours as needed for pain.  100 tablet  2  . aspirin 81 MG chewable tablet Chew 81 mg by mouth daily.      Marland Kitchen Lysine 500 MG CAPS Take 500 mg by mouth 2 (two) times daily.       . Melatonin 3 MG TABS Take 3 mg by mouth at bedtime.       . Omega-3 Fatty Acids (FISH OIL PO) Take 1 capsule by mouth daily.      . pantoprazole (PROTONIX) 40 MG tablet Take 1 tablet (40 mg total) by mouth daily.  30 tablet  6  . pindolol (VISKEN) 5 MG tablet Take 0.5 tablets (2.5 mg total) by mouth 2 (two) times daily.  90 tablet  3  . torsemide (DEMADEX) 20 MG tablet Take 10 mg by mouth three times a week        Past Medical History  Diagnosis Date  . Paroxysmal atrial flutter [facility] admission 2/5-04/20/2010    High fall risk-not a Coumadin candidate  . Hypertension   . Chronic diastolic heart failure     Echo February 2012: EF 55%; moderate LVH; mild AI; mild MR; PASP 39  . Sick sinus syndrome     Limits use of beta blocker-pindolol tolerated  . NSTEMI (non-ST elevated myocardial infarction)     Troponin 0.12, 0.13, 0.08 during admission 04/2010  . Angina   . Shortness of breath   . Arthritis   . Anxiety     Past Surgical History  Procedure Date  . Esophagogastroduodenoscopy 04/19/2011    Procedure: ESOPHAGOGASTRODUODENOSCOPY (EGD);  Surgeon: Erick Blinks, MD;  Location: [facility]  ENDOSCOPY;  Service: Gastroenterology;  Laterality: N/A;    ROS: As stated in the HPI and negative for all other systems. (Per her daughter.)  PHYSICAL EXAM BP 114/66  Pulse 65  Ht 4\' 9"  (1.448 m)  Wt 103 lb (46.72 kg)  BMI 22.29 kg/m2 PHYSICAL EXAM GEN:  No distress NECK:  No jugular venous distention at 90 degrees, waveform within normal limits, carotid upstroke brisk and symmetric, no bruits, no thyromegaly LYMPHATICS:  No cervical adenopathy LUNGS:  Clear to auscultation bilaterally BACK:  No CVA tenderness CHEST:  Unremarkable HEART:  S1 and S2 within normal limits, no S3, no S4, no clicks, no rubs, no murmurs ABD:  Positive bowel sounds normal in frequency in pitch, no bruits, no rebound, no guarding, unable to assess midline mass or bruit with the patient seated. EXT:  2 plus pulses throughout, trace edema, no cyanosis no clubbing SKIN:  No rashes no nodules NEURO:  Cranial nerves II through XII grossly intact, motor grossly intact throughout PSYCH:  Cognitively intact, oriented to person place and time   ASSESSMENT AND PLAN

## 2011-05-29 NOTE — Patient Instructions (Signed)
The current medical regimen is effective;  continue present plan and medications.  Follow up in 6 months with Dr Hochrein.  You will receive a letter in the mail 2 months before you are due.  Please call us when you receive this letter to schedule your follow up appointment.  

## 2011-05-29 NOTE — Assessment & Plan Note (Signed)
She is having no symptomatic palpitations.  At this point she needs no change in therapy. She will continue on the meds as listed.

## 2011-07-25 ENCOUNTER — Encounter: Payer: Self-pay | Admitting: Nurse Practitioner

## 2011-07-25 ENCOUNTER — Ambulatory Visit (INDEPENDENT_AMBULATORY_CARE_PROVIDER_SITE_OTHER): Payer: Medicaid Other | Admitting: Nurse Practitioner

## 2011-07-25 VITALS — BP 142/78 | HR 81 | Resp 18 | Ht <= 58 in | Wt 105.8 lb

## 2011-07-25 DIAGNOSIS — I509 Heart failure, unspecified: Secondary | ICD-10-CM

## 2011-07-25 DIAGNOSIS — I5032 Chronic diastolic (congestive) heart failure: Secondary | ICD-10-CM

## 2011-07-25 DIAGNOSIS — I10 Essential (primary) hypertension: Secondary | ICD-10-CM

## 2011-07-25 MED ORDER — PINDOLOL 5 MG PO TABS: ORAL_TABLET | ORAL | Status: DC

## 2011-07-25 NOTE — Progress Notes (Signed)
Patient Name: Yvette [name] Date of Encounter: 07/25/2011  Primary Care Provider:  Majel Homer, MD, MD Primary Cardiologist:  J. Hochrein, MD  Patient Profile  78 -year-old female with diastolic CHF who presents for followup.  Problem List   Past Medical History  Diagnosis Date  . Paroxysmal atrial flutter [facility] admission 2/5-04/20/2010    High fall risk-not a Coumadin candidate  . Hypertension   . Chronic diastolic heart failure     Echo February 2012: EF 55%; moderate LVH; mild AI; mild MR; PASP 39  . Sick sinus syndrome     Limits use of beta blocker-pindolol tolerated  . NSTEMI (non-ST elevated myocardial infarction)     Troponin 0.12, 0.13, 0.08 during admission 04/2010  . Arthritis   . Anxiety   . Hydronephrosis     Chronic   Past Surgical History  Procedure Date  . Esophagogastroduodenoscopy 04/19/2011    Procedure: ESOPHAGOGASTRODUODENOSCOPY (EGD);  Surgeon: Yvette Blinks, MD;  Location: [facility] ENDOSCOPY;  Service: Gastroenterology;  Laterality: N/A;    Allergies  No Known Allergies  HPI  78 year old female with the above problem list.  Her daughter is here with her today and assists with translation.  Patient's daughter reports that last week patient had cold like symptoms with a nonproductive cough.  They treated this with Day-quil with improvement in symptoms.  Over that period of time however her blood pressure was elevated in the 190/70-90 range.  Patient also reported feeling tired and her daughter noted that she was a little more short of breath.  Her weight has been stable at 105 pounds on her home scale and she has chronic 3 pillow orthopnea.  She doesn't do much in the way of activity at home but is able to do her usual walk around the house without any significant dyspnea.  Because of elevations in blood pressure and concern of her dyspnea, and his appointment with me today.  Since Sunday however patient has been feeling somewhat better.  There has been no chest pain,  palpitations, PND, edema, dizziness, syncope, or early satiety.  Home Medications  Prior to Admission medications   Medication Sig Start Date End Date Taking? Authorizing Provider  acetaminophen (TYLENOL) 500 MG tablet Take 1 tablet (500 mg total) by mouth every 12 (twelve) hours as needed for pain. 05/01/11 04/30/12 Yes Yvette Lecher, PA  aspirin 81 MG chewable tablet Chew 81 mg by mouth daily.   Yes Historical Provider, MD  Lysine 500 MG CAPS Take 500 mg by mouth 2 (two) times daily.    Yes Historical Provider, MD  Melatonin 3 MG TABS Take 3 mg by mouth at bedtime.    Yes Historical Provider, MD  Omega-3 Fatty Acids (FISH OIL PO) Take 1 capsule by mouth daily.   Yes Historical Provider, MD  pantoprazole (PROTONIX) 40 MG tablet Take 1 tablet (40 mg total) by mouth daily. 05/01/11 04/30/12 Yes Yvette Lecher, PA  pindolol (VISKEN) 5 MG tablet TAKE 1 TABLET IN THE MORNING AND TAKE 1/2 TABLET IN THE EVENING 07/25/11  Yes Yvette Anis, NP  Pseudoephedrine-APAP-DM (DAYQUIL MULTI-SYMPTOM PO) Take 1 tablet by mouth as directed.   Yes Historical Provider, MD  torsemide (DEMADEX) 20 MG tablet Take 10 mg by mouth three times a week   Yes Historical Provider, MD   Review of Systems Dyspnea, cough, malaise, cold-like symptoms as outlined above. All other systems reviewed and are otherwise negative except as noted above.  Physical Exam  Blood pressure 142/78,  pulse 81, resp. rate 18, height 4\' 8"  (1.422 m), weight 105 lb 12.8 oz (47.991 kg).  General: Pleasant, NAD Psych: Normal affect. Neuro: Alert and oriented X 3. Moves all extremities spontaneously. HEENT: Normal  Neck: Supple without bruits or JVD. Lungs:  Resp regular and unlabored, basilar crackles. Heart: RRR no s3, s4, or murmurs. Abdomen: Soft, non-tender, non-distended, BS + x 4.  Extremities: No clubbing, cyanosis or edema. DP/PT/Radials 2+ and equal bilaterally.  Assessment & Plan  1.  Chronic diastolic congestive heart  failure:  Patient had cold-like symptoms last week which were treated with DayQuil.  Her daughter noted that she had significant hypertension during that time and this has improved some.  I recommended that they discontinue DayQuil and avoid in the future as this can contribute to hypertension.  Further, and hypertension will worsen her diastolic failure.  She does have a few crackles on exam but otherwise her volume status appears to be stable and her weight, by her home scale, has been stable.  She takes torsemide 10 mg every Tuesday, Thursday, and Saturday.  I recommended that today she take 20 mg instead of 10 and then resume her usual dose on Thursday.  To address her blood pressure issues, I recommended increasing her pindolol to 5 mg in the morning and half a tablet in the afternoon.  If she develops any dizziness,  orthostatic symptoms, or consistently has pressures less Yvette [name] 120 systolic, I recommended that they call in so we can change her dose.  2.  Hypertension:  As above, pressure has been elevated at home but in the setting of over-the-counter cold medication.  He'll discontinue this in the future look for over-the-counter remedies more suitable for hypertensive patients. I'll adjust her beta blocker as above.  3.  History of atrial flutter:  She has had no recent palpitations.  4.  Disposition: Followup with Dr. Antoine [name] as scheduled.  Family knows to call in for earlier appt if BP remains elevated, drops too low, or pt becomes dizzy/orthostatic in setting of bp med changes.   Yvette Ducking, NP 07/25/2011, 9:57 AM

## 2011-07-25 NOTE — Patient Instructions (Signed)
Your physician wants you to follow-up in: SEPT 2013 WITH DR. HOCHREIN. You will receive a reminder letter in the mail two months in advance. If you don't receive a letter, please call our office to schedule the follow-up appointment.  INCREASE PINDOLOL TO 5 MG TABLET ; TAKE 1 WHOLE TABLET IN THE MORNING AND TAKE 1/2 (HALF) TABLET IN THE EVENING  TODAY TAKE WHOLE TABLET OF DEMADEX THEN RESUME  REGULAR DOSE  CALL IF YOUR SYSTOLIC BLOOD PRESSURE IS 120 OR BELOW

## 2011-07-27 ENCOUNTER — Other Ambulatory Visit: Payer: Self-pay | Admitting: *Deleted

## 2011-08-09 ENCOUNTER — Telehealth: Payer: Self-pay | Admitting: Cardiology

## 2011-08-09 DIAGNOSIS — I5032 Chronic diastolic (congestive) heart failure: Secondary | ICD-10-CM

## 2011-08-09 MED ORDER — PINDOLOL 5 MG PO TABS: ORAL_TABLET | ORAL | Status: DC

## 2011-08-09 NOTE — Telephone Encounter (Signed)
New msg Pt's daughter wants to verify dosage of pindolol. She said she saw Ward Givens and he  increased to 15mg  and she doesn't have anymore pills. Please call to [facility]

## 2011-08-09 NOTE — Telephone Encounter (Signed)
Daughter calling because pt needs a refill of Pindolol since it was increase to 5 mg in the am and 2.5 mg in the pm. Will refill med as requested

## 2011-09-06 ENCOUNTER — Telehealth: Payer: Self-pay | Admitting: *Deleted

## 2011-09-06 DIAGNOSIS — I495 Sick sinus syndrome: Secondary | ICD-10-CM

## 2011-09-06 NOTE — Progress Notes (Signed)
Oops forgot to route message to you, pt coming in 7/3 for free t4 and tsh

## 2011-09-06 NOTE — Progress Notes (Signed)
S/w daughter China today and she states pt did not f/u on her labs for her thyroid. Daughter will bring pt in 09/13/11 for free t4 and tsh.

## 2011-09-06 NOTE — Telephone Encounter (Signed)
Message copied by Tarri Fuller on Wed Sep 06, 2011  3:22 PM ------      Message from: [place], [place] T      Created: Tue Sep 05, 2011 11:32 AM       Follow up TFTs never done from last year.      Please make sure patient has follow up on thyroid.      Can get labs with PCP.  She needs TSH and Free T4 done.      Tereso Newcomer, PA-C  11:34 AM 09/05/2011                    ----- Message -----         From: SYSTEM         Sent: 09/04/2011  12:02 AM           To: Beatrice Lecher, PA

## 2011-09-06 NOTE — Telephone Encounter (Signed)
daughter states pt has not had f/u labs on her thyroid as pt was supposed to. daughter will bring pt in 09/13/11 for labs

## 2011-09-13 ENCOUNTER — Other Ambulatory Visit (INDEPENDENT_AMBULATORY_CARE_PROVIDER_SITE_OTHER): Payer: Medicaid Other

## 2011-09-13 DIAGNOSIS — I495 Sick sinus syndrome: Secondary | ICD-10-CM

## 2011-09-13 DIAGNOSIS — I509 Heart failure, unspecified: Secondary | ICD-10-CM

## 2011-09-13 LAB — T4, FREE: Free T4: 0.84 ng/dL (ref 0.60–1.60)

## 2011-09-13 LAB — TSH: TSH: 0.15 u[IU]/mL — ABNORMAL LOW (ref 0.35–5.50)

## 2011-10-03 ENCOUNTER — Ambulatory Visit (INDEPENDENT_AMBULATORY_CARE_PROVIDER_SITE_OTHER): Payer: Medicaid Other | Admitting: Family Medicine

## 2011-10-03 ENCOUNTER — Encounter: Payer: Self-pay | Admitting: Family Medicine

## 2011-10-03 VITALS — BP 177/83 | HR 76 | Temp 98.9°F | Ht <= 58 in | Wt 106.5 lb

## 2011-10-03 DIAGNOSIS — R6889 Other general symptoms and signs: Secondary | ICD-10-CM

## 2011-10-03 DIAGNOSIS — R7989 Other specified abnormal findings of blood chemistry: Secondary | ICD-10-CM

## 2011-10-03 DIAGNOSIS — R5383 Other fatigue: Secondary | ICD-10-CM

## 2011-10-03 DIAGNOSIS — I10 Essential (primary) hypertension: Secondary | ICD-10-CM

## 2011-10-03 DIAGNOSIS — R5381 Other malaise: Secondary | ICD-10-CM

## 2011-10-03 LAB — CBC WITH DIFFERENTIAL/PLATELET
Eosinophils Absolute: 0.1 10*3/uL (ref 0.0–0.7)
Eosinophils Relative: 2 % (ref 0–5)
Lymphs Abs: 1.5 10*3/uL (ref 0.7–4.0)
MCH: 29.1 pg (ref 26.0–34.0)
MCV: 90.8 fL (ref 78.0–100.0)
Platelets: 261 10*3/uL (ref 150–400)
RBC: 4.68 MIL/uL (ref 3.87–5.11)

## 2011-10-03 LAB — POCT URINALYSIS DIPSTICK
Glucose, UA: NEGATIVE
Leukocytes, UA: NEGATIVE
Nitrite, UA: NEGATIVE
Urobilinogen, UA: 0.2

## 2011-10-03 LAB — POCT UA - MICROSCOPIC ONLY

## 2011-10-03 NOTE — Progress Notes (Signed)
Subjective:     Patient ID: Yvette [name], female   DOB: September 12, 1916, 78 y.o.   MRN: [id number]  HPI Yvette [name] is a 78 y/o woman with a history of diastolic heart failure and CKD who comes to clinic today for evaluation of an elevated TSH. She is seen with her daughter, who translates and provides the majority of the history.   Her daughter states that her TSH was checked by her cardiologist earlier this month, and it was reported to be low at that time. They were instructed to follow up with their PCP to assess whether this issue needed to be managed medically.   However, she also presents with 2 day history of increasing fatigue and shortness of breath at night.  Her daughter states that this weekend Yvette [name] started feeling more short of breath [name], especially when she was lying down. She has also noted that over the past 2 days that Yvette [name]'s blood pressure has been extremely high, with the systolic pressure in the 180s.  Her daughter felt like she "had a lot of water" so she gave her an extra torsemide pill to help remove some of the fluid.   Denies cough, dizziness, light headedness, nausea, vomiting, diarrhea, or any other changes to her urine of stool.   Review of Systems As per above    Objective:   Physical Exam General: Elderly appearing woman in no acute distress HEENT: Normocephalic, atraumatic, no conjunctival pallor, MMM, neck is supple and without  Adenopathy Pulm: Rough crackles are heard at both lung bases, no increased work of breathing CV: RRR, no murmurs, rubs, or gallops Abd: Soft, nontender Ext: 1+ pretibial edema bilaterally    Assessment/Plan  1. Shortness of breath: Most likely exacerbation of heart failure. Daughter increased dose of torsemide this morning, so will check labs today and see if any further action is warranted. Will also check a urinalysis to rule out UTI or occult causes of SOB/fatigue.  2. Thyroid function: Will check TSH and free T3/T4  today to determine whether this requires action.      Plan:     ** Progress noted opened in medical student's name.  However I completed HPI and Physical examination. Please see Problem list for my plans.

## 2011-10-03 NOTE — Patient Instructions (Addendum)
I will call you with the lab results tomorrow when they come back.  If she starts having any confusion, trouble eating, abdominal pain, or throwing up please come back or go to the emergency room.  If her breathing gets worse of course go as well.  I think you did the right thing with the increased Torsemide dose.

## 2011-10-04 ENCOUNTER — Telehealth: Payer: Self-pay | Admitting: Family Medicine

## 2011-10-04 DIAGNOSIS — R5383 Other fatigue: Secondary | ICD-10-CM

## 2011-10-04 DIAGNOSIS — R7989 Other specified abnormal findings of blood chemistry: Secondary | ICD-10-CM | POA: Insufficient documentation

## 2011-10-04 HISTORY — DX: Other fatigue: R53.83

## 2011-10-04 LAB — COMPREHENSIVE METABOLIC PANEL
ALT: 13 U/L (ref 0–35)
Alkaline Phosphatase: 70 U/L (ref 39–117)
Glucose, Bld: 108 mg/dL — ABNORMAL HIGH (ref 70–99)
Sodium: 140 mEq/L (ref 135–145)
Total Bilirubin: 0.4 mg/dL (ref 0.3–1.2)
Total Protein: 8.1 g/dL (ref 6.0–8.3)

## 2011-10-04 LAB — BRAIN NATRIURETIC PEPTIDE: Brain Natriuretic Peptide: 506.9 pg/mL — ABNORMAL HIGH (ref 0.0–100.0)

## 2011-10-04 NOTE — Telephone Encounter (Signed)
Called and discussed labs with patient's daughter, who is patient's caretaker.  Discussed that BNP is slightly elevated, which corresponds to somewhat increased crackles noted on exam.  Daughter had provided patient with extra Torsemide and she has been doing much better since that time, no further fatigue or trouble breathing.  Told patient I am glad to hear this.  Also discussed that she should have thyroid testing repeated.  Daughter will call tomorrow to set up lab appt.  Orders for TSH and free T4 are already placed.

## 2011-10-04 NOTE — Assessment & Plan Note (Signed)
Combined with shortness of breath, concerning for acute decompensation of heart failure. Note crackles on exam -- however these also noted at previous cardiology visit back in May.  Not noted in March.   +1 edema BL ankles.   Agree with increased Torsemide use yesterday by daughter.  Obtaining CMET, CBC, BNP today. Much less likely infectious cause as no fevers or chills, but this is reason for CBC with diff as she is elderly and may not mount a proper immune response.  BNP for any heart failure decompensation. Deconditioning also a possibility but less likely due to acute nature. On record review, patient also with history of anemia, which could contribute to fatigue. Elevated BP noted today, also possibly secondary to hyperthyroidism.   Will call patient with lab results.  FU later this week.

## 2011-10-04 NOTE — Assessment & Plan Note (Signed)
Patient with low TSH and normal free T4.   This favors hyperthyroidism versus subclinical hyperthyroidism.   Plan to repeat labwork to confirm. May be contributing to elevated blood pressure and fatigue.  Beta blocker would likely limit any tachycardia associated with this.  No signs or symptoms of thyroid storm.

## 2011-10-04 NOTE — Telephone Encounter (Signed)
Daughter called back to leave another phone number to call her discuss mom.  You can reach her at [phone] after 1:00 pm.

## 2011-10-04 NOTE — Telephone Encounter (Signed)
Tried office but people at work did not know who the family member was that I was attempting to call.  Called mobile number and had to leave message to call back

## 2011-10-05 ENCOUNTER — Ambulatory Visit (INDEPENDENT_AMBULATORY_CARE_PROVIDER_SITE_OTHER): Payer: Medicaid Other | Admitting: Nurse Practitioner

## 2011-10-05 ENCOUNTER — Encounter: Payer: Self-pay | Admitting: Nurse Practitioner

## 2011-10-05 ENCOUNTER — Telehealth: Payer: Self-pay | Admitting: Cardiology

## 2011-10-05 VITALS — BP 146/72 | HR 86 | Ht <= 58 in | Wt 107.0 lb

## 2011-10-05 DIAGNOSIS — I5033 Acute on chronic diastolic (congestive) heart failure: Secondary | ICD-10-CM

## 2011-10-05 DIAGNOSIS — R0602 Shortness of breath: Secondary | ICD-10-CM

## 2011-10-05 DIAGNOSIS — I509 Heart failure, unspecified: Secondary | ICD-10-CM

## 2011-10-05 DIAGNOSIS — I10 Essential (primary) hypertension: Secondary | ICD-10-CM | POA: Insufficient documentation

## 2011-10-05 HISTORY — DX: Shortness of breath: R06.02

## 2011-10-05 HISTORY — DX: Acute on chronic diastolic (congestive) heart failure: I50.33

## 2011-10-05 NOTE — Telephone Encounter (Signed)
I spoke with [place] and she said the pt took a whole Demadex 20mg  on Monday due to SOB.  The pt saw Dr Gwendolyn Grant on 10/03/11 and he felt like some SOB could be from CHF.  A BNP was drawn at that appointment and it was elevated.  Per [place] the pt continues to have SOB. I have scheduled the to come into the office today at 2:30 for evaluation with Ward Givens NP.

## 2011-10-05 NOTE — Telephone Encounter (Signed)
New msg Pt's daughter called and said she has been having some sob. Please call

## 2011-10-05 NOTE — Progress Notes (Signed)
Patient Name: Yvette [name] Date of Encounter: 10/05/2011  Primary Care Provider:  Majel Homer, MD Primary Cardiologist:  J. Hochrein, MD  Patient Profile  78 y/o female w/ h/o diast chf who presents 2/2 prog dyspnea.  Problem List   Past Medical History  Diagnosis Date  . Paroxysmal atrial flutter [facility] admission 2/5-04/20/2010    High fall risk-not a Coumadin candidate  . Hypertension   . Chronic diastolic heart failure     a. Echo February 2012: EF 55%; moderate LVH; mild AI; mild MR; PASP 39  . Sick sinus syndrome     a. Limits use of beta blocker-pindolol tolerated  . NSTEMI (non-ST elevated myocardial infarction)     a. Troponin 0.12, 0.13, 0.08 during admission 04/2010 - conservative mgmt.  . Arthritis   . Anxiety   . Hydronephrosis     Chronic   Past Surgical History  Procedure Date  . Esophagogastroduodenoscopy 04/19/2011    Procedure: ESOPHAGOGASTRODUODENOSCOPY (EGD);  Surgeon: Yvette Blinks, MD;  Location: [facility] ENDOSCOPY;  Service: Gastroenterology;  Laterality: N/A;    Allergies  No Known Allergies  HPI  78 y/o female with the above problem list.  I last saw her in clinic in May @ which time she had mild volume overload and was hypertensive.  @ that time I increased her diuretics for a few days and adjusted her bb dose and her dtr tells me that she had been doing fine since.  Since last Saturday however, pts dtr (who is primary historian as pt is very hard of hearing and does not speak [place]) states that pt has been complaining of increasing SOB and has had mild LEE.  She also has had some worsening of her already 3 pillow orthopnea.  Finally, her weight has increased to 106lbs on their home scale, which is above her baseline of 104.  Her BP was also running in the 170's.  On Monday, she gave her mother 75 rather Yvette [name] 10 of demadex and has been giving 10 daily since, instead of the usual 10mg  3x/wk.  Despite this, pts weight has remained mildly elevated and she cont to c/o  dyspnea.  Edema has improved.  She saw her PCP on the 23rd and labs were drawn.  BNP was mildly elevated, just above 500.  BMET was wnl.  They were contacted and advised to f/u today.  Home Medications  Prior to Admission medications   Medication Sig Start Date End Date Taking? Authorizing Provider  acetaminophen (TYLENOL) 500 MG tablet Take 1 tablet (500 mg total) by mouth every 12 (twelve) hours as needed for pain. 05/01/11 04/30/12 Yes Yvette Lecher, PA  aspirin 81 MG chewable tablet Chew 81 mg by mouth daily.   Yes Historical Provider, MD  Lysine 500 MG CAPS Take 500 mg by mouth 2 (two) times daily.    Yes Historical Provider, MD  Melatonin 3 MG TABS Take 3 mg by mouth at bedtime.    Yes Historical Provider, MD  Multiple Vitamin (MULTIVITAMIN) tablet Take 1 tablet by mouth daily.   Yes Historical Provider, MD  Omega-3 Fatty Acids (FISH OIL PO) Take 1 capsule by mouth daily.   Yes Historical Provider, MD  pantoprazole (PROTONIX) 40 MG tablet Take 1 tablet (40 mg total) by mouth daily. 05/01/11 04/30/12 Yes Yvette Lecher, PA  pindolol (VISKEN) 5 MG tablet TAKE 1 TABLET IN THE MORNING AND TAKE 1/2 TABLET IN THE EVENING 08/09/11  Yes Yvette Rotunda, MD  torsemide (DEMADEX) 20 MG  tablet Take 10 mg by mouth three times a week   Yes Historical Provider, MD    Review of Systems  DOE, LEE, and orthopnea as outlined above.  No chest pain.  All other systems reviewed and are otherwise negative except as noted above.  Physical Exam  Blood pressure 146/72, pulse 86, height 4\' 9"  (1.448 m), weight 107 lb (48.535 kg).  General: Pleasant, NAD Psych: Normal affect. Neuro: Alert and oriented X 3. Moves all extremities spontaneously. HEENT: Normal  Neck: Supple without bruits or JVD. Lungs:  Resp regular and unlabored with bibasilar crackles. Heart: RRR no s3, s4, or murmurs. Abdomen: Soft, non-tender, non-distended, BS + x 4.  Extremities: No clubbing, cyanosis or edema. DP/PT/Radials 2+ and equal  bilaterally.  Accessory Clinical Findings  ECG - rsr, 84, LVH, inf ST elevation (old), lateral TWI  (old).  Assessment & Plan  1.  Acute on chronic diastolic chf:  Pt with mild volume overload and symptomatic chf over the past few day.  I've advised that she double her dose of demadex over the next 3 day (20mg  daily x 3 days) then resume 10mg  q MWF.  We had success with this a few months ago.  Ultimately, she may end up requiring more Yvette [name] 3x/wk diuretic.  She just had labs on 7/23 and I will not repeat today.  We will see her back in 2 wks or sooner if necessary.  Her dtr is very diligent about her care.  2.  HTN:  Dtr reports that bp was up earlier in the week.  She is mildly elevated today.  Her CHF is very much driven by her HTN.  Rather Yvette [name] adjust her antihypertensives, I prefer to see how she does with diuresis first.  Yvette Ducking, NP 10/05/2011, 5:24 PM

## 2011-10-05 NOTE — Patient Instructions (Addendum)
Your physician has recommended you make the following change in your medication: Take an extra 10 mg today of your Torsemide, take 20 mg tomorrow (Friday), and 20 mg Saturday.  Then RESUME 10 mg MON,WED,FRI as usual.  Your physician recommends that you schedule a follow-up appointment in: 2 weeks with Ward Givens, NP

## 2011-10-26 ENCOUNTER — Ambulatory Visit: Payer: Medicaid Other | Admitting: Nurse Practitioner

## 2011-11-09 ENCOUNTER — Other Ambulatory Visit: Payer: Medicaid Other

## 2011-11-09 DIAGNOSIS — R7989 Other specified abnormal findings of blood chemistry: Secondary | ICD-10-CM

## 2011-11-09 LAB — T4, FREE: Free T4: 1.23 ng/dL (ref 0.80–1.80)

## 2011-11-09 LAB — TSH: TSH: 0.258 u[IU]/mL — ABNORMAL LOW (ref 0.350–4.500)

## 2011-11-09 NOTE — Progress Notes (Signed)
TSH AND F-T4 DONE TODAY Clay Menser 

## 2011-12-04 ENCOUNTER — Telehealth: Payer: Self-pay | Admitting: Cardiology

## 2011-12-04 NOTE — Telephone Encounter (Signed)
New problem:  Last 7 days C/O high blood pressure.  Range from 200-190. B/p today this am 193/86.  Yesterday  196/90.

## 2011-12-04 NOTE — Telephone Encounter (Signed)
Spoke to patient's daughter she stated mother's B/P has been elevated for 1 week.States she feels ok except she will get a headache when B/P is high.Patient was due for follow up visit with Dr.Hochrein this month.Dr.Hochrein's schedule is full.Appointment scheduled with Tereso Newcomer PA Wed 12/06/11 at 9:50 am.

## 2011-12-06 ENCOUNTER — Ambulatory Visit: Payer: Medicaid Other | Admitting: Physician Assistant

## 2011-12-06 ENCOUNTER — Encounter: Payer: Self-pay | Admitting: Physician Assistant

## 2011-12-06 ENCOUNTER — Ambulatory Visit (INDEPENDENT_AMBULATORY_CARE_PROVIDER_SITE_OTHER): Payer: Medicaid Other | Admitting: Physician Assistant

## 2011-12-06 VITALS — BP 112/72 | HR 79 | Ht <= 58 in | Wt 107.0 lb

## 2011-12-06 DIAGNOSIS — I4892 Unspecified atrial flutter: Secondary | ICD-10-CM

## 2011-12-06 DIAGNOSIS — I10 Essential (primary) hypertension: Secondary | ICD-10-CM

## 2011-12-06 DIAGNOSIS — I5032 Chronic diastolic (congestive) heart failure: Secondary | ICD-10-CM

## 2011-12-06 LAB — BASIC METABOLIC PANEL
CO2: 24 mEq/L (ref 19–32)
Calcium: 8.8 mg/dL (ref 8.4–10.5)
GFR: 53.07 mL/min — ABNORMAL LOW (ref >60.00)
Sodium: 139 mEq/L (ref 135–145)

## 2011-12-06 MED ORDER — PINDOLOL 5 MG PO TABS: ORAL_TABLET | ORAL | Status: DC

## 2011-12-06 NOTE — Patient Instructions (Addendum)
Your physician has recommended you make the following change in your medication: 1.)  CHANGE PINDOLOL TO ONE HALF TABLET (2.5 MG) THREE TIMES DAILY 6-8 HOURS APART   Your physician recommends that you HAVE lab work TODAY: BMET  Your physician recommends that you schedule a follow-up appointment in: 2-3 MONTHS WITH DR. HOCHREIN  Check blood pressures at home if blood pressure elevates higher then 140/90 please call our office at [phone]

## 2011-12-06 NOTE — Progress Notes (Signed)
[address] [place], [place]  [address] Phone: [phone] Fax:  [phone]  Date:  12/06/2011   Name:  Yvette [name]       DOB:  1916/07/13 MRN:  [id number]  PCP:  Dr. Majel Homer Primary Cardiologist:  Dr. Rollene Rotunda  Primary Electrophysiologist:  None    History of Present Illness: Yvette [name] is a 78 y.o. [place] female who returns for evaluation of blood pressure.    She has a h/o atypical atrial flutter, diastolic CHF, presumed CAD, s/p NSTEMI 2/12 treated conservatively, normal LVF by echo 2/12.  She has demonstrated signs of tachybradycardia syndrome with beta blocker therapy and has been switched to pindolol.  Admitted 04/2011 with chest pain and dyspnea suspicious for unstable angina.  ECG was also concerning for inferior ST elevation.  Conservative management was pursued by her family and she was not taken to the cath lab.  Her cardiac markers remained negative.  She is not felt to be a coumadin candidate.  EGD 2/13 demonstrated mild esophagitis, small hiatal hernia.  Last seen in this office 10/05/11 with Yvette Shipper, NP.  She was volume overloaded and her Torsemide was adjusted.    Over the last 1-2 weeks, she has noted elevated blood pressures in the evenings. This typically occurs after 3 PM. Pressures range 169/82-202/90. Morning blood pressures are typically optimal. She does have headaches and feels tired when her blood pressure goes up. She denies any chest pain, shortness of breath. She sleeps on 2 pillows chronically. LE edema is stable. She denies syncope. Weights are stable.  Labs (2/13):  K 4.2, creatinine 1.2, LDL 79 Labs (7/13):  K 4.2, creatinine 1.08, ALT 13, BNP 507, Hgb 13.6 Labs (8/13):  TSH 0.258, FT4 1.23   Wt Readings from Last 3 Encounters:  12/06/11 107 lb (48.535 kg)  10/05/11 107 lb (48.535 kg)  10/03/11 106 lb 8 oz (48.308 kg)     Past Medical History  Diagnosis Date  . Paroxysmal atrial flutter [facility] admission  2/5-04/20/2010    High fall risk-not a Coumadin candidate  . Hypertension   . Chronic diastolic heart failure     a. Echo February 2012: EF 55%; moderate LVH; mild AI; mild MR; PASP 39  . Sick sinus syndrome     a. Limits use of beta blocker-pindolol tolerated  . NSTEMI (non-ST elevated myocardial infarction)     a. Troponin 0.12, 0.13, 0.08 during admission 04/2010 - conservative mgmt.  . Arthritis   . Anxiety   . Hydronephrosis     Chronic    Current Outpatient Prescriptions  Medication Sig Dispense Refill  . acetaminophen (TYLENOL) 500 MG tablet Take 1 tablet (500 mg total) by mouth every 12 (twelve) hours as needed for pain.  100 tablet  2  . aspirin 81 MG chewable tablet Chew 81 mg by mouth daily.      Marland Kitchen Lysine 500 MG CAPS Take 500 mg by mouth 2 (two) times daily.       . Melatonin 3 MG TABS Take 3 mg by mouth at bedtime.       . Multiple Vitamin (MULTIVITAMIN) tablet Take 1 tablet by mouth daily.      . Omega-3 Fatty Acids (FISH OIL PO) Take 1 capsule by mouth daily.      . pantoprazole (PROTONIX) 40 MG tablet Take 1 tablet (40 mg total) by mouth daily.  30 tablet  6  . pindolol (VISKEN) 5 MG tablet TAKE 1 TABLET  IN THE MORNING AND TAKE 1/2 TABLET IN THE EVENING  45 tablet  11  . torsemide (DEMADEX) 20 MG tablet Take 10 mg by mouth three times a week        Allergies: No Known Allergies  History  Substance Use Topics  . Smoking status: Never Smoker   . Smokeless tobacco: Not on file  . Alcohol Use: No     ROS:  Please see the history of present illness.  No fevers, chills, cough.  All other systems reviewed and negative.   PHYSICAL EXAM: VS:  BP 112/72  Pulse 79  Ht 4\' 9"  (1.448 m)  Wt 107 lb (48.535 kg)  BMI 23.15 kg/m2 Well nourished, well developed, in no acute distress HEENT: normal Neck: no JVD at 90 degrees Cardiac:  normal S1, S2; RRR; no murmur Lungs:  clear to auscultation bilaterally, no wheezing, rhonchi or rales Abd: soft, nontender, no abdominal  bruits  Ext: no edema Skin: warm and dry Neuro:  CNs 2-12 intact, no focal abnormalities noted  EKG:  NSR, HR 79, normal axis, diffuse ST-T wave changes, no change from prior tracing,QTc 495 ms  ASSESSMENT AND PLAN:   1. Hypertension: Blood pressures at home tend increase in the evenings. I will adjust her pindolol to 2.5 mg 3 times a day to see if this helps. Her daughter will continue to monitor her blood pressures. If her pressure continues to be elevated in the afternoon/evenings, we could increase her evening dose to 5 mg. Check a basic metabolic panel today.  2. Chronic Diastolic CHF: Volume stable. Continue current diuretic regimen. Check a basic metabolic panel today. Followup with Dr. Antoine Poche in 2-3 months or sooner as needed.  3. Atrial Flutter: Maintaining sinus rhythm.  [name], PA-C  9:54 AM 12/06/2011

## 2011-12-07 ENCOUNTER — Telehealth: Payer: Self-pay | Admitting: *Deleted

## 2011-12-07 NOTE — Telephone Encounter (Signed)
daughter notified about lab results for pt w/verbal understanding today

## 2011-12-07 NOTE — Telephone Encounter (Signed)
Message copied by Tarri Fuller on Thu Dec 07, 2011 10:14 AM ------      Message from: [place], [place] T      Created: Thu Dec 07, 2011  8:24 AM       Please notify patient that the lab results are ok.      Tereso Newcomer, PA-C  8:24 AM 12/07/2011

## 2011-12-22 ENCOUNTER — Other Ambulatory Visit: Payer: Self-pay | Admitting: Physician Assistant

## 2011-12-22 ENCOUNTER — Other Ambulatory Visit: Payer: Self-pay | Admitting: *Deleted

## 2011-12-22 MED ORDER — PANTOPRAZOLE SODIUM 40 MG PO TBEC: 40.0000 mg | DELAYED_RELEASE_TABLET | Freq: Every day | ORAL | Status: DC

## 2011-12-22 NOTE — Telephone Encounter (Signed)
Refilled Pantoprazole 

## 2011-12-26 ENCOUNTER — Telehealth: Payer: Self-pay | Admitting: Cardiology

## 2011-12-26 MED ORDER — HYDRALAZINE HCL 10 MG PO TABS: 10.0000 mg | ORAL_TABLET | Freq: Two times a day (BID) | ORAL | Status: DC

## 2011-12-26 NOTE — Telephone Encounter (Signed)
Pt saw Yvette [name] on 9/25 - her pindolol was changed to 2.5 mg every 6 to 8 hours.  Per daughter report BP has been elevated and continues to remain 170-200 systolic and 82-90 diastolic.  Her HR has been 73 to 85.  Hong aware I will review with Dr Antoine Poche and call her back with any new orders

## 2011-12-26 NOTE — Telephone Encounter (Signed)
Hong aware   Rx sent into pharmacy.

## 2011-12-26 NOTE — Telephone Encounter (Signed)
Pt is still having b/p issues they have been checking it all week and it has been over 170 and 200

## 2011-12-26 NOTE — Telephone Encounter (Signed)
We can try 10 mg of hydralazine bid to see if this helps.  PO.  Disp number 30 with 11 refills.

## 2012-02-21 ENCOUNTER — Ambulatory Visit (INDEPENDENT_AMBULATORY_CARE_PROVIDER_SITE_OTHER): Payer: Medicaid Other | Admitting: Cardiology

## 2012-02-21 ENCOUNTER — Encounter: Payer: Self-pay | Admitting: Cardiology

## 2012-02-21 VITALS — BP 140/60 | HR 75 | Ht <= 58 in | Wt 107.8 lb

## 2012-02-21 DIAGNOSIS — I5032 Chronic diastolic (congestive) heart failure: Secondary | ICD-10-CM

## 2012-02-21 DIAGNOSIS — I4892 Unspecified atrial flutter: Secondary | ICD-10-CM

## 2012-02-21 NOTE — Progress Notes (Signed)
HPI The patient is doing well since I last saw her. She's having no further episodes of shortness of breath or none of the nausea or presyncope but she's had. She's not had any palpitations. She does still occasionally get some headaches and her blood pressure is elevated when that happens. At the last visit her pindolol was increased. Over the phone we added hydralazine. For the most part her blood pressure is controlled but occasionally she'll have systolics in the 170 range.   No Known Allergies  Current Outpatient Prescriptions  Medication Sig Dispense Refill  . acetaminophen (TYLENOL) 500 MG tablet Take 1 tablet (500 mg total) by mouth every 12 (twelve) hours as needed for pain.  100 tablet  2  . aspirin 81 MG chewable tablet Chew 81 mg by mouth daily.      . hydrALAZINE (APRESOLINE) 10 MG tablet Take 1 tablet (10 mg total) by mouth 2 (two) times daily.  60 tablet  11  . Lysine 500 MG CAPS Take 500 mg by mouth daily.       . Melatonin 5 MG TABS Take 5 mg by mouth daily.      . Multiple Vitamin (MULTIVITAMIN) tablet Take 1 tablet by mouth daily.      . Omega-3 Fatty Acids (FISH OIL PO) Take 1 capsule by mouth daily.      . pantoprazole (PROTONIX) 40 MG tablet Take 1 tablet (40 mg total) by mouth daily.  30 tablet  6  . pindolol (VISKEN) 5 MG tablet Take one half tablet (2.5 mg) three times a day 6-8 hours apart  45 tablet  11  . torsemide (DEMADEX) 20 MG tablet Take 10 mg by mouth three times a week        Past Medical History  Diagnosis Date  . Paroxysmal atrial flutter [facility] admission 2/5-04/20/2010    High fall risk-not a Coumadin candidate  . Hypertension   . Chronic diastolic heart failure     a. Echo February 2012: EF 55%; moderate LVH; mild AI; mild MR; PASP 39  . Sick sinus syndrome     a. Limits use of beta blocker-pindolol tolerated  . NSTEMI (non-ST elevated myocardial infarction)     a. Troponin 0.12, 0.13, 0.08 during admission 04/2010 - conservative mgmt.  . Arthritis     . Anxiety   . Hydronephrosis     Chronic    Past Surgical History  Procedure Date  . Esophagogastroduodenoscopy 04/19/2011    Procedure: ESOPHAGOGASTRODUODENOSCOPY (EGD);  Surgeon: Erick Blinks, MD;  Location: [facility] ENDOSCOPY;  Service: Gastroenterology;  Laterality: N/A;    ROS: As stated in the HPI and negative for all other systems. (Per her daughter.)  PHYSICAL EXAM BP 140/60  Pulse 75  Ht 4\' 10"  (1.473 m)  Wt 107 lb 12.8 oz (48.898 kg)  BMI 22.53 kg/m2 PHYSICAL EXAM GEN:  No distress NECK:  No jugular venous distention at 90 degrees, waveform within normal limits, carotid upstroke brisk and symmetric, no bruits, no thyromegalyI LUNGS:  Clear to auscultation bilaterally BACK:  No CVA tenderness CHEST:  Unremarkable HEART:  S1 and S2 within normal limits, no S3, no S4, no clicks, no rubs, no murmurs ABD:  Positive bowel sounds normal in frequency in pitch, no bruits, no rebound, no guarding, unable to assess midline mass or bruit with the patient seated. EXT:  2 plus pulses throughout, trace edema, no cyanosis no clubbing    ASSESSMENT AND PLAN  1. Hypertension: she has occasional fluctuations  in her blood pressure. We discussed when necessary dosing of her hydralazine. She will otherwise continue the medications as listed.  2. Chronic Diastolic CHF: She seems to be euvolemic. She is avoiding salt. We will continue the meds as listed.  3. Atrial Flutter: She is maintaining sinus rhythm.

## 2012-02-21 NOTE — Patient Instructions (Addendum)
The current medical regimen is effective;  continue present plan and medications.  Follow up in 6 months with Dr Hochrein.  You will receive a letter in the mail 2 months before you are due.  Please call us when you receive this letter to schedule your follow up appointment.  

## 2012-03-01 ENCOUNTER — Encounter: Payer: Self-pay | Admitting: Family Medicine

## 2012-03-01 ENCOUNTER — Ambulatory Visit (INDEPENDENT_AMBULATORY_CARE_PROVIDER_SITE_OTHER): Payer: Medicaid Other | Admitting: Family Medicine

## 2012-03-01 VITALS — BP 142/76 | Temp 98.0°F

## 2012-03-01 DIAGNOSIS — R7989 Other specified abnormal findings of blood chemistry: Secondary | ICD-10-CM

## 2012-03-01 DIAGNOSIS — B029 Zoster without complications: Secondary | ICD-10-CM

## 2012-03-01 DIAGNOSIS — R6889 Other general symptoms and signs: Secondary | ICD-10-CM

## 2012-03-01 HISTORY — DX: Zoster without complications: B02.9

## 2012-03-01 LAB — T4, FREE: Free T4: 1.26 ng/dL (ref 0.80–1.80)

## 2012-03-01 LAB — TSH: TSH: 0.255 u[IU]/mL — ABNORMAL LOW (ref 0.350–4.500)

## 2012-03-01 MED ORDER — HYDROCODONE-ACETAMINOPHEN 5-500 MG PO TABS: 1.0000 | ORAL_TABLET | Freq: Three times a day (TID) | ORAL | Status: DC | PRN

## 2012-03-01 MED ORDER — VALACYCLOVIR HCL 1 G PO TABS: 1000.0000 mg | ORAL_TABLET | Freq: Two times a day (BID) | ORAL | Status: DC

## 2012-03-01 NOTE — Patient Instructions (Addendum)
You have shingles - the medical name is herpes zoster.  Please google that for more information. I am giving two medicines Valacyclovir which is an antiviral drug - like an antibiotic - to kill off the shingles virus Hydrocodone is a narcotic pain medicine because the pain of shingles can be severe.  Watch for two things.  Narcotics can make you sleepy.  Make sure she doesn't fall.  Narcotics can make you constipated.  You may need a stool softener or a laxative. Please do good hand washing.  This is contagious to people who have never had chicken pox or the chicken pox vaccine.  I also checked her thyroid again.  I will call with results.

## 2012-03-01 NOTE — Progress Notes (Signed)
  Subjective:    Patient ID: Yvette [name], female    DOB: 1916/09/11, 78 y.o.   MRN: [id number]  HPI Painful rash on Left thorax for 3 days.  No fever.  No contact.    Review of Systems     Objective:   Physical Exam  Dermatomal rash consistant with the excoriated lesions of shingles. Lungs. clear        Assessment & Plan:

## 2012-03-01 NOTE — Assessment & Plan Note (Signed)
Explained.  Rx with analgesics and valtrex

## 2012-03-04 ENCOUNTER — Encounter: Payer: Self-pay | Admitting: Family Medicine

## 2012-03-14 ENCOUNTER — Other Ambulatory Visit: Payer: Self-pay | Admitting: Cardiology

## 2012-04-30 IMAGING — CR DG CHEST 1V PORT
1 series · 1 of 1 positions shown · non-contrast
Comparison: 08/26/2010 radiograph.

CLINICAL DATA: Short of breath.  Abnormal EKG.  Acute myocardial
infarction.

PORTABLE CHEST - 1 VIEW

[AP]
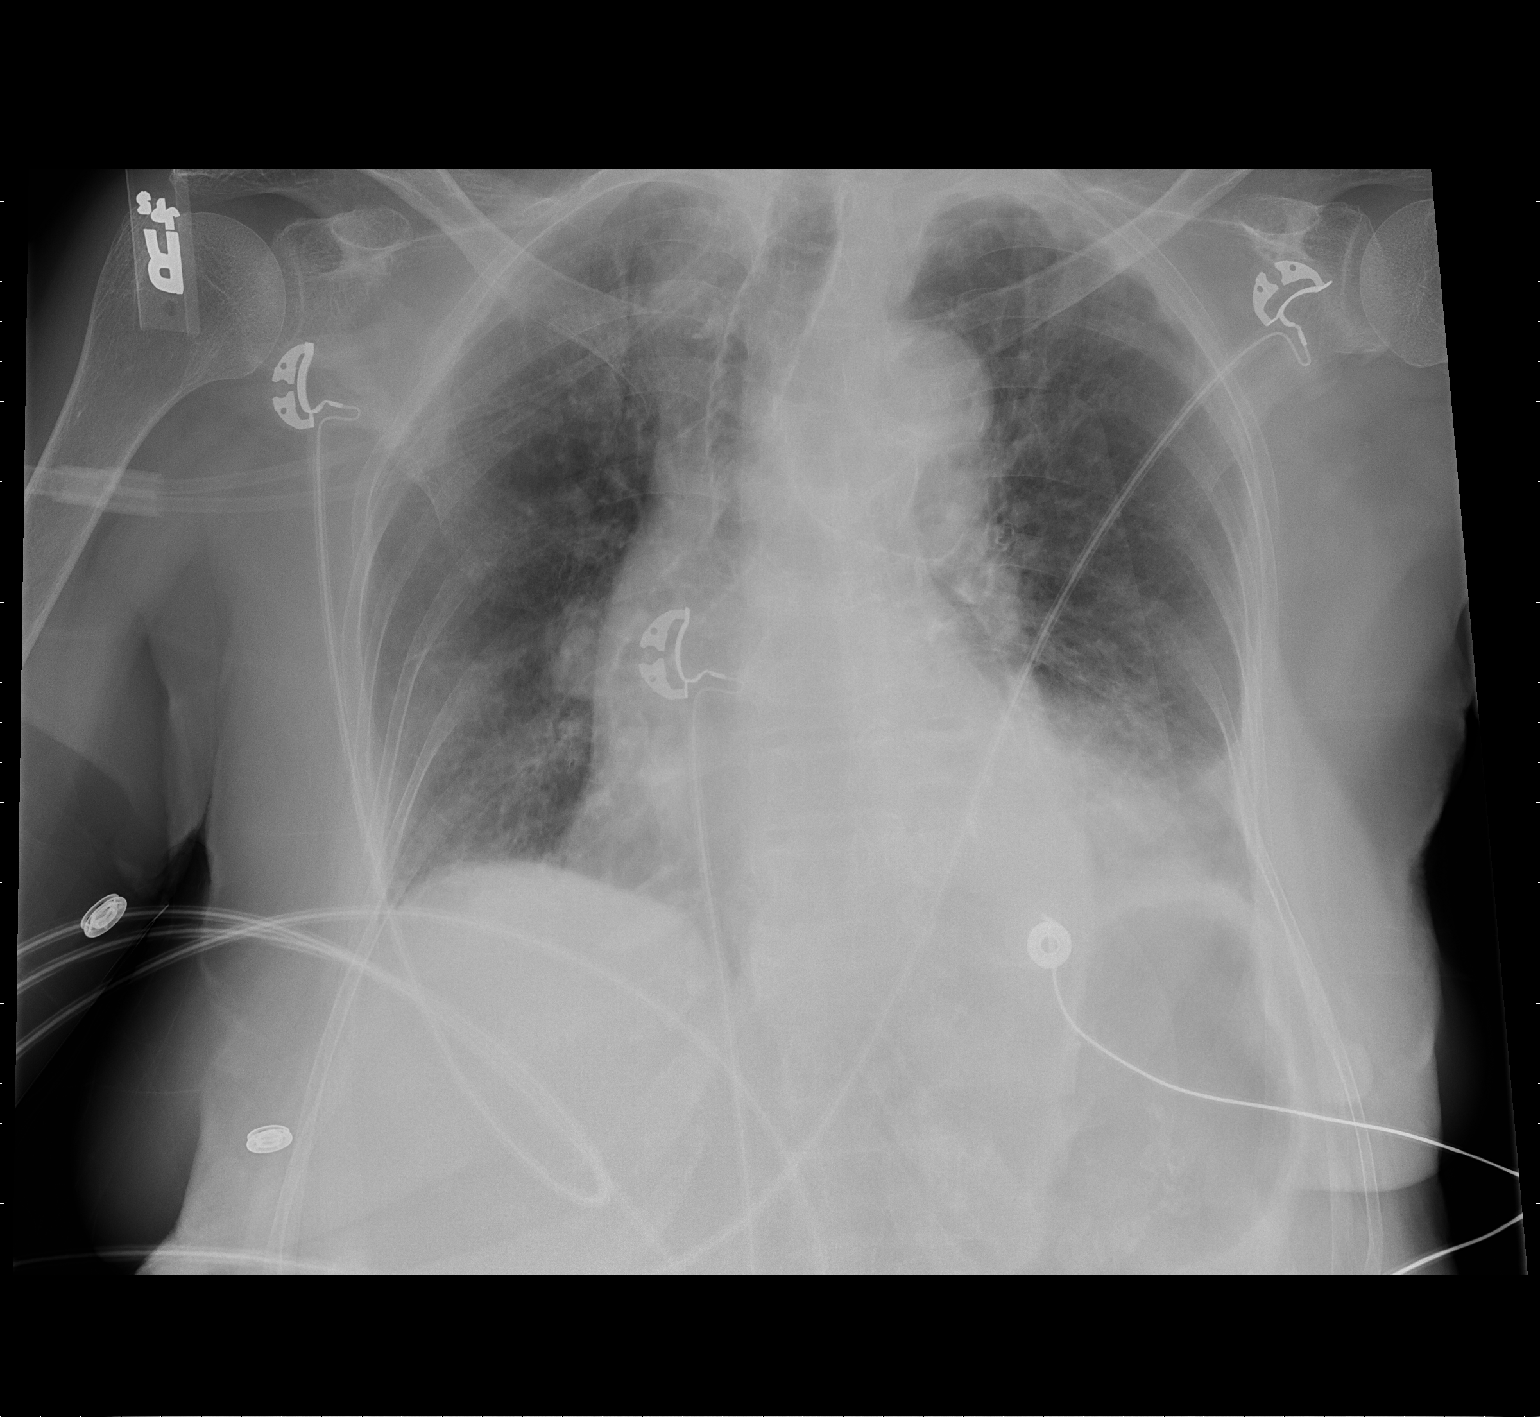

[1 of 1 positions shown; findings below may reference images not displayed]

FINDINGS: There is a left greater than right basilar airspace
disease.  Differential considerations include asymmetric pulmonary
edema, aspiration pneumonitis or less likely pneumonia.  No
definite pleural effusion is identified.  Cardiomegaly.
Atherosclerosis.  Emphysema.  Calcification of the tracheobronchial
tree. Monitoring leads are projected over the chest.
IMPRESSION: Cardiomegaly.  Asymmetric left greater than right basilar airspace
disease and atelectasis.  Favor asymmetric pulmonary edema over
aspiration or pneumonia.  Underlying emphysema.

## 2012-07-02 ENCOUNTER — Encounter: Payer: Self-pay | Admitting: Family Medicine

## 2012-07-02 ENCOUNTER — Ambulatory Visit (INDEPENDENT_AMBULATORY_CARE_PROVIDER_SITE_OTHER): Payer: Medicaid Other | Admitting: Family Medicine

## 2012-07-02 VITALS — BP 183/77 | HR 78 | Temp 99.0°F | Resp 18 | Wt 110.0 lb

## 2012-07-02 DIAGNOSIS — R300 Dysuria: Secondary | ICD-10-CM

## 2012-07-02 DIAGNOSIS — N390 Urinary tract infection, site not specified: Secondary | ICD-10-CM

## 2012-07-02 LAB — POCT URINALYSIS DIPSTICK
Nitrite, UA: POSITIVE
Protein, UA: 100
Spec Grav, UA: 1.015
Urobilinogen, UA: 0.2

## 2012-07-02 LAB — POCT UA - MICROSCOPIC ONLY

## 2012-07-02 MED ORDER — CEPHALEXIN 500 MG PO CAPS: 500.0000 mg | ORAL_CAPSULE | Freq: Three times a day (TID) | ORAL | Status: DC

## 2012-07-02 NOTE — Patient Instructions (Signed)
Take your antibiotics 3 times per day for the next 7 days. Come back to see me once you finish them.  That way we can check if you still have blood in your urine.

## 2012-07-02 NOTE — Progress Notes (Signed)
Patient ID: Yvette [name], female   DOB: 1916-12-06, 79 y.o.   MRN: [id number] Subjective: The patient is a 79 y.o. year old female who presents today for urinary tract infection.  1. Urinary tract infection: The patient has been having problems with dysuria and hematuria for the last 4 days. She has also been having some problems with sleeping possibly secondary to urinary urgency. She denies any back pain, abdominal pain, fevers, chills, nausea, or vomiting. The family had some amoxicillin left over and gave it to her for 2 days. Last dose was yesterday.  Patient's past medical, social, and family history were reviewed and updated as appropriate. History  Substance Use Topics  . Smoking status: Never Smoker   . Smokeless tobacco: Not on file  . Alcohol Use: No   Objective:  Filed Vitals:   07/02/12 1634  BP: 183/77  Pulse: 78  Temp: 99 F (37.2 C)  Resp: 18   Gen: No acute distress CV: Regular rate and rhythm Back: No CVA  Abdomen: Soft, nontender, nondistended. Minimal suprapubic tenderness  Assessment/Plan: Urinary tract infection. Treat with Keflex but will send for culture. Patient to return in 7 days for repeat urine. At that time she still has significant hematuria I would plan on performing a pelvic exam.  Please also see individual problems in problem list for problem-specific plans.

## 2012-07-03 LAB — URINE CULTURE: Colony Count: 15000

## 2012-07-08 ENCOUNTER — Telehealth: Payer: Self-pay | Admitting: Cardiology

## 2012-07-08 NOTE — Telephone Encounter (Signed)
New problem   Pt BP running from 190-180/178-184 is high and feeling very tired and can't lay down. Please call pt.

## 2012-07-08 NOTE — Telephone Encounter (Signed)
Left message to call back to discuss BP issues

## 2012-07-08 NOTE — Telephone Encounter (Signed)
Spoke with daughter who is aware Dr Antoine Poche is not in the office this afternoon.  She reports pt's BP is remaining elevated and has been for about 1 week.  180-193/78-82.  Pt c/o increased fatigue and not being able to lay down.  Per daughter's report - pt is weighed daily and has had no change in wt, she denies any swelling or edema and has no SOB.  Only fatigue.  Pt is taking medications as RXed per pt's report.  Recently treated for UTI that seems to have cleared according to daughter.  Being that Dr Antoine Poche is not in the office I requested the daughter call pt's PCP today for new orders/recommendation.  She states understanding and will call Dr Louanne Belton.  She will call back if necessary.

## 2012-07-11 ENCOUNTER — Encounter: Payer: Self-pay | Admitting: Family Medicine

## 2012-07-11 ENCOUNTER — Ambulatory Visit (INDEPENDENT_AMBULATORY_CARE_PROVIDER_SITE_OTHER): Payer: Medicaid Other | Admitting: Family Medicine

## 2012-07-11 VITALS — BP 188/69 | HR 78 | Temp 99.0°F | Ht <= 58 in | Wt 109.8 lb

## 2012-07-11 DIAGNOSIS — R0602 Shortness of breath: Secondary | ICD-10-CM

## 2012-07-11 DIAGNOSIS — I10 Essential (primary) hypertension: Secondary | ICD-10-CM

## 2012-07-11 NOTE — Patient Instructions (Signed)
It was good to see you today! I want you to take 1 pill of your torsemide on Saturday and 1/2 pill on Sunday. Please come in for some blood work on Monday. If your blood pressure isn't coming down by Tuesday, we will plan on increasing your hydralazine to three times per day.

## 2012-07-12 NOTE — Assessment & Plan Note (Signed)
If the patient's hypertension does not improve with a slightly increased dose of torsemide our plan on increasing her hydralazine to 3 times daily.

## 2012-07-12 NOTE — Assessment & Plan Note (Signed)
May very well be secondary to fluid retention. It is also possible that this is either idiopathic or of primary pulmonary origin. Colectomy other symptoms I would lean toward treatment of CHF. We'll give her slightly more torsemide the weekend and recheck labs first thing this coming week. If this does not improve her shortness of breath I would plan on performing a chest x-ray to better delineate pulmonary causes.

## 2012-07-12 NOTE — Progress Notes (Signed)
Patient ID: Yvette [name], female   DOB: [birth year], 79 y.o.   MRN: [id number] Subjective: The patient is a 79 y.o. year old female who presents today for followup.  1. Dysuria: This is resolved. Patient is not having any fevers or chills. She is not having any urinary frequency and is not complaining of any abdominal pain. Urine culture reviewed. Multiple morphotypes identified. This culture was collected after 2 days on amoxicillin.  2. Shortness of breath: Patient complaining of some problems with feeling short of breath. This seems to be bothering her mainly at night. It is made worse by lying down. Her cardiologist recently, as in within the last several months, decreased her torsemide dose to 2 hyponatremia. She is currently taking 20 mg of torsemide 3 days a week. The family is not noticed significant lower extremity swelling.  3. Hypertension: Patient's family has been noticing that she is more hypertensive recently. She denies any true chest pain. Her medications, except for her torsemide as previously noted, have not changed.  Patient's past medical, social, and family history were reviewed and updated as appropriate. History  Substance Use Topics  . Smoking status: Never Smoker   . Smokeless tobacco: Not on file  . Alcohol Use: No   Objective:  Filed Vitals:   07/11/12 1621  BP: 188/69  Pulse: 78  Temp: 99 F (37.2 C)   Gen: No acute distress CV: Regular rate and rhythm Resp: Crackles bilaterally at the bases, no wheezes, or rhonchi Ext: 1+ pitting edema to the midcalf bilaterally  Assessment/Plan: Urinary tract infection appears resolved.  Please also see individual problems in problem list for problem-specific plans.

## 2012-07-15 ENCOUNTER — Other Ambulatory Visit: Payer: Medicaid Other

## 2012-07-15 ENCOUNTER — Telehealth: Payer: Self-pay | Admitting: Family Medicine

## 2012-07-15 DIAGNOSIS — R0602 Shortness of breath: Secondary | ICD-10-CM

## 2012-07-15 LAB — BASIC METABOLIC PANEL
BUN: 30 mg/dL — ABNORMAL HIGH (ref 6–23)
CO2: 23 mEq/L (ref 19–32)
Chloride: 109 mEq/L (ref 96–112)
Creat: 1.17 mg/dL — ABNORMAL HIGH (ref 0.50–1.10)
Glucose, Bld: 126 mg/dL — ABNORMAL HIGH (ref 70–99)
Potassium: 4.2 mEq/L (ref 3.5–5.3)

## 2012-07-15 NOTE — Telephone Encounter (Signed)
Will forward to Dr Ritch 

## 2012-07-15 NOTE — Progress Notes (Signed)
BMP DONE TODAY Marye Eagen 

## 2012-07-15 NOTE — Telephone Encounter (Signed)
Daughter wanted you to know that her mom is still very short of breath and has to sit up all night to sleep.  She is really not getting much sleep at all.

## 2012-07-16 ENCOUNTER — Telehealth: Payer: Self-pay | Admitting: *Deleted

## 2012-07-16 NOTE — Telephone Encounter (Signed)
After speaking with dr Louanne Belton patients daughter was called back and instructed to have mother continue taking Torsemide 1/2  tablet 3 times a week and to take mother for a chest x-ray.she seemed to understand at this time. Ab Leaming, Virgel Bouquet

## 2012-07-16 NOTE — Telephone Encounter (Signed)
Please let the patient's daughter know that I would like the patient to go back to taking the torsemide 3 times per week and that I would like her to get a chest x-ray.  If possible, I would like to see her again later this week to re-examine her.  (It looks like I have open appointments on Thursday afternoon and am cross cover/SDA on Friday)

## 2012-07-16 NOTE — Telephone Encounter (Signed)
Related message,patient daughter refused to follow instruction was very rude and states she'll see Dr Louanne Belton on the 8th and discuss it Yvette [name]. Yvette [name], Yvette [name]

## 2012-07-18 ENCOUNTER — Ambulatory Visit (INDEPENDENT_AMBULATORY_CARE_PROVIDER_SITE_OTHER): Payer: Medicaid Other | Admitting: Family Medicine

## 2012-07-18 ENCOUNTER — Ambulatory Visit (HOSPITAL_COMMUNITY)
Admission: RE | Admit: 2012-07-18 | Discharge: 2012-07-18 | Disposition: A | Discharge status: Home / Self Care | Payer: Medicaid Other | Source: Ambulatory Visit | Attending: Family Medicine | Admitting: Family Medicine

## 2012-07-18 ENCOUNTER — Other Ambulatory Visit: Payer: Self-pay | Admitting: Physician Assistant

## 2012-07-18 ENCOUNTER — Encounter: Payer: Self-pay | Admitting: Family Medicine

## 2012-07-18 VITALS — BP 162/69 | HR 71 | Temp 99.2°F | Ht <= 58 in | Wt 110.0 lb

## 2012-07-18 DIAGNOSIS — I517 Cardiomegaly: Secondary | ICD-10-CM | POA: Insufficient documentation

## 2012-07-18 DIAGNOSIS — M546 Pain in thoracic spine: Secondary | ICD-10-CM

## 2012-07-18 DIAGNOSIS — R0602 Shortness of breath: Secondary | ICD-10-CM

## 2012-07-18 DIAGNOSIS — R079 Chest pain, unspecified: Secondary | ICD-10-CM | POA: Insufficient documentation

## 2012-07-18 MED ORDER — TRAMADOL HCL 50 MG PO TABS: 25.0000 mg | ORAL_TABLET | Freq: Three times a day (TID) | ORAL | Status: DC | PRN

## 2012-07-18 NOTE — Patient Instructions (Signed)
Go get your chest x-ray.  This will show [place] if you have additional fluid on your lungs or a fracture of one of your back bones. I have sent in a medication called Tramadol that you can take up to 3 times per day for your pain in addition to your Tylenol.  Take only 1/2 tablet at a time.

## 2012-07-18 NOTE — Assessment & Plan Note (Signed)
This appears to have resolved. Patient took a slightly higher dose of torsemide over the weekend and this appears to have helped with her symptoms. Was to obtain a chest x-ray to determine if there is any additional fluid overload present. Given the mild elevation of her creatinine related to the torsemide over the weekend I do not think he would be safe to diurese her any further at this time.

## 2012-07-21 ENCOUNTER — Emergency Department (HOSPITAL_COMMUNITY): Payer: Medicaid Other

## 2012-07-21 ENCOUNTER — Inpatient Hospital Stay (HOSPITAL_COMMUNITY)
Admission: EM | Admit: 2012-07-21 | Discharge: 2012-07-25 | DRG: 303 | Disposition: A | Discharge status: Home / Self Care | Payer: Medicaid Other | Attending: Family Medicine | Admitting: Family Medicine

## 2012-07-21 ENCOUNTER — Encounter (HOSPITAL_COMMUNITY): Payer: Self-pay | Admitting: Cardiovascular Disease

## 2012-07-21 DIAGNOSIS — R0602 Shortness of breath: Secondary | ICD-10-CM

## 2012-07-21 DIAGNOSIS — N183 Chronic kidney disease, stage 3 unspecified: Secondary | ICD-10-CM | POA: Diagnosis present

## 2012-07-21 DIAGNOSIS — N1330 Unspecified hydronephrosis: Secondary | ICD-10-CM | POA: Diagnosis present

## 2012-07-21 DIAGNOSIS — N135 Crossing vessel and stricture of ureter without hydronephrosis: Secondary | ICD-10-CM | POA: Diagnosis present

## 2012-07-21 DIAGNOSIS — I495 Sick sinus syndrome: Secondary | ICD-10-CM

## 2012-07-21 DIAGNOSIS — I4892 Unspecified atrial flutter: Secondary | ICD-10-CM

## 2012-07-21 DIAGNOSIS — R339 Retention of urine, unspecified: Secondary | ICD-10-CM

## 2012-07-21 DIAGNOSIS — I129 Hypertensive chronic kidney disease with stage 1 through stage 4 chronic kidney disease, or unspecified chronic kidney disease: Secondary | ICD-10-CM | POA: Diagnosis present

## 2012-07-21 DIAGNOSIS — F411 Generalized anxiety disorder: Secondary | ICD-10-CM | POA: Diagnosis present

## 2012-07-21 DIAGNOSIS — M546 Pain in thoracic spine: Secondary | ICD-10-CM

## 2012-07-21 DIAGNOSIS — I10 Essential (primary) hypertension: Secondary | ICD-10-CM

## 2012-07-21 DIAGNOSIS — I5033 Acute on chronic diastolic (congestive) heart failure: Secondary | ICD-10-CM

## 2012-07-21 DIAGNOSIS — R0789 Other chest pain: Secondary | ICD-10-CM | POA: Diagnosis present

## 2012-07-21 DIAGNOSIS — I252 Old myocardial infarction: Secondary | ICD-10-CM

## 2012-07-21 DIAGNOSIS — K209 Esophagitis, unspecified without bleeding: Secondary | ICD-10-CM | POA: Diagnosis present

## 2012-07-21 DIAGNOSIS — M549 Dorsalgia, unspecified: Secondary | ICD-10-CM | POA: Diagnosis present

## 2012-07-21 DIAGNOSIS — G8929 Other chronic pain: Secondary | ICD-10-CM | POA: Diagnosis present

## 2012-07-21 DIAGNOSIS — R1012 Left upper quadrant pain: Secondary | ICD-10-CM | POA: Diagnosis present

## 2012-07-21 DIAGNOSIS — R079 Chest pain, unspecified: Secondary | ICD-10-CM

## 2012-07-21 DIAGNOSIS — I4891 Unspecified atrial fibrillation: Secondary | ICD-10-CM | POA: Diagnosis present

## 2012-07-21 DIAGNOSIS — Z515 Encounter for palliative care: Secondary | ICD-10-CM

## 2012-07-21 DIAGNOSIS — I509 Heart failure, unspecified: Secondary | ICD-10-CM | POA: Diagnosis present

## 2012-07-21 DIAGNOSIS — R82998 Other abnormal findings in urine: Secondary | ICD-10-CM | POA: Diagnosis present

## 2012-07-21 DIAGNOSIS — R531 Weakness: Secondary | ICD-10-CM

## 2012-07-21 DIAGNOSIS — N390 Urinary tract infection, site not specified: Secondary | ICD-10-CM | POA: Diagnosis present

## 2012-07-21 DIAGNOSIS — K5900 Constipation, unspecified: Secondary | ICD-10-CM | POA: Diagnosis present

## 2012-07-21 DIAGNOSIS — F419 Anxiety disorder, unspecified: Secondary | ICD-10-CM

## 2012-07-21 DIAGNOSIS — I2510 Atherosclerotic heart disease of native coronary artery without angina pectoris: Principal | ICD-10-CM | POA: Diagnosis present

## 2012-07-21 DIAGNOSIS — K449 Diaphragmatic hernia without obstruction or gangrene: Secondary | ICD-10-CM | POA: Diagnosis present

## 2012-07-21 DIAGNOSIS — I5032 Chronic diastolic (congestive) heart failure: Secondary | ICD-10-CM | POA: Diagnosis present

## 2012-07-21 DIAGNOSIS — N184 Chronic kidney disease, stage 4 (severe): Secondary | ICD-10-CM | POA: Diagnosis present

## 2012-07-21 HISTORY — DX: Other chronic pain: G89.29

## 2012-07-21 HISTORY — DX: Dorsalgia, unspecified: M54.9

## 2012-07-21 HISTORY — DX: Heart failure, unspecified: I50.9

## 2012-07-21 LAB — COMPREHENSIVE METABOLIC PANEL
ALT: 17 U/L (ref 0–35)
Alkaline Phosphatase: 76 U/L (ref 39–117)
Alkaline Phosphatase: 81 U/L (ref 39–117)
BUN: 27 mg/dL — ABNORMAL HIGH (ref 6–23)
BUN: 25 mg/dL — ABNORMAL HIGH (ref 6–23)
CO2: 26 mEq/L (ref 19–32)
CO2: 24 mEq/L (ref 19–32)
GFR calc Af Amer: 45 mL/min — ABNORMAL LOW (ref >90)
GFR calc Af Amer: 49 mL/min — ABNORMAL LOW (ref >90)
GFR calc non Af Amer: 39 mL/min — ABNORMAL LOW (ref >90)
GFR calc non Af Amer: 43 mL/min — ABNORMAL LOW (ref >90)
Glucose, Bld: 98 mg/dL (ref 70–99)
Glucose, Bld: 120 mg/dL — ABNORMAL HIGH (ref 70–99)
Potassium: 4.6 mEq/L (ref 3.5–5.1)
Potassium: 4.7 mEq/L (ref 3.5–5.1)
Sodium: 134 mEq/L — ABNORMAL LOW (ref 135–145)
Total Bilirubin: 0.3 mg/dL (ref 0.3–1.2)
Total Protein: 7.7 g/dL (ref 6.0–8.3)
Total Protein: 7.8 g/dL (ref 6.0–8.3)

## 2012-07-21 LAB — CBC WITH DIFFERENTIAL/PLATELET
Eosinophils Absolute: 0.2 10*3/uL (ref 0.0–0.7)
Eosinophils Relative: 3 % (ref 0–5)
Hemoglobin: 13.9 g/dL (ref 12.0–15.0)
Lymphs Abs: 1.9 10*3/uL (ref 0.7–4.0)
MCH: 30.7 pg (ref 26.0–34.0)
MCV: 91.4 fL (ref 78.0–100.0)
Monocytes Relative: 5 % (ref 3–12)
Neutrophils Relative %: 63 % (ref 43–77)
RBC: 4.53 MIL/uL (ref 3.87–5.11)

## 2012-07-21 LAB — MAGNESIUM: Magnesium: 2.3 mg/dL (ref 1.5–2.5)

## 2012-07-21 LAB — URINALYSIS, ROUTINE W REFLEX MICROSCOPIC
Bilirubin Urine: NEGATIVE
Glucose, UA: NEGATIVE mg/dL
Ketones, ur: NEGATIVE mg/dL
Protein, ur: 100 mg/dL — AB
Urobilinogen, UA: 0.2 mg/dL (ref 0.0–1.0)

## 2012-07-21 LAB — PROTIME-INR
INR: 0.94 (ref 0.00–1.49)
Prothrombin Time: 12 seconds (ref 11.6–15.2)
Prothrombin Time: 12.5 seconds (ref 11.6–15.2)

## 2012-07-21 LAB — CBC
HCT: 38.4 % (ref 36.0–46.0)
Hemoglobin: 12.6 g/dL (ref 12.0–15.0)
MCHC: 32.8 g/dL (ref 30.0–36.0)
RBC: 4.18 MIL/uL (ref 3.87–5.11)

## 2012-07-21 LAB — POCT I-STAT TROPONIN I: Troponin i, poc: 0.01 ng/mL (ref 0.00–0.08)

## 2012-07-21 MED ORDER — HEPARIN BOLUS VIA INFUSION
4000.0000 [IU] | Freq: Once | INTRAVENOUS | Status: AC
  Administered 2012-07-21: 4000 [IU] via INTRAVENOUS

## 2012-07-21 MED ORDER — TRAMADOL HCL 50 MG PO TABS
25.0000 mg | ORAL_TABLET | Freq: Three times a day (TID) | ORAL | Status: DC
  Administered 2012-07-21 – 2012-07-22 (×3): 25 mg via ORAL
  Filled 2012-07-21 (×4): qty 1

## 2012-07-21 MED ORDER — HEPARIN (PORCINE) IN NACL 100-0.45 UNIT/ML-% IJ SOLN
600.0000 [IU]/h | INTRAMUSCULAR | Status: DC
  Administered 2012-07-21: 600 [IU]/h via INTRAVENOUS
  Filled 2012-07-21: qty 250

## 2012-07-21 MED ORDER — HYDRALAZINE HCL 10 MG PO TABS
10.0000 mg | ORAL_TABLET | Freq: Two times a day (BID) | ORAL | Status: DC
  Administered 2012-07-21 – 2012-07-22 (×2): 10 mg via ORAL
  Filled 2012-07-21 (×3): qty 1

## 2012-07-21 MED ORDER — NITROGLYCERIN 0.4 MG SL SUBL: 0.4000 mg | SUBLINGUAL_TABLET | SUBLINGUAL | Status: DC | PRN

## 2012-07-21 MED ORDER — ONDANSETRON HCL 4 MG/2ML IJ SOLN: 4.0000 mg | Freq: Four times a day (QID) | INTRAMUSCULAR | Status: DC | PRN

## 2012-07-21 MED ORDER — SODIUM CHLORIDE 0.9 % IV SOLN
250.0000 mL | INTRAVENOUS | Status: DC | PRN
  Administered 2012-07-22: 250 mL via INTRAVENOUS

## 2012-07-21 MED ORDER — PANTOPRAZOLE SODIUM 40 MG PO TBEC
40.0000 mg | DELAYED_RELEASE_TABLET | Freq: Every day | ORAL | Status: DC
  Administered 2012-07-22 – 2012-07-25 (×4): 40 mg via ORAL
  Filled 2012-07-21 (×4): qty 1

## 2012-07-21 MED ORDER — ACETAMINOPHEN 325 MG PO TABS
650.0000 mg | ORAL_TABLET | ORAL | Status: DC | PRN
  Administered 2012-07-22: 650 mg via ORAL
  Filled 2012-07-21: qty 2

## 2012-07-21 MED ORDER — PINDOLOL 5 MG PO TABS
2.5000 mg | ORAL_TABLET | Freq: Three times a day (TID) | ORAL | Status: DC
  Administered 2012-07-21 – 2012-07-22 (×3): 2.5 mg via ORAL
  Filled 2012-07-21 (×4): qty 1

## 2012-07-21 MED ORDER — HEPARIN SODIUM (PORCINE) 1000 UNIT/ML IJ SOLN
4000.0000 [IU] | Freq: Once | INTRAMUSCULAR | Status: DC
  Filled 2012-07-21: qty 4

## 2012-07-21 MED ORDER — ASPIRIN EC 81 MG PO TBEC
81.0000 mg | DELAYED_RELEASE_TABLET | Freq: Every day | ORAL | Status: DC
  Administered 2012-07-22 – 2012-07-25 (×4): 81 mg via ORAL
  Filled 2012-07-21 (×4): qty 1

## 2012-07-21 MED ORDER — SODIUM CHLORIDE 0.9 % IJ SOLN
3.0000 mL | INTRAMUSCULAR | Status: DC | PRN
  Administered 2012-07-22: 3 mL via INTRAVENOUS

## 2012-07-21 MED ORDER — HEPARIN SODIUM (PORCINE) 5000 UNIT/ML IJ SOLN
5000.0000 [IU] | Freq: Three times a day (TID) | INTRAMUSCULAR | Status: DC
  Administered 2012-07-22 – 2012-07-25 (×11): 5000 [IU] via SUBCUTANEOUS
  Filled 2012-07-21 (×13): qty 1

## 2012-07-21 MED ORDER — MELATONIN 5 MG PO TABS: 10.0000 mg | ORAL_TABLET | Freq: Every day | ORAL | Status: DC

## 2012-07-21 MED ORDER — TORSEMIDE 10 MG PO TABS
10.0000 mg | ORAL_TABLET | ORAL | Status: DC
  Filled 2012-07-21: qty 1

## 2012-07-21 MED ORDER — SODIUM CHLORIDE 0.9 % IJ SOLN
3.0000 mL | Freq: Two times a day (BID) | INTRAMUSCULAR | Status: DC
  Administered 2012-07-22 – 2012-07-24 (×5): 3 mL via INTRAVENOUS

## 2012-07-21 NOTE — ED Notes (Signed)
Cardiology at bedside.

## 2012-07-21 NOTE — ED Notes (Signed)
Per EMS: Pt from home with c/o CP starting at 2000 tonight while resting. Pt given 2 nitro, 325 aspirin prior to arrival with minimal relief of pain. Denies N/V/diahporesis but pt reports feeling SOB, back pain. 1 mm ST elevation noted in lead III that increased during transport, inverted T waves. AO x 4.

## 2012-07-21 NOTE — H&P (Signed)
Patient ID: Yvette [name] MRN: [id number] DOB/AGE: 15-Jun-1916 79 y.o. Admit date: 07/21/2012  Primary Cardiologist: Hochrein  HPI: 79 yo Non-English speaking female with history of PAF, prior NSTEMI (no cath during admission 2012), HTN, diastolic CHF, SSS presented to [facility] ED with c/o chest pain. Code STEMI called in the ED. Pt's EKG is overall unchanged from 2013 with some ST segment changes inferior leads. Code STEMI cancelled upon my arrival to the ED. At the time of my examination, she is pain free. She denies SOB. Her daughter reports upper back pain three days ago that has mostly resolved. She did have a UTI 2 weeks ago but this was treated. Her chest pain started today. She reported upper left quadrant pain with radiation to the chest. She felt weak and fell. Her daughter called EMS.  At this time, no complaints. Chest pain has resolved. Similar presentation 04/14/11 with weakness, abdominal pain. CT showed hydronephrosis. A urology consult was called and for the pyuria/bacteriuria - asymptomatic-if she remains afebrile and has no dysuria, I would not treat her for UTI. Her UA has bacteria and pyuria for over 1 year. For the left hydronephrosis - this appears to be chronic UPJ obstruction as the left kidney parenchyma is thin and no further workup was recommended. Because of the abdominal pain, a GI consult was called. She had an EGD which showed:  1) Mild esophagitis in the distal esophagus  2) Small hiatal hernia  3) Otherwise normal stomach  4) Normal duodenum  She was placed on Protonix and pain control meds.   Review of systems complete and found to be negative unless listed above   Past Medical History  Diagnosis Date  . Paroxysmal atrial flutter [facility] admission 2/5-04/20/2010    High fall risk-not a Coumadin candidate  . Hypertension   . Chronic diastolic heart failure     a. Echo February 2012: EF 55%; moderate LVH; mild AI; mild MR; PASP 39  . Sick sinus syndrome     a. Limits use  of beta blocker-pindolol tolerated  . NSTEMI (non-ST elevated myocardial infarction)     a. Troponin 0.12, 0.13, 0.08 during admission 04/2010 - conservative mgmt.  . Arthritis   . Anxiety   . Hydronephrosis     Chronic    Family History  Problem Relation Age of Onset  . CAD Neg Hx     History   Social History  . Marital Status: Widowed    Spouse Name: N/A    Number of Children: N/A  . Years of Education: N/A   Occupational History  .     Social History Main Topics  . Smoking status: Never Smoker   . Smokeless tobacco: Not on file  . Alcohol Use: No  . Drug Use: No  . Sexually Active: No   Other Topics Concern  . Not on file   Social History Narrative  . No narrative on file    Past Surgical History  Procedure Laterality Date  . Esophagogastroduodenoscopy  04/19/2011    Procedure: ESOPHAGOGASTRODUODENOSCOPY (EGD);  Surgeon: Erick Blinks, MD;  Location: [facility] ENDOSCOPY;  Service: Gastroenterology;  Laterality: N/A;    No Known Allergies  Prior to Admission Meds:  No current facility-administered medications on file prior to encounter.   Current Outpatient Prescriptions on File Prior to Encounter  Medication Sig Dispense Refill  . hydrALAZINE (APRESOLINE) 10 MG tablet Take 1 tablet (10 mg total) by mouth 2 (two) times daily.  60 tablet  11  .  Lysine 500 MG CAPS Take 500 mg by mouth daily.       . Melatonin 5 MG TABS Take 10 mg by mouth at bedtime.       . Multiple Vitamin (MULTIVITAMIN) tablet Take 1 tablet by mouth daily.      . Omega-3 Fatty Acids (FISH OIL PO) Take 1 capsule by mouth daily.        Physical Exam: Blood pressure 179/84, pulse 89, temperature 98.4 F (36.9 C), temperature source Oral, height 4\' 9"  (1.448 m), weight 109 lb (49.442 kg), SpO2 93.00%.    General: Elderly, well developed.  HEENT: OP clear, mucus membranes moist  SKIN: warm, dry. No rashes.  Neuro: No focal deficits  Musculoskeletal: Muscle strength 5/5 all ext  Psychiatric: Mood and  affect normal  Neck: No JVD, no carotid bruits, no thyromegaly, no lymphadenopathy.  Lungs:Clear bilaterally, no wheezes, rhonci, crackles  Cardiovascular: Regular rate and rhythm. No murmurs, gallops or rubs.  Abdomen:Soft. Bowel sounds present. Non-tender.  Extremities: No lower extremity edema. Pulses are 2 + in the bilateral DP/PT.   Labs: POC troponin 0.01.    Radiology: CXR 07/18/12: IMPRESSION:  Cardiomegaly. Central vascular congestion without convincing  pulmonary edema. Left basilar atelectasis. No segmental  infiltrate. Moderate compression deformity mid thoracic spine of  indeterminate age. Clinical correlation is necessary.  EKG: Sinus, rate 86 bpm. T wave inversions lateral, precordial leads (unchanged from previous 9/13), subtle ST elevation inferior leads (unchanged from 9/13)  ASSESSMENT AND PLAN:   1. Chest pain: Resolved upon arrival to ED. After further questioning, this sounds like abdominal pain and upper back pain. CXR this week per primary care for evaluation of SOB showed compression deformity thoracic spine and this could explain her upper back pain. She does not appear currently to be having an acute coronary syndrome. EKG is overall unchanged from 9/13. Will admit to telemetry unit. Will cycle cardiac markers. Continue home meds. Follow up on CXR tonight. If any evidence of fluid overload, can diurese tonight. No indication for urgent cardiac catheterization. Given her age and overall low functional status, she has been felt to be a poor candidate for cardiac cath in the past. Will only start heparin drip if her enzymes are positive.   2. Chronic diastolic CHF: She does not clinically appear to be fluid overloaded. Weight stable per daughter.   3. Sick Sinus syndrome: Currently sinus in 80s. Follow on telemetry. She is on Pindolol at home. Will resume.    MCALHANY,CHRISTOPHER 07/21/2012, 9:27 PM

## 2012-07-21 NOTE — Progress Notes (Addendum)
ANTICOAGULATION CONSULT NOTE - Initial Consult  Pharmacy Consult for Heparin Indication: chest pain/ACS  No Known Allergies  Patient Measurements: Weight: 109 lb (49.442 kg) Heparin Dosing Weight: 49 kg  Vital Signs: BP: 179/84 mmHg (05/11 2053) Pulse Rate: 89 (05/11 2053)  Medical History: Past Medical History  Diagnosis Date  . Paroxysmal atrial flutter [facility] admission 2/5-04/20/2010    High fall risk-not a Coumadin candidate  . Hypertension   . Chronic diastolic heart failure     a. Echo February 2012: EF 55%; moderate LVH; mild AI; mild MR; PASP 39  . Sick sinus syndrome     a. Limits use of beta blocker-pindolol tolerated  . NSTEMI (non-ST elevated myocardial infarction)     a. Troponin 0.12, 0.13, 0.08 during admission 04/2010 - conservative mgmt.  . Arthritis   . Anxiety   . Hydronephrosis     Chronic    Assessment: 79 yo female with c/o CP starting around 20:00PM.  She was given 2 nitroglycerin tablets and a 325 mg dose of Aspirin.  She is noted to have some ST elevation.  Will begin IV heparin as requested.  No noted bleeding complications.  Goal of Therapy:  Heparin level 0.3-0.7 units/ml Monitor platelets by anticoagulation protocol: Yes   Plan:  1.  Heparin 4000 units IV bolus x 1 2.  Heparin infusion at 600 units/hr 3.  Will check heparin level in 8 hours if she doesn't go to cath lab.  Nadara Mustard, PharmD., MS Clinical Pharmacist Pager:  [phone] Thank you for allowing pharmacy to be part of this patients care team. 07/21/2012,8:56 PM

## 2012-07-21 NOTE — Progress Notes (Signed)
PHARMACIST - PHYSICIAN ORDER COMMUNICATION  CONCERNING: P&T Medication Policy on Herbal Medications  DESCRIPTION:  This patient's order for:  melatonin 10mg   has been noted.  This product(s) is classified as an "herbal" or natural product. Due to a lack of definitive safety studies or FDA approval, nonstandard manufacturing practices, plus the potential risk of unknown drug-drug interactions while on inpatient medications, the Pharmacy and Therapeutics Committee does not permit the use of "herbal" or natural products of this type within [facility].   ACTION TAKEN: The pharmacy department is unable to verify this order at this time and your patient has been informed of this safety policy. Please reevaluate patient's clinical condition at discharge and address if the herbal or natural product(s) should be resumed at that time.  Janice Coffin

## 2012-07-21 NOTE — ED Notes (Signed)
Per cardiology, Code Stemi cancelled. Family at bedside. VSS.

## 2012-07-21 NOTE — ED Notes (Signed)
Pt alert, NAD, calm, interactive, VSS, daughter at [facility], daughter denies questions.

## 2012-07-21 NOTE — ED Notes (Addendum)
Rapid response and MD Kohut at bedside. Family at bedside. Pt placed on leads.

## 2012-07-22 ENCOUNTER — Observation Stay (HOSPITAL_COMMUNITY): Payer: Medicaid Other

## 2012-07-22 LAB — CBC
HCT: 39.4 % (ref 36.0–46.0)
Hemoglobin: 12.9 g/dL (ref 12.0–15.0)
MCH: 30.1 pg (ref 26.0–34.0)
MCHC: 32.7 g/dL (ref 30.0–36.0)
MCV: 91.8 fL (ref 78.0–100.0)
Platelets: 271 10*3/uL (ref 150–400)
RBC: 4.29 MIL/uL (ref 3.87–5.11)
RDW: 12.3 % (ref 11.5–15.5)
WBC: 6.4 10*3/uL (ref 4.0–10.5)

## 2012-07-22 LAB — TROPONIN I
Troponin I: <0.3 ng/mL
Troponin I: <0.3 ng/mL (ref <0.30)
Troponin I: <0.3 ng/mL

## 2012-07-22 LAB — BASIC METABOLIC PANEL WITH GFR
BUN: 22 mg/dL (ref 6–23)
CO2: 24 meq/L (ref 19–32)
Calcium: 8.9 mg/dL (ref 8.4–10.5)
Chloride: 104 meq/L (ref 96–112)
Creatinine, Ser: 0.97 mg/dL (ref 0.50–1.10)
GFR calc Af Amer: 56 mL/min — ABNORMAL LOW
GFR calc non Af Amer: 48 mL/min — ABNORMAL LOW
Glucose, Bld: 101 mg/dL — ABNORMAL HIGH (ref 70–99)
Potassium: 4.5 meq/L (ref 3.5–5.1)
Sodium: 135 meq/L (ref 135–145)

## 2012-07-22 LAB — GLUCOSE, CAPILLARY: Glucose-Capillary: 130 mg/dL — ABNORMAL HIGH (ref 70–99)

## 2012-07-22 MED ORDER — FENTANYL CITRATE 0.05 MG/ML IJ SOLN: 50.0000 ug | INTRAMUSCULAR | Status: DC | PRN

## 2012-07-22 MED ORDER — CIPROFLOXACIN HCL 250 MG PO TABS: 250.0000 mg | ORAL_TABLET | Freq: Two times a day (BID) | ORAL | Status: DC

## 2012-07-22 MED ORDER — FENTANYL 12 MCG/HR TD PT72
12.5000 ug | MEDICATED_PATCH | TRANSDERMAL | Status: DC
  Administered 2012-07-22: 12.5 ug via TRANSDERMAL
  Filled 2012-07-22: qty 1

## 2012-07-22 MED ORDER — HYDRALAZINE HCL 25 MG PO TABS
25.0000 mg | ORAL_TABLET | Freq: Four times a day (QID) | ORAL | Status: DC
  Administered 2012-07-22 – 2012-07-24 (×5): 25 mg via ORAL
  Filled 2012-07-22 (×11): qty 1

## 2012-07-22 MED ORDER — MORPHINE SULFATE 2 MG/ML IJ SOLN
2.0000 mg | INTRAMUSCULAR | Status: DC | PRN
  Administered 2012-07-22: 2 mg via INTRAVENOUS
  Filled 2012-07-22 (×2): qty 1

## 2012-07-22 MED ORDER — CIPROFLOXACIN HCL 250 MG PO TABS
250.0000 mg | ORAL_TABLET | Freq: Every day | ORAL | Status: DC
  Administered 2012-07-22: 250 mg via ORAL
  Filled 2012-07-22 (×3): qty 1

## 2012-07-22 MED ORDER — MORPHINE SULFATE 4 MG/ML IJ SOLN
4.0000 mg | INTRAMUSCULAR | Status: DC | PRN
  Administered 2012-07-22: 4 mg via INTRAVENOUS
  Filled 2012-07-22 (×2): qty 1

## 2012-07-22 MED ORDER — TORSEMIDE 10 MG PO TABS
10.0000 mg | ORAL_TABLET | ORAL | Status: DC
  Administered 2012-07-23: 10 mg via ORAL

## 2012-07-22 MED ORDER — MORPHINE SULFATE 2 MG/ML IJ SOLN
INTRAMUSCULAR | Status: AC
  Administered 2012-07-22: 2 mg via INTRAVENOUS
  Filled 2012-07-22: qty 1

## 2012-07-22 MED ORDER — LABETALOL HCL 5 MG/ML IV SOLN
0.5000 mg/min | INTRAVENOUS | Status: DC
  Administered 2012-07-22: 0.5 mg/min via INTRAVENOUS
  Filled 2012-07-22 (×3): qty 100

## 2012-07-22 MED ORDER — MORPHINE SULFATE 4 MG/ML IJ SOLN
4.0000 mg | Freq: Once | INTRAMUSCULAR | Status: AC
  Administered 2012-07-22: 4 mg via INTRAVENOUS

## 2012-07-22 MED ORDER — METOPROLOL TARTRATE 1 MG/ML IV SOLN
5.0000 mg | Freq: Four times a day (QID) | INTRAVENOUS | Status: DC
  Administered 2012-07-22: 5 mg via INTRAVENOUS
  Filled 2012-07-22: qty 5

## 2012-07-22 MED ORDER — HYDRALAZINE HCL 25 MG PO TABS
25.0000 mg | ORAL_TABLET | Freq: Two times a day (BID) | ORAL | Status: DC
  Filled 2012-07-22: qty 1

## 2012-07-22 MED ORDER — MORPHINE SULFATE 4 MG/ML IJ SOLN
4.0000 mg | INTRAMUSCULAR | Status: DC | PRN
  Administered 2012-07-23 (×2): 4 mg via INTRAVENOUS
  Filled 2012-07-22 (×2): qty 1

## 2012-07-22 MED ORDER — TRAMADOL HCL 50 MG PO TABS
50.0000 mg | ORAL_TABLET | Freq: Two times a day (BID) | ORAL | Status: DC
  Administered 2012-07-23 (×2): 50 mg via ORAL
  Filled 2012-07-22 (×5): qty 1

## 2012-07-22 NOTE — Consult Note (Signed)
Family Medicine Teaching Service  Consult Note   Patient name: Yvette [name] Medical record number: [id number] Date of birth: 1916-12-21 Age: 79 y.o. Gender: female  Primary Care Provider: Majel Homer, MD  Chief Complaint: urinary frequency, upper back pain (left side) History of Present Illness: Yvette [name] is a 79 y.o. female with PMHx significant for PAFib, SSS, CAD (NSTEMI no cath 2012), HTN, diastolic CHF admitted for chest pain initially Code STEMI cancelled upon cardiology arrival to ED.  Pt complains of bilateral back pain that irradiate to her left anterior chest. She was seen last Thursday with similar complaint. Daughter helps interpreting and mentioned that pt has had increased in urinary frequency but denies burning with micturition, decreased amount of urine, blood tinted urine, nausea, vomiting, fevers or chills.   Patient Active Problem List   Diagnosis Date Noted  . Chest pain 07/21/2012  . Back pain, thoracic 07/18/2012  . Herpes zoster 03/01/2012  . SOB (shortness of breath) 10/05/2011  . Acute on chronic diastolic CHF (congestive heart failure), NYHA class 1 10/05/2011  . HTN (hypertension) 10/05/2011  . Abnormal TSH 10/04/2011  . Fatigue 10/04/2011  . Esophagitis 05/01/2011  . CKD (chronic kidney disease), stage III 04/26/2011  . Physical deconditioning 04/26/2011  . Anxiety 05/05/2010  . Paroxysmal atrial flutter   . Hypertension   . Chronic diastolic heart failure   . Sick sinus syndrome   . ATRIAL FLUTTER 05/02/2010  . BRADYCARDIA-TACHYCARDIA SYNDROME 05/02/2010   Past Medical History: Past Medical History  Diagnosis Date  . Paroxysmal atrial flutter [facility] admission 2/5-04/20/2010    High fall risk-not a Coumadin candidate  . Hypertension   . Chronic diastolic heart failure     a. Echo February 2012: EF 55%; moderate LVH; mild AI; mild MR; PASP 39  . Sick sinus syndrome     a. Limits use of beta blocker-pindolol tolerated  . NSTEMI (non-ST elevated myocardial  infarction)     a. Troponin 0.12, 0.13, 0.08 during admission 04/2010 - conservative mgmt.  . Arthritis   . Anxiety   . Hydronephrosis     Chronic  . CHF (congestive heart failure)   . Chronic back pain    Past Surgical History: Past Surgical History  Procedure Laterality Date  . Esophagogastroduodenoscopy  04/19/2011    Procedure: ESOPHAGOGASTRODUODENOSCOPY (EGD);  Surgeon: Erick Blinks, MD;  Location: [facility] ENDOSCOPY;  Service: Gastroenterology;  Laterality: N/A;   Social History: History   Social History  . Marital Status: Widowed    Spouse Name: N/A    Number of Children: N/A  . Years of Education: N/A   Occupational History  .     Social History Main Topics  . Smoking status: Never Smoker   . Smokeless tobacco: None  . Alcohol Use: No  . Drug Use: No  . Sexually Active: No   Other Topics Concern  . None   Social History Narrative  . None   Family History: Family History  Problem Relation Age of Onset  . CAD Neg Hx    Allergies: No Known Allergies Current Facility-Administered Medications  Medication Dose Route Frequency Provider Last Rate Last Dose  . 0.9 %  sodium chloride infusion  250 mL Intravenous PRN Kathleene Hazel, MD      . acetaminophen (TYLENOL) tablet 650 mg  650 mg Oral Q4H PRN Kathleene Hazel, MD      . aspirin EC tablet 81 mg  81 mg Oral Daily Kathleene Hazel, MD   81 mg  at 07/22/12 1055  . ciprofloxacin (CIPRO) tablet 250 mg  250 mg Oral Q breakfast Rollene Rotunda, MD      . fentaNYL (DURAGESIC - dosed mcg/hr) 12.5 mcg  12.5 mcg Transdermal Q72H Duke Salvia, MD      . heparin injection 5,000 Units  5,000 Units Subcutaneous Q8H Kathleene Hazel, MD   5,000 Units at 07/22/12 [id number]  . hydrALAZINE (APRESOLINE) tablet 25 mg  25 mg Oral BID Duke Salvia, MD      . nitroGLYCERIN (NITROSTAT) SL tablet 0.4 mg  0.4 mg Sublingual Q5 Min x 3 PRN Kathleene Hazel, MD      . ondansetron [facility]) injection 4 mg  4 mg  Intravenous Q6H PRN Kathleene Hazel, MD      . pantoprazole (PROTONIX) EC tablet 40 mg  40 mg Oral Daily Kathleene Hazel, MD   40 mg at 07/22/12 1049  . pindolol (VISKEN) tablet 2.5 mg  2.5 mg Oral TID Kathleene Hazel, MD   2.5 mg at 07/22/12 1056  . sodium chloride 0.9 % injection 3 mL  3 mL Intravenous Q12H Kathleene Hazel, MD   3 mL at 07/22/12 1058  . sodium chloride 0.9 % injection 3 mL  3 mL Intravenous PRN Kathleene Hazel, MD   3 mL at 07/22/12 1202  . [START ON 07/23/2012] torsemide (DEMADEX) tablet 10 mg  10 mg Oral Custom Rollene Rotunda, MD      . traMADol Janean Sark) tablet 25 mg  25 mg Oral TID Kathleene Hazel, MD   25 mg at 07/22/12 1049    Review Of Systems: Per HPI    Physical Exam: Filed Vitals:   07/22/12 1054  BP: 175/90  Pulse: 84  Temp: 98.4 F (36.9 C)  Resp: 20   Gen:  NAD HEENT: Moist mucous membranes CV: Regular rate and rhythm, no murmurs rubs or gallops  PULM: Clear to auscultation bilaterally. No wheezes/rales/rhonchi ABD: Soft, non tender, non distended, normal bowel sounds. EXT: No edema Neuro: Alert. No neurologic focalization. MSK:  Diffuse tenderness to palpation her upper back mostly on the left but also present on the right.   Labs and Imaging: Lab Results  Component Value Date/Time   NA 135 07/22/2012  5:10 AM   K 4.5 07/22/2012  5:10 AM   CL 104 07/22/2012  5:10 AM   CO2 24 07/22/2012  5:10 AM   BUN 22 07/22/2012  5:10 AM   CREATININE 0.97 07/22/2012  5:10 AM   CREATININE 1.17* 07/15/2012  4:13 PM   GLUCOSE 101* 07/22/2012  5:10 AM   Lab Results  Component Value Date   WBC 6.4 07/22/2012   HGB 12.9 07/22/2012   HCT 39.4 07/22/2012   MCV 91.8 07/22/2012   PLT 271 07/22/2012   Ct scan 04/16/2011 IMPRESSION:  Massive left renal hydronephrosis and renal cortical thinning, without radiopaque renal or ureteral calculus. The ureter is normal in caliber. Findings are most suggestive of chronic ureteropelvic junction  obstruction. Alternative etiologies could include stricture, nonradiopaque calculus, or neoplasm such as transitional cell carcinoma. Further outpatient neurological workup could include renal mass protocol MRI with contrast for improved visualization of a potential obstructing enhancing mass lesion.  CXR  07/21/2012 IMPRESSION:  Prominent cardiomediastinal contours. Interval increased interstitial markings may reflect edema. Lung base opacities are nonspecific and favored to reflect a  combination of scarring and atelectasis. Small effusions suggested.  Assessment and Plan: Yvette [name] is a 79 y.o.  female admitted for chest pain r/o. We are called to consult regarding her pyuria and set outpatient follow up for her chronic pain management.   1. UTI: Pyuria and frequency with urination. Afebrile and normal WBC. Pt was treated with Keflex 07/02/12 and Urine culture at that time did not grew specific bacteria. Urine culture collected yesterday is in process. P/ - Pt with complicated UTI (PMHx of Urinary Tract Obstruction)  pt on Cipro started today. Will  follow up Urine Culture results with antibiotic sensitivity to narrow abx spectrum (this can be f/u as outpatient)     - Renal Ultrasound today.  2. Left thoracic and bilateral upper back pain: seems chronic in nature, improved with fentanyl patch 12.5. P/ Pt can be discharge home with Fentanyl patch and follow up as outpatient with pain clinic.  3. Chest pain r/o: Pt admitted by Cardiology. Appreciate their care and recommendations.  4. Disposition: Pending results of Renal Ultrasound.  D. Piloto Rolene Arbour PGY-2 FMTS

## 2012-07-22 NOTE — Progress Notes (Signed)
Transferred to 2928.  Pt c/o severe pain to left lower side.  Pain management today included Ultram scheduled @ 10a & 4p, Acetaminophen 650 mg po @ 4pm, Fentanyl patch, and Morphine 2mg  IV @ 2:35pm.  Meds have been adjusted this shift.  Last BM last night.  Skin warm and dry.  B/P @ 4:44 pm = 224/90, p = 84.  Pressure was up last night and lowest today in 170s/90s.  Relayed above info to [employer], PAm, who f/u with bedside assessment.  Lopressor 5 mg IV x 1 ordered and Morphine 4 mg IV ordered, along with removal of Fentanyl patch.  All above done prior to transfer to 2928.  Daughter @ bedside for transport.

## 2012-07-22 NOTE — Progress Notes (Signed)
Utilization Review Completed Terryn Redner J. Quinta Eimer, RN, BSN, NCM [phone]  
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
## 2012-07-22 NOTE — Progress Notes (Signed)
Hurman Horn PA notified of pt status, order for extra dose of 4mg  morphine IV now received. BP 258/131. Alinda Money aware. Family in room trying to calm pt.

## 2012-07-22 NOTE — Progress Notes (Signed)
Patient ID: Yvette [name], female   DOB: April 22, 1916, 79 y.o.   MRN: [id number] Subjective: The patient is a 79 y.o. year old female who presents today for f/u.  Shortness of breath appears to have gotten better.  Followed instructions from last time and took 2 extra doses of torsemide.  This appears to have resolved her SOB.  Now she is primarily complaining of some back pain in her mid back that radiates around her sides.  This is intermittent, not present currently, with no obvious agrevating/alleviating factors.  Is keeping her from sleeping.  Denies any frontal chest pain, abd pain, or n/v.  Patient's past medical, social, and family history were reviewed and updated as appropriate. History  Substance Use Topics  . Smoking status: Never Smoker   . Smokeless tobacco: Not on file  . Alcohol Use: No   Objective:  Filed Vitals:   07/18/12 1434  BP: 162/69  Pulse: 71  Temp: 99.2 F (37.3 C)   Gen: NAD, elderly female, slightly tired appearing CV: RRR, no murmurs at this time Resp: CTABL, no remaining crackles Back: No point tenderness, no muscle spasm, no obvious deformity  Assessment/Plan:  Please also see individual problems in problem list for problem-specific plans.

## 2012-07-22 NOTE — Progress Notes (Signed)
Report of "many bacteria" from u/a reported to Ward Givens, NP.  Cipro po ordered.

## 2012-07-22 NOTE — Progress Notes (Signed)
Called by RN re: pt c/o severe back and chest pain. Pt speaks no [place]. Symptoms confirmed by family in room.   On evaluation, patient in visible discomfort, unable to sit still, clutching her left-flank/LUQ. Bibasilar rales on exam. Shallow inspiration. Breathing mildly guarded. Clear S1, S2 without murmurs, rubs or gallops. No peripheral edema. Normoactive BS x 4 quadrants.   VS:  Filed Vitals:   07/22/12 1644  BP: 224/90  Pulse: 84  Temp:   Resp: 24   Troponin-I WNL x 3 since admission last night. pBNP markedly elevated at 6264. CXR with prominent cardiomediastinal contours, increased interstitial markings c/w edema. Lung atelectasis, scarring and small bibasilar pleural effusions. Moderate compression deformity mid thoracic spine.  EKG reveals improved inferior ST elevation, anterolateral TWIs unchanged from prior tracings.   Pt evaluated and plan discussed with Dr. Tenny Craw. 79 yo Asian-American female w/ multiple comorbidities including PMHx s/f PAF, prior NSTEMI (no cath during admission 2012), HTN, diastolic CHF, SSS. Suspect thoracic compression deformity with radiation driving markedly elevated BP and accounting for back/chest pain. Plan to increase hydralazine dosing and frequency. Add metoprolol IV for rate-control. Morphine IV for pain, increase tramadol. Will transfer to SDU. Interventional radiology contacted by Dr. Tenny Craw to consider therapeutic vertebroplasty. Continue current management outlined this AM. Family medicine managing UTI/pyuria. Once pain and BP controlled, will need to address code status and utility of further aggressive work-up (r/o dissection).   Jacqulyn Bath, PA-C 07/22/2012 6:08 PM

## 2012-07-22 NOTE — Consult Note (Signed)
FMTS Attending Daily Note:  Renold Don MD  [phone] pager  Family Practice pager:  [phone] I have discussed this patient with the resident Dr. Aviva Signs.  I agree with their findings, assessment, and care plan.    Additionally: 79 yo Female with history of a fib, SSS, CAD and known NSTEMI in 2012, HFpEF admitted for chest pain.  Ruled out from cardiac standpoint.  FPTS asked for consultation in light of persistent back pain with radiation to chest.  Imp/Plan: 1.  Back pain:  - Chronic pain for patient, this appears to be acute worsening of chronic issue.   - CXR revealed compression deformity/fracture mid-thoracic spine, of undetermined age.   - Currently on Acetaminopen and Fentanyl for pain control.     2.  Hydronephrosis: - CT abdomen from Feb 2013 revealed massive Left renal hydronephrosis with concern for ureteropelvic junction obstruction.   - Persistent pyuria and hemoglobinuria present.  Will obtain renal US as no imaging since 2013 with an eye towards urology consult if no improvement.  Tobey Grim, MD 07/22/2012 4:43 PM

## 2012-07-22 NOTE — Progress Notes (Signed)
Pt's ECG this AM read acute MI, Burge NP shown ECG and did not agree with this pt has had ST elevation before on ECG in ED last night and about a year ago no changes from these prior ECG's; also pt BP was 177/81, HR 75 no S/S pt was actually resting again MD aware will continue to monitor, Thanks, Lavonda Jumbo RN

## 2012-07-22 NOTE — Progress Notes (Signed)
Patient: Yvette [name] Date of Encounter: 07/22/2012, 9:25 AM Admit date: 07/21/2012     Subjective  Yvette [name] is finally resting comfortably. Her daughter is at bedside and reports this is the first time she has been able to rest in 3-4 days. She has had back pain x 3-4 days and yesterday developed chest pain which prompted her daughter to call 911. She is currently without CP or SOB.   Objective  Physical Exam:  Vitals: BP 177/81  Pulse 75  Temp(Src) 97.6 F (36.4 C) (Oral)  Resp 20  Ht 4\' 9"  (1.448 m)  Wt 107 lb 9.4 oz (48.8 kg)  BMI 23.27 kg/m2  SpO2 95% General: Well developed, elderly 79 year old female in no acute distress. Neck: Supple. JVD not elevated. Lungs: Clear bilaterally to auscultation without wheezes, rales, or rhonchi. Breathing is unlabored. Heart: RRR S1 S2 without murmurs, rubs, or gallops.  Abdomen: Soft, non-distended. Extremities: No clubbing or cyanosis. No edema.  Distal pedal pulses are 2+ and equal bilaterally. Neuro: Alert and oriented X 3. Moves all extremities spontaneously. No focal deficits.  Intake/Output:  Intake/Output Summary (Last 24 hours) at 07/22/12 0925 Last data filed at 07/22/12 0919  Gross per 24 hour  Intake    240 ml  Output    100 ml  Net    140 ml    Inpatient Medications:  . aspirin EC  81 mg Oral Daily  . heparin  5,000 Units Subcutaneous Q8H  . hydrALAZINE  10 mg Oral BID  . pantoprazole  40 mg Oral Daily  . pindolol  2.5 mg Oral TID  . sodium chloride  3 mL Intravenous Q12H  . [START ON 07/23/2012] torsemide  10 mg Oral Custom  . traMADol  25 mg Oral TID    Labs:  Recent Labs  07/21/12 2116 07/21/12 2308 07/22/12 0510  NA 134* 134* 135  K 4.6 4.7 4.5  CL 100 100 104  CO2 26 24 24   GLUCOSE 98 120* 101*  BUN 27* 25* 22  CREATININE 1.16* 1.08 0.97  CALCIUM 9.3 9.3 8.9  MG  --  2.3  --     Recent Labs  07/21/12 2116 07/21/12 2308  AST 21 23  ALT 17 16  ALKPHOS 76 81  BILITOT 0.3 0.4  PROT 7.7  7.8  ALBUMIN 3.4* 3.6    Recent Labs  07/21/12 2308  LIPASE 73*  AMYLASE 104    Recent Labs  07/21/12 2308 07/22/12 0510  WBC 6.5 6.4  NEUTROABS 4.1  --   HGB 13.9 12.9  HCT 41.4 39.4  MCV 91.4 91.8  PLT 255 271    Recent Labs  07/21/12 2308 07/22/12 0510  TROPONINI <0.30 <0.30  <0.30    Recent Labs  07/21/12 2308  INR 0.94    Radiology/Studies: Dg Chest 2 View  07/18/2012  *RADIOLOGY REPORT*  Clinical Data: Shortness of breath, chest pain  CHEST - 2 VIEW  Comparison: 04/14/2011  Findings: Cardiomegaly again noted.  Central vascular congestion without convincing pulmonary edema.  Again noted prominent fat pad bilateral cardiophrenic angle.  Left basilar atelectasis.  No segmental infiltrate.  There is moderate compression fracture mid thoracic spine of indeterminate age.  Clinical correlation is necessary.  IMPRESSION: Cardiomegaly.  Central vascular congestion without convincing pulmonary edema.  Left basilar atelectasis.  No segmental infiltrate.  Moderate compression deformity mid thoracic spine of indeterminate age.  Clinical correlation is necessary.   Original Report Authenticated By: Lang Snow  Pop, M.D.    Dg Chest Portable 1 View  07/21/2012  *RADIOLOGY REPORT*  Clinical Data: Codes STEMI  PORTABLE CHEST - 1 VIEW  Comparison: 07/18/2012  Findings: Prominent cardiomediastinal contours.  Diffuse increased interstitial markings.  Central vascular congestion.  Small effusions suggested. Chronic lingular/left lung base opacity.  Mild increased opacity right lung base.  No pneumothorax.  Osteopenia. Aortic atherosclerosis.  IMPRESSION: Prominent cardiomediastinal contours.  Interval increased interstitial markings may reflect edema.  Lung base opacities are nonspecific and favored to reflect a combination of scarring and atelectasis.  Small effusions suggested.   Original Report Authenticated By: Jearld Lesch, M.D.     Telemetry: SR; no arrhythmias    Assessment and  Plan  1. Chest pain, back pain  Resolved. ECG unchanged from Sept 2013. CEs negative. On admission yesterday it seemed she was experiencing mostly back and abdominal pain (history is difficult as she is non-English speaking). CXR this week per primary care for evaluation of SOB showed compression deformity thoracic spine and this could explain her upper back pain. She does not appear currently to be having an acute coronary syndrome. Continue home meds. No indication for urgent cardiac catheterization. Given her age and overall low functional status, she has been felt to be a poor candidate for cardiac cath in the past.  2. Chronic diastolic CHF  She does not clinically appear to be fluid overloaded. Weight stable per daughter. MD to advise ? need to update echo 3. Sick Sinus syndrome  Currently SR 70-80. Follow on telemetry. She is on Pindolol at home. 4. Hypertension Poorly controlled currently. May need to up-titrate hydralazine. May be related to pain  Dr. Graciela Husbands to see Signed, EDMISTEN, BROOKE PA-C  Pt with back pain  And pyuria chronic with flank pain,  Cardiac issues quiescent  Will ask FPTS to see and assist with pain and UTI issues  INcrease hydralazine for BP  Graciela Husbands

## 2012-07-22 NOTE — Assessment & Plan Note (Signed)
Unclear etiology.  Possibilities include MKS and compression fx.  CXR will help with this somewhat.  Will start low-dose tramadol as tylenol is not controlling it, per family, and it is interfering with sleep.

## 2012-07-23 ENCOUNTER — Inpatient Hospital Stay (HOSPITAL_COMMUNITY): Payer: Medicaid Other

## 2012-07-23 ENCOUNTER — Observation Stay (HOSPITAL_COMMUNITY): Payer: Medicaid Other

## 2012-07-23 DIAGNOSIS — I5032 Chronic diastolic (congestive) heart failure: Secondary | ICD-10-CM

## 2012-07-23 DIAGNOSIS — N183 Chronic kidney disease, stage 3 unspecified: Secondary | ICD-10-CM

## 2012-07-23 DIAGNOSIS — I509 Heart failure, unspecified: Secondary | ICD-10-CM

## 2012-07-23 DIAGNOSIS — I5033 Acute on chronic diastolic (congestive) heart failure: Secondary | ICD-10-CM

## 2012-07-23 LAB — URINE CULTURE: Colony Count: >=100000

## 2012-07-23 LAB — GLUCOSE, CAPILLARY: Glucose-Capillary: 126 mg/dL — ABNORMAL HIGH (ref 70–99)

## 2012-07-23 MED ORDER — HYDROCODONE-ACETAMINOPHEN 5-325 MG PO TABS
1.0000 | ORAL_TABLET | ORAL | Status: DC | PRN
  Administered 2012-07-23 – 2012-07-24 (×2): 1 via ORAL
  Filled 2012-07-23 (×2): qty 1

## 2012-07-23 MED ORDER — LEVOFLOXACIN IN D5W 750 MG/150ML IV SOLN
750.0000 mg | INTRAVENOUS | Status: DC
  Administered 2012-07-23: 750 mg via INTRAVENOUS
  Filled 2012-07-23: qty 150

## 2012-07-23 MED ORDER — ACETAMINOPHEN 325 MG PO TABS
650.0000 mg | ORAL_TABLET | Freq: Four times a day (QID) | ORAL | Status: DC
  Administered 2012-07-23 – 2012-07-24 (×4): 650 mg via ORAL
  Filled 2012-07-23 (×5): qty 2

## 2012-07-23 MED ORDER — CIPROFLOXACIN HCL 250 MG PO TABS
250.0000 mg | ORAL_TABLET | Freq: Two times a day (BID) | ORAL | Status: DC
  Administered 2012-07-23: 250 mg via ORAL
  Filled 2012-07-23 (×3): qty 1

## 2012-07-23 MED ORDER — MORPHINE SULFATE 4 MG/ML IJ SOLN: 4.0000 mg | INTRAMUSCULAR | Status: DC | PRN

## 2012-07-23 NOTE — Progress Notes (Signed)
Family Medicine Teaching Service Progress Note Service Page: [phone]  Subjective:  Patient endorsed chest pain this am. Denies dysuria, back/flank pain.  Objective: Temp:  [97.5 F (36.4 C)-98.4 F (36.9 C)] 98.1 F (36.7 C) (05/13 0720) Pulse Rate:  [64-98] 85 (05/13 0845) Resp:  [9-33] 22 (05/13 0845) BP: (82-258)/(33-131) 158/57 mmHg (05/13 0845) SpO2:  [73 %-100 %] 92 % (05/13 0845) Weight:  [110 lb 3.7 oz (50 kg)] 110 lb 3.7 oz (50 kg) (05/13 0323)  Exam: General: chronically ill appearing elderly lady.  NAD. Cardiovascular: RRR. No murmurs, rubs, or gallops. Respiratory: CTAB. No rales, rhonchi or wheezing. Abdomen: soft, nontender, nondistended. Extremities: No LE edema. MSK: No back pain or tenderness to palpation this am.  CBC BMET   Recent Labs Lab 07/21/12 2116 07/21/12 2308 07/22/12 0510  WBC 6.1 6.5 6.4  HGB 12.6 13.9 12.9  HCT 38.4 41.4 39.4  PLT 272 255 271    Recent Labs Lab 07/21/12 2116 07/21/12 2308 07/22/12 0510  NA 134* 134* 135  K 4.6 4.7 4.5  CL 100 100 104  CO2 26 24 24   BUN 27* 25* 22  CREATININE 1.16* 1.08 0.97  GLUCOSE 98 120* 101*  CALCIUM 9.3 9.3 8.9     Imaging/Diagnostic Tests:  US Renal  07/23/2012  *RADIOLOGY REPORT*  Clinical Data: Hypertension  RENAL/URINARY TRACT ULTRASOUND COMPLETE  Comparison:  04/16/2011  Findings:  Right Kidney:  10.1 cm in length.  Normal cortical echogenicity. No hydronephrosis or mass.  Left Kidney:  Moderate left hydronephrosis with severe cortical thinning.  Overall, hydronephrosis has improved.  Bladder:  Right ureteral jet is identified.  The left ureteral jet was not identified.  IMPRESSION: Normal appearance of the right kidney.  Chronic left ureteropelvic junction obstruction with severe atrophy of the left kidney.  Dg Chest Portable 1 View  07/21/2012  *RADIOLOGY REPORT*  Clinical Data: Codes STEMI  PORTABLE CHEST - 1 VIEW  Comparison: 07/18/2012  Findings: Prominent cardiomediastinal  contours.  Diffuse increased interstitial markings.  Central vascular congestion.  Small effusions suggested. Chronic lingular/left lung base opacity.  Mild increased opacity right lung base.  No pneumothorax.  Osteopenia. Aortic atherosclerosis.  IMPRESSION: Prominent cardiomediastinal contours.  Interval increased interstitial markings may reflect edema.  Lung base opacities are nonspecific and favored to reflect a combination of scarring and atelectasis.  Small effusions suggested.     Assessment/Plan: 79 y.o. female admitted for chest pain r/o. We are called to consult regarding her pyuria and set outpatient follow up for her chronic pain management.   # UTI, complicated- Pyuria and frequency with urination on admission.  Afebrile and normal WBC. Pt was treated with Keflex 07/02/12 and Urine culture at that time did not grew specific bacteria. - Urine culture collected 5/12; >100,000 CFU Gram Neg. Rods - Cipro changed to Levaquin for 1 dose (after speaking with urology, patient needs just 3 days of treatment)  # Hydronephrosis - US obtained and revealed chronic left ureteropelvic junction obstruction with severe atrophy of the left kidney. - Spoke with Urologist this am (Dr. Mena Goes).  He consulted 1 year ago and reports that there is nothing to do regarding hydronephrosis especially give age and co-morbidities. - He did, however, recommended only a short 3 day course of antibiotics   # Back pain - Chronic in nature. - Will schedule tylenol.   - Morphine PRN pain.  # Chest pain - Troponin's have been negative x 4. - Management per cardiology.  FEN/GI: Heart healthy diet.  KVO fluids. PPx: Heparin Dispo: Pending clinical improvement. Code Status: Full Code.

## 2012-07-23 NOTE — Progress Notes (Addendum)
Patient: Yvette [name] Date of Encounter: 07/23/2012, 7:07 AM Admit date: 07/21/2012     Subjective  Events overnight noted. Yvette [name] denies CP, flank/abdominal pain or SOB currently.    Objective  Physical Exam:  Vitals: BP 152/58  Pulse 78  Temp(Src) 97.5 F (36.4 C) (Oral)  Resp 14  Ht 4\' 9"  (1.448 m)  Wt 110 lb 3.7 oz (50 kg)  BMI 23.85 kg/m2  SpO2 100% General: Well developed, elderly 79 year old female in no acute distress. Neck: Supple. JVD not elevated. Lungs: Clear bilaterally to auscultation without wheezes, rales, or rhonchi. Breathing is unlabored. Heart: RRR S1 S2 without murmurs, rubs, or gallops.  Abdomen: Soft, non-distended. Extremities: No clubbing or cyanosis. No edema.  Distal pedal pulses are 2+ and equal bilaterally. Neuro: Alert and oriented X 3. Moves all extremities spontaneously. No focal deficits.  Intake/Output:  Intake/Output Summary (Last 24 hours) at 07/23/12 0707 Last data filed at 07/23/12 0600  Gross per 24 hour  Intake 417.59 ml  Output    475 ml  Net -57.41 ml    Inpatient Medications:  . aspirin EC  81 mg Oral Daily  . ciprofloxacin  250 mg Oral Q breakfast  . heparin  5,000 Units Subcutaneous Q8H  . hydrALAZINE  25 mg Oral Q6H  . pantoprazole  40 mg Oral Daily  . sodium chloride  3 mL Intravenous Q12H  . torsemide  10 mg Oral Custom  . traMADol  50 mg Oral Q12H    Labs:  Recent Labs  07/21/12 2116 07/21/12 2308 07/22/12 0510  NA 134* 134* 135  K 4.6 4.7 4.5  CL 100 100 104  CO2 26 24 24   GLUCOSE 98 120* 101*  BUN 27* 25* 22  CREATININE 1.16* 1.08 0.97  CALCIUM 9.3 9.3 8.9  MG  --  2.3  --     Recent Labs  07/21/12 2116 07/21/12 2308  AST 21 23  ALT 17 16  ALKPHOS 76 81  BILITOT 0.3 0.4  PROT 7.7 7.8  ALBUMIN 3.4* 3.6    Recent Labs  07/21/12 2308  LIPASE 73*  AMYLASE 104    Recent Labs  07/21/12 2308 07/22/12 0510  WBC 6.5 6.4  NEUTROABS 4.1  --   HGB 13.9 12.9  HCT 41.4 39.4  MCV  91.4 91.8  PLT 255 271    Recent Labs  07/21/12 2308 07/22/12 0510 07/22/12 1105  TROPONINI <0.30 <0.30  <0.30 <0.30    Recent Labs  07/21/12 2308  INR 0.94    Radiology/Studies: Dg Chest 2 View  07/18/2012  *RADIOLOGY REPORT*  Clinical Data: Shortness of breath, chest pain  CHEST - 2 VIEW  Comparison: 04/14/2011  Findings: Cardiomegaly again noted.  Central vascular congestion without convincing pulmonary edema.  Again noted prominent fat pad bilateral cardiophrenic angle.  Left basilar atelectasis.  No segmental infiltrate.  There is moderate compression fracture mid thoracic spine of indeterminate age.  Clinical correlation is necessary.  IMPRESSION: Cardiomegaly.  Central vascular congestion without convincing pulmonary edema.  Left basilar atelectasis.  No segmental infiltrate.  Moderate compression deformity mid thoracic spine of indeterminate age.  Clinical correlation is necessary.   Original Report Authenticated By: Natasha Mead, M.D.    Dg Chest Portable 1 View  07/21/2012  *RADIOLOGY REPORT*  Clinical Data: Codes STEMI  PORTABLE CHEST - 1 VIEW  Comparison: 07/18/2012  Findings: Prominent cardiomediastinal contours.  Diffuse increased interstitial markings.  Central vascular congestion.  Small effusions  suggested. Chronic lingular/left lung base opacity.  Mild increased opacity right lung base.  No pneumothorax.  Osteopenia. Aortic atherosclerosis.  IMPRESSION: Prominent cardiomediastinal contours.  Interval increased interstitial markings may reflect edema.  Lung base opacities are nonspecific and favored to reflect a combination of scarring and atelectasis.  Small effusions suggested.   Original Report Authenticated By: Jearld Lesch, M.D.     Renal US: pending Telemetry: SR; no arrhythmias    Assessment and Plan  1. Chest pain, back pain  Resolved. ECG unchanged from Sept 2013. CEs negative. On admission yesterday it seemed she was experiencing mostly back and abdominal  pain (history is difficult as she is non-English speaking). CXR this week per primary care for evaluation of SOB showed compression deformity thoracic spine and this could explain her upper back pain. She does not appear currently to be having an acute coronary syndrome. Continue home meds. No indication for urgent cardiac catheterization. Given her age and overall low functional status, she has been felt to be a poor candidate for cardiac cath in the past.  2. Chronic diastolic CHF  She does not clinically appear to be fluid overloaded. Weight stable per daughter. MD to advise ? need to update echo. 3. Sick Sinus syndrome  Currently SR 70-80. Follow on telemetry. She is on Pindolol at home. 4. Hypertension Poorly controlled currently. May need to up-titrate hydralazine. Probably related to pain. 5. UTI/pyuria Per Family Medicine. Renal US pending.  Dr. Graciela Husbands to see Signed, EDMISTEN, BROOKE PA-C  This is a noncardiac issue.  BP is labile and certainly aggravated by pain problems   Appreciate the involvement by FPTS-- it may be ideal for patients care if they would assume primary responsibility as the issues relate most to pain management and urinary tract, things for which i have little expertise

## 2012-07-23 NOTE — Progress Notes (Signed)
Called by nurse to review ECG. Had brief, nonsustained AF this AM. Yvette [name] has known h/o PAF (not anticoagulated due to high bleeding/fall risk) and chronic ST abnormalities. ECG done this AM shows SR with mild inferior ST elevation and lateral ST depression with inverted T waves, unchanged from previous ECGs dating back to Sept 2013. Yvette [name] is now back in SR. She remains hemodynamically stable. No new cardiac recs. Family medicine to assume care. Will continue to follow.

## 2012-07-23 NOTE — Progress Notes (Signed)
FMTS Attending Daily Note:  Renold Don MD  [phone] pager  Family Practice pager:  [phone] I have seen and examined this patient and have reviewed their chart. I have discussed this patient with the resident. I agree with the resident's findings, assessment and care plan.  Additionally:  Patient s/p morphine and therfore sleeping.  Daughter and niece both state patient was complaining of LUQ pain earlier today.  Still with chronic back pain.  After further discussion with niece, back pain to this degree of severity for past week.  Patient arousable, points to LUQ when asked about pain (through interpreter).  PERRL, EOMI.  MMM.  RRR, LUngs clear.  Abdomen distended but without tenderness.  No LE edema  Imp/Plan:  1.  Urinary tract infection:   - culture + GNR - On Levaquin currently - Likely secondary to chronic hydronephrosis.  Urology reports no surgical intervention for hydronephrosis.    2.  Back pain: - Seems to be acute on chronic issue - Has history of compression deformities on previous later CXR - Switching to scheduled Tylenol and PO Vicodin for relief.  Currently on IV morphine.   3.  Chest pain:   - still with some mild chest pain.  Troponins negative and EKG unchanged from prior - Has chronic atelectasis/scarring LLL since at least 2009  4.  HTN: - Currently on Labetolol drip.   - Will need to be titrated down before we can transfer her to floor.    Tobey Grim, MD 07/23/2012 4:14 PM

## 2012-07-23 NOTE — Consult Note (Deleted)
Family Medicine Teaching Service Consult Note Service Page: [phone]  Subjective:  Patient endorse chest pain this am. Denies dysuria, back/flank pain.  Objective: Temp:  [97.5 F (36.4 C)-98.4 F (36.9 C)] 98.1 F (36.7 C) (05/13 0720) Pulse Rate:  [64-98] 85 (05/13 0845) Resp:  [9-33] 22 (05/13 0845) BP: (82-258)/(33-131) 158/57 mmHg (05/13 0845) SpO2:  [73 %-100 %] 92 % (05/13 0845) Weight:  [110 lb 3.7 oz (50 kg)] 110 lb 3.7 oz (50 kg) (05/13 0323)  Exam: General: chronically ill appearing elderly lady.  NAD. Cardiovascular: RRR. No murmurs, rubs, or gallops. Respiratory: CTAB. No rales, rhonchi or wheezing. Abdomen: soft, nontender, nondistended. Extremities: No LE edema. MSK: No back pain or tenderness to palpation this am.  CBC BMET   Recent Labs Lab 07/21/12 2116 07/21/12 2308 07/22/12 0510  WBC 6.1 6.5 6.4  HGB 12.6 13.9 12.9  HCT 38.4 41.4 39.4  PLT 272 255 271    Recent Labs Lab 07/21/12 2116 07/21/12 2308 07/22/12 0510  NA 134* 134* 135  K 4.6 4.7 4.5  CL 100 100 104  CO2 26 24 24   BUN 27* 25* 22  CREATININE 1.16* 1.08 0.97  GLUCOSE 98 120* 101*  CALCIUM 9.3 9.3 8.9     Imaging/Diagnostic Tests:  US Renal  07/23/2012  *RADIOLOGY REPORT*  Clinical Data: Hypertension  RENAL/URINARY TRACT ULTRASOUND COMPLETE  Comparison:  04/16/2011  Findings:  Right Kidney:  10.1 cm in length.  Normal cortical echogenicity. No hydronephrosis or mass.  Left Kidney:  Moderate left hydronephrosis with severe cortical thinning.  Overall, hydronephrosis has improved.  Bladder:  Right ureteral jet is identified.  The left ureteral jet was not identified.  IMPRESSION: Normal appearance of the right kidney.  Chronic left ureteropelvic junction obstruction with severe atrophy of the left kidney.  Dg Chest Portable 1 View  07/21/2012  *RADIOLOGY REPORT*  Clinical Data: Codes STEMI  PORTABLE CHEST - 1 VIEW  Comparison: 07/18/2012  Findings: Prominent cardiomediastinal  contours.  Diffuse increased interstitial markings.  Central vascular congestion.  Small effusions suggested. Chronic lingular/left lung base opacity.  Mild increased opacity right lung base.  No pneumothorax.  Osteopenia. Aortic atherosclerosis.  IMPRESSION: Prominent cardiomediastinal contours.  Interval increased interstitial markings may reflect edema.  Lung base opacities are nonspecific and favored to reflect a combination of scarring and atelectasis.  Small effusions suggested.     Assessment/Plan: 79 y.o. female admitted for chest pain r/o. We are called to consult regarding her pyuria and set outpatient follow up for her chronic pain management.   # UTI, complicated- Pyuria and frequency with urination on admission.  Afebrile and normal WBC. Pt was treated with Keflex 07/02/12 and Urine culture at that time did not grew specific bacteria. - Urine culture collected 5/12; >100,000 CFU Gram Neg. Rods - Will continue Cipro 250 mg (changed to BID dosing today).  # Hydronephrosis - US obtained and revealed chronic left ureteropelvic junction obstruction with severe atrophy of the left kidney. - Will be consulting urology this am.   # Back pain - Chronic in nature. - Will continue Tylenol and Fentanyl at this time. - Patient can be discharged on Fentanyl patch and Tylenol.  # Chest pain - Troponin's have been negative x 4. - Management per cardiology.  FEN/GI: Heart healthy diet. KVO fluids. PPx: Heparin Dispo: Pending clinical improvement. Code Status: Full Code.  Everlene Other, DO 07/23/2012, 9:06 AM

## 2012-07-24 ENCOUNTER — Inpatient Hospital Stay (HOSPITAL_COMMUNITY): Payer: Medicaid Other

## 2012-07-24 DIAGNOSIS — F411 Generalized anxiety disorder: Secondary | ICD-10-CM

## 2012-07-24 DIAGNOSIS — R0602 Shortness of breath: Secondary | ICD-10-CM

## 2012-07-24 DIAGNOSIS — R5383 Other fatigue: Secondary | ICD-10-CM

## 2012-07-24 DIAGNOSIS — I5032 Chronic diastolic (congestive) heart failure: Secondary | ICD-10-CM

## 2012-07-24 DIAGNOSIS — R531 Weakness: Secondary | ICD-10-CM | POA: Insufficient documentation

## 2012-07-24 DIAGNOSIS — R079 Chest pain, unspecified: Secondary | ICD-10-CM

## 2012-07-24 DIAGNOSIS — R5381 Other malaise: Secondary | ICD-10-CM

## 2012-07-24 DIAGNOSIS — R1012 Left upper quadrant pain: Secondary | ICD-10-CM

## 2012-07-24 DIAGNOSIS — Z515 Encounter for palliative care: Secondary | ICD-10-CM

## 2012-07-24 HISTORY — DX: Encounter for palliative care: Z51.5

## 2012-07-24 LAB — GRAM STAIN

## 2012-07-24 LAB — LIPASE, BLOOD: Lipase: 52 U/L (ref 11–59)

## 2012-07-24 LAB — BASIC METABOLIC PANEL
Calcium: 9.3 mg/dL (ref 8.4–10.5)
Calcium: 9.8 mg/dL (ref 8.4–10.5)
GFR calc non Af Amer: 31 mL/min — ABNORMAL LOW (ref >90)
GFR calc non Af Amer: 32 mL/min — ABNORMAL LOW (ref >90)
Glucose, Bld: 125 mg/dL — ABNORMAL HIGH (ref 70–99)
Sodium: 136 mEq/L (ref 135–145)
Sodium: 136 mEq/L (ref 135–145)

## 2012-07-24 LAB — URINALYSIS, ROUTINE W REFLEX MICROSCOPIC
Glucose, UA: NEGATIVE mg/dL
Ketones, ur: NEGATIVE mg/dL
Nitrite: NEGATIVE
Specific Gravity, Urine: 1.012 (ref 1.005–1.030)
pH: 6.5 (ref 5.0–8.0)

## 2012-07-24 LAB — URINE MICROSCOPIC-ADD ON

## 2012-07-24 MED ORDER — BISACODYL 10 MG RE SUPP: 10.0000 mg | Freq: Every day | RECTAL | Status: DC | PRN

## 2012-07-24 MED ORDER — MORPHINE SULFATE 2 MG/ML IJ SOLN
INTRAMUSCULAR | Status: AC
  Administered 2012-07-24: 1 mg via INTRAVENOUS
  Filled 2012-07-24: qty 1

## 2012-07-24 MED ORDER — HYDRALAZINE HCL 50 MG PO TABS
50.0000 mg | ORAL_TABLET | Freq: Four times a day (QID) | ORAL | Status: DC
  Administered 2012-07-24 – 2012-07-25 (×5): 50 mg via ORAL
  Filled 2012-07-24 (×8): qty 1

## 2012-07-24 MED ORDER — PINDOLOL 5 MG PO TABS
2.5000 mg | ORAL_TABLET | Freq: Three times a day (TID) | ORAL | Status: DC
  Administered 2012-07-24 – 2012-07-25 (×4): 2.5 mg via ORAL
  Filled 2012-07-24 (×7): qty 1

## 2012-07-24 MED ORDER — FUROSEMIDE 10 MG/ML IJ SOLN
INTRAMUSCULAR | Status: AC
  Administered 2012-07-24: 40 mg
  Filled 2012-07-24: qty 4

## 2012-07-24 MED ORDER — OXYCODONE HCL 5 MG PO TABS
5.0000 mg | ORAL_TABLET | ORAL | Status: DC | PRN
  Administered 2012-07-24 (×3): 5 mg via ORAL
  Filled 2012-07-24 (×3): qty 1

## 2012-07-24 MED ORDER — MORPHINE SULFATE 2 MG/ML IJ SOLN: 1.0000 mg | INTRAMUSCULAR | Status: DC | PRN

## 2012-07-24 MED ORDER — SENNA 8.6 MG PO TABS
2.0000 | ORAL_TABLET | Freq: Every day | ORAL | Status: DC
  Administered 2012-07-24 – 2012-07-25 (×2): 17.2 mg via ORAL
  Filled 2012-07-24 (×2): qty 2

## 2012-07-24 MED ORDER — SODIUM CHLORIDE 0.9 % IV SOLN: INTRAVENOUS | Status: DC

## 2012-07-24 MED ORDER — MORPHINE SULFATE 2 MG/ML IJ SOLN: 2.0000 mg | INTRAMUSCULAR | Status: DC | PRN

## 2012-07-24 MED ORDER — MORPHINE SULFATE 2 MG/ML IJ SOLN
1.0000 mg | INTRAMUSCULAR | Status: DC | PRN
  Administered 2012-07-24: 1 mg via INTRAVENOUS

## 2012-07-24 MED ORDER — OXYCODONE HCL ER 10 MG PO T12A
10.0000 mg | EXTENDED_RELEASE_TABLET | Freq: Two times a day (BID) | ORAL | Status: DC
  Administered 2012-07-24 – 2012-07-25 (×3): 10 mg via ORAL
  Filled 2012-07-24 (×3): qty 1

## 2012-07-24 MED ORDER — MORPHINE SULFATE 2 MG/ML IJ SOLN
0.5000 mg | Freq: Once | INTRAMUSCULAR | Status: AC
  Administered 2012-07-24: 0.5 mg via INTRAVENOUS
  Filled 2012-07-24: qty 1

## 2012-07-24 MED ORDER — FUROSEMIDE 8 MG/ML PO SOLN
40.0000 mg | Freq: Once | ORAL | Status: AC
  Administered 2012-07-24: 40 mg via ORAL
  Filled 2012-07-24 (×2): qty 5

## 2012-07-24 NOTE — Progress Notes (Signed)
Yvette [name] 79 y.o. Received a page patient has increased dysuria and abdominal pain. [place] resulted with new right sided hydronephrosis, improving left. Bladder scan showed ~54mL in bladder. Patient is moaning in pain. Family is in the room and translating. Patient has skin breakdown on bilateral labia and skin folds that started 2 days ago per family. Patient reports pain is in the center of abdomen. She has not had a BM since Saturday.   O:BP 118/63  Pulse 76  Temp(Src) 97.6 F (36.4 C) (Oral)  Resp 20  Ht 4\' 9"  (1.448 m)  Wt 114 lb 6.7 oz (51.9 kg)  BMI 24.75 kg/m2  SpO2 95% - Per nurse, patient has been tachycardiac to 140's the past few hours.  GEN: Patient is moaning in pain, rocking side with legs.  ABD: Soft, with Bladder distention. BS present.  A/P: - Bladder scanned ~567mL, placed foley catheter with >665mL return immediately. Patient not relieved from her pain as expected.  UA, Gram stain and culture sent.  - Concern for possible sepsis with tachycardia. Patient has been afebrile and treated with Cipro/levaquin for UTI that ended 5/13. If not relieved will consider addition of BX and restart empiric abx  - Soap suds enema for probable constipation, that maybe exacerbating her abdominal pain.  - If still not able to comfort her, or VSS  will obtain CT of pelvic/abdomen.

## 2012-07-24 NOTE — Progress Notes (Signed)
FMTS Interval Progress Note  Called by nursing regarding patient in increased pain. Previously had pain that responded to 0.5 mg morphine for about 1.5 hours. Now nurse reports is very uncomfortable.  Went to evaluate patient. She has just returned from abd [place] and is complaining of pain. She is writhing in pain and pulling her legs to a flexed position. States pain is all over her abdomen though mostly points to suprapubic and inguinal area.  PE Gen: uncomfortable appearing Abd: soft, TTP throughout, though worse in lower quadrants, no guarding or rebound, no masses palpated, no CVA tenderness MSK: no spinal process tenderness, TTP over left lower ribs  A/P: patient is a 79 yo female here for CP rule out, who has ruled out, and also with chronic left uretopelvic junction obstruction with left hydronephrosis. Patient appears to be in quite a bit of pain at this time. Just returned from abdominal US. Given lack of guarding, rebound, and abdominal tenseness I do not believe this is an acute abdomen. Potentially this pain is related to her chronic urinary obstruction. Also potentially related to constipation. -will await results of abd US -obtain BMET as planned and add CBC -will give morphine 1 mg q4 hr prn -continue oxy ER 10 mg q12 hr -d/c oxy IR-will restart in the morning -if renal US returns with out cause for pain, will give patient enema for relief of constipation  Marikay Alar, MD PGY1 FMTS

## 2012-07-24 NOTE — Progress Notes (Signed)
FMTS Interval Progress Note  Received call from nursing that patient was short of breath and desat'd to 88%. She was placed on 2L [place] and and O2 sats came up to 92%. Went to evaluate patient and she continues to complain of pain in her left side. She was holding her left anterior rib cage.   PE: Gen: Uncomfortable appearing CV: rrr, no mrg Pulm: CTAB, good air movement, no wheezes or crackles Ext: no LE swelling, non-tender calves bilaterally, no cords palpated  A/P: patient is a 79 yo female here for CP rule out, who has ruled out, and also with chronic left uretopelvic junction obstruction. Patient appears to be in quite a bit of pain at this time. Given that her lungs sound clear and that her vitals are stable with exception of O2 requirement it seems unlikely that she has an infectious or thrombotic process causing this O2 requirement. Most likely due to decreased inspiration related to pain level. -as discussed with palliative care, we will start oxycodone ER 10 mg and continue oxycodone IR q4 hr -will continue to follow the patients respiratory status -will arrange for home O2 if needed  Yvette Alar, MD PGY1 FMTS

## 2012-07-24 NOTE — Progress Notes (Signed)
Thank you for consulting the Palliative Medicine Team at [facility] to meet your patient's and family's needs.   The reason that you asked [place] to see your patient is for symptom management, hospice options  We have scheduled your patient for a meeting: today Wednesday 5/14 @ 12 noon  The Surrogate decision maker is: daughter [place] Contact information: c: [phone]  Other family members that need to be present: none, per daughter just [place] and her niece/patient's granddaughter, Lowella Curb currently in room  Your patient is able/unable to participate: unable to participate due to language barrier - patient speaks [place] does not understand English per daughter - daughter declined translator     Valente David, RN 07/24/2012, 9:27 AM Palliative Medicine Team RN Liaison [phone]

## 2012-07-24 NOTE — Progress Notes (Signed)
Pt complaining of SOB and worsening weakness, O2 sat 88% on room air and increased to 92 % with 2l O2 via nasal cannula. BP 137/83 P 87. Paged attending team. Will continue to monitor. Pt feels better with oxygen supplementation.

## 2012-07-24 NOTE — Progress Notes (Signed)
Patient: Yvette [name] Date of Encounter: 07/24/2012, 10:04 AM Admit date: 07/21/2012     Subjective  Ms. Yvette [name]'s family report she denies chest pain or SOB.   Objective  Physical Exam: Vitals: BP 166/75  Pulse 86  Temp(Src) 98.4 F (36.9 C) (Oral)  Resp 20  Ht 4\' 9"  (1.448 m)  Wt 114 lb 6.7 oz (51.9 kg)  BMI 24.75 kg/m2  SpO2 94% General: Well developed, elderly 79 year old female in no acute distress.  Neck: Supple. JVD not elevated.  Lungs: Clear bilaterally to auscultation without wheezes, rales, or rhonchi. Breathing is unlabored.  Heart: RRR S1 S2 without murmurs, rubs, or gallops.  Abdomen: Soft, non-distended.  Extremities: No clubbing or cyanosis. No edema. Distal pedal pulses are 2+ and equal bilaterally.  Neuro: Alert and oriented X 3. Moves all extremities spontaneously. No focal deficits.  Intake/Output:  Intake/Output Summary (Last 24 hours) at 07/24/12 1004 Last data filed at 07/24/12 0900  Gross per 24 hour  Intake  543.4 ml  Output    150 ml  Net  393.4 ml    Inpatient Medications:  . acetaminophen  650 mg Oral Q6H  . aspirin EC  81 mg Oral Daily  . heparin  5,000 Units Subcutaneous Q8H  . hydrALAZINE  25 mg Oral Q6H  . pantoprazole  40 mg Oral Daily  . pindolol  2.5 mg Oral TID  . sodium chloride  3 mL Intravenous Q12H    Labs:  Recent Labs  07/21/12 2116 07/21/12 2308 07/22/12 0510  NA 134* 134* 135  K 4.6 4.7 4.5  CL 100 100 104  CO2 26 24 24   GLUCOSE 98 120* 101*  BUN 27* 25* 22  CREATININE 1.16* 1.08 0.97  CALCIUM 9.3 9.3 8.9  MG  --  2.3  --     Recent Labs  07/21/12 2116 07/21/12 2308  AST 21 23  ALT 17 16  ALKPHOS 76 81  BILITOT 0.3 0.4  PROT 7.7 7.8  ALBUMIN 3.4* 3.6    Recent Labs  07/21/12 2308  LIPASE 73*  AMYLASE 104    Recent Labs  07/21/12 2308 07/22/12 0510  WBC 6.5 6.4  NEUTROABS 4.1  --   HGB 13.9 12.9  HCT 41.4 39.4  MCV 91.4 91.8  PLT 255 271    Recent Labs  07/21/12 2308  07/22/12 0510 07/22/12 1105  TROPONINI <0.30 <0.30  <0.30 <0.30    Recent Labs  07/21/12 2308  INR 0.94    Radiology/Studies: 07/21/2012   *RADIOLOGY REPORT*  Clinical Data: Codes STEMI  PORTABLE CHEST - 1 VIEW  Comparison: 07/18/2012  Findings: Prominent cardiomediastinal contours.  Diffuse increased interstitial markings.  Central vascular congestion.  Small effusions suggested. Chronic lingular/left lung base opacity.  Mild increased opacity right lung base.  No pneumothorax.  Osteopenia. Aortic atherosclerosis.  IMPRESSION: Prominent cardiomediastinal contours.  Interval increased interstitial markings may reflect edema.  Lung base opacities are nonspecific and favored to reflect a combination of scarring and atelectasis.  Small effusions suggested.   Original Report Authenticated By: Jearld Lesch, M.D.    Telemetry: currently SR    Assessment and Plan  1. Chest pain - resolved; atypical for angina; CEs negative; has chronic ST abnormalities; ECGs show SR with mild inferior ST elevation and lateral ST depression with inverted T waves, unchanged from previous ECGs dating back to Sept 2013 2. Chronic diastolic HF - stable; does not appear volume overloaded 3. PAF - on  BB; not a candidate for anticoagulation due to advanced age and increased fall risk 4. HTN - BP control improving with pain management and up-titration of hydralazine 5. Complicated UTI and hydronephrosis - per primary team/FMTS  Signed, EDMISTEN, BROOKE PA-C  Pt seen only by the PA

## 2012-07-24 NOTE — Care Management Note (Signed)
    Page 1 of 2   07/25/2012     11:29:37 AM   CARE MANAGEMENT NOTE 07/25/2012  Patient:  Yvette [name], Yvette [name]   Account Number:  [id number]  Date Initiated:  07/24/2012  Documentation initiated by:  Junius Creamer  Subjective/Objective Assessment:   adm w ch pain     Action/Plan:   lives w fam,hx of [facility] homecare   Anticipated DC Date:     Anticipated DC Plan:  HOME W HOME HEALTH SERVICES  In-house referral  Hospice / Palliative Care      DC Planning Services  CM consult      Choice offered to / List presented to:          [facility] arranged  HH-1 RN  HH-10 DISEASE MANAGEMENT      HH agency  [facility]   Status of service:  Completed, signed off Medicare Important Message given?   (If response is "NO", the following Medicare IM given date fields will be blank) Date Medicare IM given:   Date Additional Medicare IM given:    Discharge Disposition:  HOME W HOME HEALTH SERVICES  Per UR Regulation:  Reviewed for med. necessity/level of care/duration of stay  If discussed at Long Length of Stay Meetings, dates discussed:    Comments:  07-25-12 [address] Tomi Bamberger, RN,BSN [phone] CM did call the daughter Riana Tessmer and she wants [facility] services with [facility] for [facility]. Pt has BSC at home. Pt's daughter states no DME needs at this time. CM did call and make referral for services with [facility] SOC to begin within 24-48 hours post d/c. Will need an order for [facility] for disease management. Medicaid will not cover PT unsless she has a qualifying a dx. MD stated he will assess to see if pt will need 02 for home. Sats did drop to 88% overnight. Pt's daughter will like for pt to return home via ambulance. CSW was made aware of this. CM will asses the need for 02 as well.   5/14 0931 debbie dowell rn,bsn pal care margie spoke w fam this am. Lynnda Child to meet w fam at 12n. not sure if pt will go home w hospice or hhc. await  fam and pal care decision. spoke w granda to let her know  different cm will assist on 3w. da works in inform system at [employer]

## 2012-07-24 NOTE — Progress Notes (Addendum)
FMTS Attending Daily Note:  Renold Don MD  [phone] pager  Family Practice pager:  [phone] I have seen and examined this patient and have reviewed their chart. I have discussed this patient with the resident. I agree with the resident's findings, assessment and care plan.  Additionally:  Patient fairly uncomfortable appearing, c/o back pain through daughter as Nurse, learning disability.  Have spoken with Palliative Care.  Plan is to watch patient on long-acting narcotics with breakthrough overnight.  If does well, can DC home tomorrow.    Pain seems most likely secondary to compression fractures.  Question of how much hydronephrosis is continuing as pain is bilateral but appears worse on Left hand side.  Otherwise I don't have a good reason for why she has such a degree of LUQ tenderness that she is writhing in bed.  KUB yesterday showed on my review of the images moderate constipation. Stool regimen started.  Will add daily stool softener to Senna, which is stimulant laxative.  Abdominal ultrasound to evaluate for source of pain -- not likely that she would qualify for any definitive treatment due to comorbidities, but may help with discussion with family regarding ultimate goals of care.    K+ increasing and with bump in creatinine.  Lasix x 1 with repeat BMET this evening.  Will likely need oxygen at DC.    Tobey Grim, MD 07/24/2012 3:05 PM

## 2012-07-24 NOTE — Progress Notes (Signed)
Family Medicine Teaching Service Daily Progress Note Service Page: [phone]  Subjective: Patient reports left upper rib pain (mid axillary line just below the axilla) No back pain or chest pain this am.  Objective: Temp:  [97.3 F (36.3 C)-98.1 F (36.7 C)] 97.5 F (36.4 C) (05/14 0300) Pulse Rate:  [66-127] 72 (05/14 0700) Resp:  [15-42] 16 (05/14 0700) BP: (105-184)/(37-85) 139/80 mmHg (05/14 0700) SpO2:  [91 %-100 %] 97 % (05/14 0700) Weight:  [114 lb 6.7 oz (51.9 kg)] 114 lb 6.7 oz (51.9 kg) (05/14 0500)  Intake/Output Summary (Last 24 hours) at 07/24/12 0711 Last data filed at 07/24/12 0400  Gross per 24 hour  Intake 582.59 ml  Output     50 ml  Net 532.59 ml   Exam:  General: chronically ill appearing elderly lady. NAD.  Cardiovascular: RRR. No murmurs, rubs, or gallops.  Respiratory: CTAB. No rales, rhonchi or wheezing.  Abdomen: soft, nontender, nondistended.  Extremities: No LE edema.  MSK: No back pain or tenderness to palpation this am.  CBC BMET   Recent Labs Lab 07/21/12 2116 07/21/12 2308 07/22/12 0510  WBC 6.1 6.5 6.4  HGB 12.6 13.9 12.9  HCT 38.4 41.4 39.4  PLT 272 255 271    Recent Labs Lab 07/21/12 2116 07/21/12 2308 07/22/12 0510  NA 134* 134* 135  K 4.6 4.7 4.5  CL 100 100 104  CO2 26 24 24   BUN 27* 25* 22  CREATININE 1.16* 1.08 0.97  GLUCOSE 98 120* 101*  CALCIUM 9.3 9.3 8.9     Imaging/Diagnostic Tests: Dg Chest 2 View 07/18/2012   *RADIOLOGY REPORT*  Clinical Data: Shortness of breath, chest pain  CHEST - 2 VIEW  Comparison: 04/14/2011  Findings: Cardiomegaly again noted.  Central vascular congestion without convincing pulmonary edema.  Again noted prominent fat pad bilateral cardiophrenic angle.  Left basilar atelectasis.  No segmental infiltrate.  There is moderate compression fracture mid thoracic spine of indeterminate age.  Clinical correlation is necessary.  IMPRESSION: Cardiomegaly.  Central vascular congestion without  convincing pulmonary edema.  Left basilar atelectasis.  No segmental infiltrate.  Moderate compression deformity mid thoracic spine of indeterminate age.  Clinical correlation is necessary.  US Renal 07/23/2012   *RADIOLOGY REPORT*  Clinical Data: Hypertension  RENAL/URINARY TRACT ULTRASOUND COMPLETE  Comparison:  04/16/2011  Findings:  Right Kidney:  10.1 cm in length.  Normal cortical echogenicity. No hydronephrosis or mass.  Left Kidney:  Moderate left hydronephrosis with severe cortical thinning.  Overall, hydronephrosis has improved.  Bladder:  Right ureteral jet is identified.  The left ureteral jet was not identified.  IMPRESSION: Normal appearance of the right kidney.  Chronic left ureteropelvic junction obstruction with severe atrophy of the left kidney.   Dg Chest Portable 1 View 07/21/2012   *RADIOLOGY REPORT*  Clinical Data: Codes STEMI  PORTABLE CHEST - 1 VIEW  Comparison: 07/18/2012  Findings: Prominent cardiomediastinal contours.  Diffuse increased interstitial markings.  Central vascular congestion.  Small effusions suggested. Chronic lingular/left lung base opacity.  Mild increased opacity right lung base.  No pneumothorax.  Osteopenia. Aortic atherosclerosis.  IMPRESSION: Prominent cardiomediastinal contours.  Interval increased interstitial markings may reflect edema.  Lung base opacities are nonspecific and favored to reflect a combination of scarring and atelectasis.  Small effusions suggested.    Dg Abd Portable 1v 07/23/2012   *RADIOLOGY REPORT*  Clinical Data: Abdominal distention.  Left upper quadrant pain  PORTABLE ABDOMEN - 1 VIEW  Comparison:  None.  Findings: The  bowel gas pattern is normal.  No radio-opaque calculi or other significant radiographic abnormality is seen. Osteopenia related to chronic renal disease, with old L2 compression deformity.  IMPRESSION: No acute findings.  Assessment/Plan: 79 y.o. female admitted for chest pain r/o. We are called to consult regarding her  pyuria and set outpatient follow up for her chronic pain management.   # UTI, complicated- Pyuria and frequency with urination on admission. Afebrile and normal WBC. Pt was treated with Keflex 07/02/12 and Urine culture at that time did not grew specific bacteria.  - Urine culture collected 5/12; >100,000 CFU Gram Neg. Rods  - Cipro changed to Levaquin for 1 dose (after speaking with urology, patient needs just 3 days of treatment)  - Will D/C antibiotics today.  # Hydronephrosis - US obtained and revealed chronic left ureteropelvic junction obstruction with severe atrophy of the left kidney.  - Spoke with Urologist (Dr. Mena Goes). He consulted 1 year ago and reports that there is nothing to do regarding hydronephrosis especially give age and co-morbidities.  - He did, however, recommended only a short 3 day course of antibiotics.   # Chronic pain - Will continue schedule tylenol.  Oxycodone IR [id number].  IV Morphine now discontinued.   # Chest pain  - Troponin's have been negative x 4.  - Deemed noncardiac per Cardiology - Cardiology signed off.  # Chronic Diastolic CHF  - No signs of exacerbation at this time. - Patient will poor PO intake, so will hold Torsemide  # Sick Sinus Sydrome  - Will restart Pindolol today  # HTN  - Will continue Hydralazine 25 mg QID.  # CKD  - Stable.  FEN/GI: Heart healthy diet. KVO fluids.  PPx: Heparin  Dispo: Patient now medically stable, but pain management is an issue.  Discussed option for palliative/hospice consult and daughter agreed. Palliative care consulted today.  Patient could possible be discharged home today if everything is in place for her to do so. Code Status: Full Code.   Everlene Other, DO 07/24/2012, 7:10 AM

## 2012-07-24 NOTE — Consult Note (Signed)
Patient ZO:XWRU [name]      DOB: 09-17-1916      EAV:[id number]     Consult Note from the Palliative Medicine Team at [facility]    Consult Requested by: Dr Yvette [name]    PCP: Yvette Homer, MD Reason for Consultation:Calrification of GOC and options     Phone Number:[phone]  Assessment of patients Current state:79 yo female with PMH CAD, CHF, HTN, SSS, admitted thru ED with c/o chest pain.  CT positive for hydronephrosis with severe  atrophy  of left kidney with associated pain, UTI.  Overall failure to thrive, weakness, poor po intake.  Family hopeful to treat pain and take her home to be cared for by family, no desire for hospice at this time.   Consult is for review of medical treatment options, clarification of goals of care and end of life issues, disposition and options, and symptom recommendation.  This NP Lorinda Creed reviewed medical records, received report from team, assessed the patient and then meet at the patient's bedside along with her daughter Yvette [name]  # [phone]  to discuss diagnosis prognosis, GOC, EOL wishes disposition and options.   A detailed discussion was had today regarding advanced directives.  Concepts specific to code status, artifical feeding and hydration, continued IV antibiotics and rehospitalization was had.  The difference between a aggressive medical intervention path  and a palliative comfort care path for this patient at this time was had.  Values and goals of care important to patient and family were attempted to be elicited.  Concept of Hospice and Palliative Care were discussed  Natural trajectory and expectations at EOL were discussed.  Questions and concerns addressed.  Hard Choices booklet left for review. Family encouraged to call with questions or concerns.  PMT will continue to support holistically.    Goals of Care: 1.  Code Status: Full code--daughter reports that it is in their culture that she remains a full code, otherwise it would seem  like "we were throwing her away"   2. Scope of Treatment: 1.  Goal of care is to get patient home, (family does not want hospice at this time), treat her pain with "sdtrong medicine" (they don't want her to suffer, and then they will make future care decisions as they arise    4. Disposition  Home to family caregivers, transported by ambulance   3. Symptom Management:   1. Pain:LUQ  Oxycodone 10 mg ER every 12 hrs scheduled/ Oxycodone 5 mg every 4 hrs prn 2. Bowel Regimen: Dulcolax supp prn  4. Psychosocial:  Emotional support offered to daughter, expressed understanding and support of family decision, appreciate cultural differences.      Brief HPI: 79 yo female with PMH CAD, CHF, HTN, SSS, admitted thru ED with c/o chest pain.  CT positive for hydronephrosis with severe  atrophy  of left kidney with associated pain, UTI.  Overall failure to thrive, poor po intake.  Family hopeful to treat pain and take her home to be cared for by family, no desire for hospice at this time.    ROS: difficult to illicit due to communication barrieor, daughter reports for patient severe pain left upper quadrant    PMH:  Past Medical History  Diagnosis Date  . Paroxysmal atrial flutter [facility] admission 2/5-04/20/2010    High fall risk-not a Coumadin candidate  . Hypertension   . Chronic diastolic heart failure     a. Echo February 2012: EF 55%; moderate LVH; mild AI; mild MR; PASP  39  . Sick sinus syndrome     a. Limits use of beta blocker-pindolol tolerated  . NSTEMI (non-ST elevated myocardial infarction)     a. Troponin 0.12, 0.13, 0.08 during admission 04/2010 - conservative mgmt.  . Arthritis   . Anxiety   . Hydronephrosis     Chronic  . CHF (congestive heart failure)   . Chronic back pain      PSH: Past Surgical History  Procedure Laterality Date  . Esophagogastroduodenoscopy  04/19/2011    Procedure: ESOPHAGOGASTRODUODENOSCOPY (EGD);  Surgeon: Erick Blinks, MD;  Location: [facility] ENDOSCOPY;   Service: Gastroenterology;  Laterality: N/A;   I have reviewed the FH and SH and  If appropriate update it with new information. No Known Allergies Scheduled Meds: . acetaminophen  650 mg Oral Q6H  . aspirin EC  81 mg Oral Daily  . heparin  5,000 Units Subcutaneous Q8H  . hydrALAZINE  50 mg Oral Q6H  . pantoprazole  40 mg Oral Daily  . pindolol  2.5 mg Oral TID  . senna  2 tablet Oral Daily  . sodium chloride  3 mL Intravenous Q12H   Continuous Infusions:  PRN Meds:.sodium chloride, nitroGLYCERIN, ondansetron (ZOFRAN) IV, oxyCODONE, sodium chloride    BP 166/75  Pulse 86  Temp(Src) 98.4 F (36.9 C) (Oral)  Resp 20  Ht 4\' 9"  (1.448 m)  Wt 51.9 kg (114 lb 6.7 oz)  BMI 24.75 kg/m2  SpO2 94%   PPS:30 %   Intake/Output Summary (Last 24 hours) at 07/24/12 1236 Last data filed at 07/24/12 0900  Gross per 24 hour  Intake  525.8 ml  Output    150 ml  Net  375.8 ml    Physical Exam:  General: chronically ill appearing, moaning and touching LUQ below rib HEENT: mm, no exudate Chest:   Diminished in bases, CTA CVS: RRR Abdomen:soft +BS tenderness in LUQ area Ext: without edema Neuro:oriented to person per daughter's interpretation  Labs: CBC    Component Value Date/Time   WBC 6.4 07/22/2012 0510   RBC 4.29 07/22/2012 0510   HGB 12.9 07/22/2012 0510   HCT 39.4 07/22/2012 0510   PLT 271 07/22/2012 0510   MCV 91.8 07/22/2012 0510   MCH 30.1 07/22/2012 0510   MCHC 32.7 07/22/2012 0510   RDW 12.3 07/22/2012 0510   LYMPHSABS 1.9 07/21/2012 2308   MONOABS 0.3 07/21/2012 2308   EOSABS 0.2 07/21/2012 2308   BASOSABS 0.0 07/21/2012 2308    BMET    Component Value Date/Time   NA 136 07/24/2012 1122   K 6.0* 07/24/2012 1122   CL 103 07/24/2012 1122   CO2 24 07/24/2012 1122   GLUCOSE 110* 07/24/2012 1122   BUN 32* 07/24/2012 1122   CREATININE 1.40* 07/24/2012 1122   CREATININE 1.17* 07/15/2012 1613   CALCIUM 9.3 07/24/2012 1122   GFRNONAA 31* 07/24/2012 1122   GFRAA 36* 07/24/2012  1122    CMP     Component Value Date/Time   NA 136 07/24/2012 1122   K 6.0* 07/24/2012 1122   CL 103 07/24/2012 1122   CO2 24 07/24/2012 1122   GLUCOSE 110* 07/24/2012 1122   BUN 32* 07/24/2012 1122   CREATININE 1.40* 07/24/2012 1122   CREATININE 1.17* 07/15/2012 1613   CALCIUM 9.3 07/24/2012 1122   PROT 7.8 07/21/2012 2308   ALBUMIN 3.6 07/21/2012 2308   AST 23 07/21/2012 2308   ALT 16 07/21/2012 2308   ALKPHOS 81 07/21/2012 2308   BILITOT 0.4 07/21/2012 2308  GFRNONAA 31* 07/24/2012 1122   GFRAA 36* 07/24/2012 1122     Time In Time Out Total Time Spent with Patient Total Overall Time  1200 1330 75 min 90 min    Greater Scharlene 50%  of this time was spent counseling and coordinating care related to the above assessment and plan.  Discussed with Dr Gary Fleet and Dr  Birdie Sons  Lorinda Creed NP  Palliative Medicine Team Team Phone # [phone] Pager [phone]

## 2012-07-25 ENCOUNTER — Other Ambulatory Visit: Payer: Self-pay | Admitting: Cardiology

## 2012-07-25 DIAGNOSIS — I2510 Atherosclerotic heart disease of native coronary artery without angina pectoris: Secondary | ICD-10-CM

## 2012-07-25 DIAGNOSIS — N390 Urinary tract infection, site not specified: Secondary | ICD-10-CM

## 2012-07-25 DIAGNOSIS — M549 Dorsalgia, unspecified: Secondary | ICD-10-CM

## 2012-07-25 DIAGNOSIS — R339 Retention of urine, unspecified: Secondary | ICD-10-CM

## 2012-07-25 DIAGNOSIS — N1330 Unspecified hydronephrosis: Secondary | ICD-10-CM

## 2012-07-25 LAB — BASIC METABOLIC PANEL
Chloride: 100 mEq/L (ref 96–112)
GFR calc Af Amer: 32 mL/min — ABNORMAL LOW (ref >90)
GFR calc non Af Amer: 28 mL/min — ABNORMAL LOW (ref >90)
Potassium: 4.5 mEq/L (ref 3.5–5.1)
Sodium: 136 mEq/L (ref 135–145)

## 2012-07-25 LAB — CBC
HCT: 42.6 % (ref 36.0–46.0)
HCT: 42.1 % (ref 36.0–46.0)
Hemoglobin: 13.6 g/dL (ref 12.0–15.0)
MCHC: 32.4 g/dL (ref 30.0–36.0)
MCHC: 32.3 g/dL (ref 30.0–36.0)
Platelets: 241 10*3/uL (ref 150–400)
RDW: 12.9 % (ref 11.5–15.5)
WBC: 11.7 10*3/uL — ABNORMAL HIGH (ref 4.0–10.5)
WBC: 7.9 10*3/uL (ref 4.0–10.5)

## 2012-07-25 LAB — URINE CULTURE

## 2012-07-25 MED ORDER — TRIMETHOPRIM 100 MG PO TABS
50.0000 mg | ORAL_TABLET | Freq: Every day | ORAL | Status: DC
  Administered 2012-07-25: 50 mg via ORAL
  Filled 2012-07-25: qty 1

## 2012-07-25 MED ORDER — SODIUM CHLORIDE 0.9 % IV SOLN: 250.0000 mL | INTRAVENOUS | Status: DC | PRN

## 2012-07-25 MED ORDER — NITROGLYCERIN 0.4 MG SL SUBL: 0.4000 mg | SUBLINGUAL_TABLET | SUBLINGUAL | Status: DC | PRN

## 2012-07-25 MED ORDER — BISACODYL 10 MG RE SUPP: 10.0000 mg | Freq: Every day | RECTAL | Status: DC | PRN

## 2012-07-25 MED ORDER — HYDRALAZINE HCL 50 MG PO TABS: 50.0000 mg | ORAL_TABLET | Freq: Four times a day (QID) | ORAL | Status: DC

## 2012-07-25 MED ORDER — OXYCODONE HCL 5 MG PO TABS
5.0000 mg | ORAL_TABLET | ORAL | Status: DC | PRN
  Administered 2012-07-25: 5 mg via ORAL
  Filled 2012-07-25: qty 1

## 2012-07-25 MED ORDER — SENNA 8.6 MG PO TABS: 2.0000 | ORAL_TABLET | Freq: Every day | ORAL | Status: DC

## 2012-07-25 MED ORDER — MORPHINE SULFATE 2 MG/ML IJ SOLN: 1.0000 mg | INTRAMUSCULAR | Status: DC | PRN

## 2012-07-25 MED ORDER — OXYCODONE HCL ER 10 MG PO T12A: 10.0000 mg | EXTENDED_RELEASE_TABLET | Freq: Two times a day (BID) | ORAL | Status: DC

## 2012-07-25 MED ORDER — OXYCODONE HCL 5 MG PO TABS: 5.0000 mg | ORAL_TABLET | ORAL | Status: DC | PRN

## 2012-07-25 MED ORDER — TRIMETHOPRIM 100 MG PO TABS: 50.0000 mg | ORAL_TABLET | Freq: Every day | ORAL | Status: DC

## 2012-07-25 NOTE — Progress Notes (Signed)
FMTS Attending Daily Note:  Renold Don MD  [phone] pager  Family Practice pager:  [phone] I have seen and examined this patient and have reviewed their chart. I have discussed this patient with the resident. I agree with the resident's findings, assessment and care plan.  Additionally:  Ultrasounds have been negative thus far.  Abdominal ultrasound yesterday revealed new Right hyrdonephrosis.  Patient with evidence of urinary retention, relieved with Foley.  Also finally had stool.  Abdominal pain much improved after this plus change in pain medications.  As she has improved, plan to DC home with FOley and outpatient urology FU.  She should be able to qualify for home O2.  FU with [place] in outpatient setting.   Tobey Grim, MD 07/25/2012 3:44 PM

## 2012-07-25 NOTE — Discharge Summary (Signed)
Family Medicine Teaching [facility] Discharge Summary  Patient name: Yvette [name] Medical record number: [id number] Date of birth: 1916-08-22 Age: 79 y.o. Gender: female Date of Admission: 07/21/2012  Date of Discharge: 07/25/12 Admitting Physician: Kathleene Hazel, MD  Primary Care Provider: Majel Homer, MD  Indication for Hospitalization: Chest pain  Discharge Diagnoses:  Principal Problem:   Chest pain Active Problems:   Chronic diastolic heart failure   Sick sinus syndrome   CKD (chronic kidney disease), stage III   HTN (hypertension)   Abdominal pain, left upper quadrant   UTI (urinary tract infection)   Hydronephrosis   CAD (coronary artery disease)   Back pain   Urinary retention  Brief Hospital Course:  79 year old female with history of CAD (prior NSTEMI), CKD, HTN, diastolic CHF and SSS presented with chest pain.  Cardiology initially managed patient and then care was transferred to our service.  1) Chest pain; CAD - EKG was unchanged.  - Patient was continued on home medications  - Patient treated with Aspirin and morphine. - Troponins were negative x 4.  Pain appeared to be non cardiac in origin (see below) - Cardiology then signed off and we took over her care.  2) UTI - Pyuria and urinary frequency on admission. - Urine culture obtained and patient was started on Ciprofloxacin. This was subsequently changed to Levaquin prior to culture results. - Urine culture - >100,000 CFU E coli (resistant to only ampicillin) - Given presence of hydronephrosis (see below) case was discussed with Dr. Mena Goes (urology). He recommended 3 day course of antibiotics then discontinuation. Patient completed 3 day antibiotic course. - However, on 5/14 patient reported dysuria.  UA revealed moderate leukocytes. Additional urine culture was obtained. - No further antibiotic treatment was started for UTI as patient has hydronephrosis and is likely colonized.  Patient was placed  on prophylactic Trimethoprim per urology recommendations.  Patient will need outpatient follow with urology regarding this.  3) Hydronephrosis - Patient has a history of hydronephrosis of the left kidney  - In the setting of UTI and abdominal pain (see below), renal ultrasound was obtained. It revealed chronic left ureteropelvic junction obstruction with severe atrophy of the left kidney. This was discussed with urology, who stated that no intervention was needed at this time.  - On 5/14 patient had increasing abdominal pain.  Abdominal ultrasound was obtained and revealed mild right hydronephrosis of the right kidney.  Patient was retaining urine at that time.   This was also discussed with Dr. Mena Goes who recommended follow up ultrasound.  This was not obtained during admission, as family was eager to take her home.  This needs to be addressed in the outpatient setting.  4) Urinary retention - On 5/14, patient found to have bladder distension.  Bladder scan revealed 500 mL.  Foley catheter was placed with 600 mL of urine output.  Foley catheter was left in place and patient was discharged home with catheter per Dr. Mena Goes.  Patient needs to follow up with him in 1-2 weeks.  5) Abdominal pain, Back pain - Patient had significant abdominal pain and back pain during admission. - Work up was unremarkable except for hydronephrosis. - Prior chest xray and abdominal film during admission revealed evidence of prior thoracic and lumbar compression fracture which may have been causing a significant amount of pain for patient. - Given age and co-morbidities, palliative care was consulted.  After speaking with the family, it was determined that the patient was to be  discharged home with Oxycontin and Oxycodone for pain control. - Patient's pain was well controlled at time of discharge.  Patient was discharged home on Oxycontin 10 mg BID and Oxycodone IR 5 mg Q4 hours as needed.  6) Chronic Diastolic CHF   - No signs of exacerbation during admission - Torsemide held during admission.  7) Sick Sinus Syndrome - Home continued during admission  8) HTN - Hydralazine increased to 50 mg QID during admission  Significant Labs and Imaging:   CBC BMET   Recent Labs Lab 07/22/12 0510 07/25/12 0010 07/25/12 0837  WBC 6.4 11.7* 7.9  HGB 12.9 13.8 13.6  HCT 39.4 42.6 42.1  PLT 271 241 296    Recent Labs Lab 07/24/12 1122 07/24/12 1927 07/25/12 0837  NA 136 136 136  K 6.0* 4.5 4.5  CL 103 101 100  CO2 24 24 20   BUN 32* 28* 33*  CREATININE 1.40* 1.38* 1.54*  GLUCOSE 110* 125* 174*  CALCIUM 9.3 9.8 9.9     Troponin negative x 4  Dg Chest 2 View  07/18/2012 *RADIOLOGY REPORT* Clinical Data: Shortness of breath, chest pain CHEST - 2 VIEW Comparison: 04/14/2011 Findings: Cardiomegaly again noted. Central vascular congestion without convincing pulmonary edema. Again noted prominent fat pad bilateral cardiophrenic angle. Left basilar atelectasis. No segmental infiltrate. There is moderate compression fracture mid thoracic spine of indeterminate age. Clinical correlation is necessary. IMPRESSION: Cardiomegaly. Central vascular congestion without convincing pulmonary edema. Left basilar atelectasis. No segmental infiltrate. Moderate compression deformity mid thoracic spine of indeterminate age. Clinical correlation is necessary.   US Abdomen Complete  07/24/2012 *RADIOLOGY REPORT* Clinical Data: Left upper quadrant pain. COMPLETE ABDOMINAL ULTRASOUND Comparison: 07/23/2012 Findings: Gallbladder: No stones or wall thickening. Negative sonographic Murphy's. Common bile duct: Normal caliber, 3 mm. Liver: No focal lesion identified. Within normal limits in parenchymal echogenicity. IVC: Appears normal. Pancreas: No focal abnormality seen. Spleen: Within normal limits in size and echotexture. Right Kidney: Normal in size and parenchymal echogenicity. No evidence of mass.Mild fullness of the right renal  collecting system/hydronephrosis, new since prior study. Left Kidney: Severely atrophic, 5.8 cm. And decreasing hydronephrosis since prior study. 4.1 cm cyst in the left kidney. Abdominal aorta: No aneurysm identified. IMPRESSION: Decreasing left hydronephrosis. Mild right hydronephrosis, new since prior study.   US Renal  07/23/2012 *RADIOLOGY REPORT* Clinical Data: Hypertension RENAL/URINARY TRACT ULTRASOUND COMPLETE Comparison: 04/16/2011 Findings: Right Kidney: 10.1 cm in length. Normal cortical echogenicity. No hydronephrosis or mass. Left Kidney: Moderate left hydronephrosis with severe cortical thinning. Overall, hydronephrosis has improved. Bladder: Right ureteral jet is identified. The left ureteral jet was not identified. IMPRESSION: Normal appearance of the right kidney. Chronic left ureteropelvic junction obstruction with severe atrophy of the left kidney.   Dg Chest Portable 1 View  07/21/2012 *RADIOLOGY REPORT* Clinical Data: Codes STEMI PORTABLE CHEST - 1 VIEW Comparison: 07/18/2012 Findings: Prominent cardiomediastinal contours. Diffuse increased interstitial markings. Central vascular congestion. Small effusions suggested. Chronic lingular/left lung base opacity. Mild increased opacity right lung base. No pneumothorax. Osteopenia. Aortic atherosclerosis. IMPRESSION: Prominent cardiomediastinal contours. Interval increased interstitial markings may reflect edema. Lung base opacities are nonspecific and favored to reflect a combination of scarring and atelectasis. Small effusions suggested.   Dg Abd Portable 1v  07/23/2012 *RADIOLOGY REPORT* Clinical Data: Abdominal distention. Left upper quadrant pain PORTABLE ABDOMEN - 1 VIEW Comparison: None. Findings: The bowel gas pattern is normal. No radio-opaque calculi or other significant radiographic abnormality is seen. Osteopenia related to chronic renal disease, with old L2  compression deformity. IMPRESSION: No acute findings.   Procedures:  None  Consultations: Urology, Cardiology  Discharge Medications:    Medication List    STOP taking these medications       aspirin 81 MG chewable tablet     traMADol 50 MG tablet  Commonly known as:  ULTRAM      TAKE these medications       aspirin EC 81 MG tablet  Take 81 mg by mouth daily.     bisacodyl 10 MG suppository  Commonly known as:  DULCOLAX  Place 1 suppository (10 mg total) rectally daily as needed.     FISH OIL PO  Take 1 capsule by mouth daily.     hydrALAZINE 50 MG tablet  Commonly known as:  APRESOLINE  Take 1 tablet (50 mg total) by mouth 4 (four) times daily.     Lysine 500 MG Caps  Take 500 mg by mouth daily.     Melatonin 5 MG Tabs  Take 10 mg by mouth at bedtime.     multivitamin tablet  Take 1 tablet by mouth daily.     nitroGLYCERIN 0.4 MG SL tablet  Commonly known as:  NITROSTAT  Place 1 tablet (0.4 mg total) under the tongue every 5 (five) minutes x 3 doses as needed for chest pain.     oxyCODONE 5 MG immediate release tablet  Commonly known as:  Oxy IR/ROXICODONE  Take 1 tablet (5 mg total) by mouth every 4 (four) hours as needed.     OxyCODONE 10 mg T12a  Commonly known as:  OXYCONTIN  Take 1 tablet (10 mg total) by mouth every 12 (twelve) hours.     pantoprazole 40 MG tablet  Commonly known as:  PROTONIX  Take 40 mg by mouth daily.     pindolol 5 MG tablet  Commonly known as:  VISKEN  Take 2.5-5 mg by mouth 2 (two) times daily. Take 5mg  in the am and 2.5mg  in the pm     senna 8.6 MG Tabs  Commonly known as:  SENOKOT  Take 2 tablets (17.2 mg total) by mouth daily.     torsemide 20 MG tablet  Commonly known as:  DEMADEX  Take 10 mg by mouth 3 (three) times a week. Takes on Tues, Thurs & Sat     trimethoprim 100 MG tablet  Commonly known as:  TRIMPEX  Take 0.5 tablets (50 mg total) by mouth daily.       Issues for Follow Up:  1) Patient needs follow up BMP to assess creatinine 2) Follow up pain control 3) Follow  up BP 4) Follow up Urine culture from 5/14  Outstanding Results: Urine culture results (5/14)  Discharge Instructions:  Patient was counseled important signs and symptoms that should prompt return to medical care, changes in medications, dietary instructions, activity restrictions, and follow up appointments.   Follow-up Information   Follow up with Simone Curia, MD On 07/26/2012. (1:30 pm)    Contact information:   [address] [place] [address] [phone]       Schedule an appointment as soon as possible for a visit with Antony Haste, MD.   Contact information:   [address] [place] [place] [address] [phone]       Discharge Condition: Medically stable. Discharged home.  Everlene Other, DO 07/25/2012, 6:57 PM
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
## 2012-07-25 NOTE — Consult Note (Signed)
WOC consult Note Reason for Consult:  Wound to groin/buttock Wound type: Denuded from scratching. Pt was noted to be scrathing area frequently with long fingernails and has resulted in small amt bleeding. Fissures in groin due to moisture. Pressure Ulcer POA: Not a pressure ulcer Measurement: 0.2cm X 0.2cm open area in R groin Wound bed: Red Drainage (amount, consistency, odor): No drainage or odor from groin fissures.  Small amt of bloody drainage noted from scratch marks on buttock Periwound: intact Dressing procedure/placement/frequency: Apply barrier cream with cleaning or prn to groin folds and to bilat buttock to protect skin from moisture. Family at bedside to discuss plan of care and one member speaks [place].  They desire to take pt home with comfort care goals. Norva Karvonen RN, MSN Student Please re-consult if further assistance is needed.  Thank-you,  Cammie Mcgee MSN, RN, CWOCN, [place], CNS [phone]

## 2012-07-25 NOTE — Progress Notes (Signed)
Family Medicine Teaching Service Daily Progress Note Service Page: [phone]  Subjective: Overnight events:  - Desaturation to 88% yesterday evening.  Pain regimen increased.  - Patient continuing to have pain.  Localized to abdomen.  US obtained and pain regimen increased (D/C Oxy IR and started IV Morphine) - Additional episode of abdominal pain at ~11 pm.  Patient also reported dysuria. Bladder was found to be distended. Bladder scanned and revealed 500 mL.  Foley placed with return of 600 mL of urine.  UA, gram stain and culture sent.  Abdominal ultrasound later returned revealing new right hydronephrosis.  Family at bedside.  Patient appears in mild distress but endorses no pain this am.  Objective: Temp:  [97.4 F (36.3 C)-98.4 F (36.9 C)] 98.4 F (36.9 C) (05/15 0500) Pulse Rate:  [72-92] 81 (05/15 0500) Resp:  [16-22] 20 (05/15 0500) BP: (118-166)/(56-85) 120/73 mmHg (05/15 0500) SpO2:  [88 %-97 %] 97 % (05/15 0500) FiO2 (%):  [95 %] 95 % (05/15 0459) Weight:  [107 lb 12.9 oz (48.9 kg)] 107 lb 12.9 oz (48.9 kg) (05/15 0500)  Intake/Output Summary (Last 24 hours) at 07/25/12 0659 Last data filed at 07/25/12 0558  Gross per 24 hour  Intake     60 ml  Output   1100 ml  Net  -1040 ml   Exam:  General: chronically ill appearing elderly lady. NAD.  Cardiovascular: RRR. No murmurs, rubs, or gallops.  Respiratory: CTAB. No rales, rhonchi or wheezing.  Abdomen: soft, nontender, nondistended.  Extremities: No LE edema.  MSK: No back pain or tenderness to palpation this am.  CBC BMET   Recent Labs Lab 07/21/12 2308 07/22/12 0510 07/25/12 0010  WBC 6.5 6.4 11.7*  HGB 13.9 12.9 13.8  HCT 41.4 39.4 42.6  PLT 255 271 241    Recent Labs Lab 07/22/12 0510 07/24/12 1122 07/24/12 1927  NA 135 136 136  K 4.5 6.0* 4.5  CL 104 103 101  CO2 24 24 24   BUN 22 32* 28*  CREATININE 0.97 1.40* 1.38*  GLUCOSE 101* 110* 125*  CALCIUM 8.9 9.3 9.8     Imaging/Diagnostic  Tests:  Dg Chest 2 View 07/18/2012   *RADIOLOGY REPORT*  Clinical Data: Shortness of breath, chest pain  CHEST - 2 VIEW  Comparison: 04/14/2011  Findings: Cardiomegaly again noted.  Central vascular congestion without convincing pulmonary edema.  Again noted prominent fat pad bilateral cardiophrenic angle.  Left basilar atelectasis.  No segmental infiltrate.  There is moderate compression fracture mid thoracic spine of indeterminate age.  Clinical correlation is necessary.  IMPRESSION: Cardiomegaly.  Central vascular congestion without convincing pulmonary edema.  Left basilar atelectasis.  No segmental infiltrate.  Moderate compression deformity mid thoracic spine of indeterminate age.  Clinical correlation is necessary.   US Abdomen Complete 07/24/2012   *RADIOLOGY REPORT*  Clinical Data:  Left upper quadrant pain.  COMPLETE ABDOMINAL ULTRASOUND  Comparison:  07/23/2012  Findings:  Gallbladder:  No stones or wall thickening.  Negative sonographic Murphy's.  Common bile duct:   Normal caliber, 3 mm.  Liver:  No focal lesion identified.  Within normal limits in parenchymal echogenicity.  IVC:  Appears normal.  Pancreas:  No focal abnormality seen.  Spleen:  Within normal limits in size and echotexture.  Right Kidney:   Normal in size and parenchymal echogenicity.  No evidence of mass.Mild fullness of the right renal collecting system/hydronephrosis, new since prior study.  Left Kidney:  Severely atrophic, 5.8 cm.  And decreasing hydronephrosis  since prior study.  4.1 cm cyst in the left kidney.  Abdominal aorta:  No aneurysm identified.  IMPRESSION: Decreasing left hydronephrosis.  Mild right hydronephrosis, new since prior study.   US Renal 07/23/2012   *RADIOLOGY REPORT*  Clinical Data: Hypertension  RENAL/URINARY TRACT ULTRASOUND COMPLETE  Comparison:  04/16/2011  Findings:  Right Kidney:  10.1 cm in length.  Normal cortical echogenicity. No hydronephrosis or mass.  Left Kidney:  Moderate left hydronephrosis  with severe cortical thinning.  Overall, hydronephrosis has improved.  Bladder:  Right ureteral jet is identified.  The left ureteral jet was not identified.  IMPRESSION: Normal appearance of the right kidney.  Chronic left ureteropelvic junction obstruction with severe atrophy of the left kidney.   Dg Chest Portable 1 View 07/21/2012   *RADIOLOGY REPORT*  Clinical Data: Codes STEMI  PORTABLE CHEST - 1 VIEW  Comparison: 07/18/2012  Findings: Prominent cardiomediastinal contours.  Diffuse increased interstitial markings.  Central vascular congestion.  Small effusions suggested. Chronic lingular/left lung base opacity.  Mild increased opacity right lung base.  No pneumothorax.  Osteopenia. Aortic atherosclerosis.  IMPRESSION: Prominent cardiomediastinal contours.  Interval increased interstitial markings may reflect edema.  Lung base opacities are nonspecific and favored to reflect a combination of scarring and atelectasis.  Small effusions suggested.     Dg Abd Portable 1v 07/23/2012   *RADIOLOGY REPORT*  Clinical Data: Abdominal distention.  Left upper quadrant pain  PORTABLE ABDOMEN - 1 VIEW  Comparison:  None.  Findings: The bowel gas pattern is normal.  No radio-opaque calculi or other significant radiographic abnormality is seen. Osteopenia related to chronic renal disease, with old L2 compression deformity.  IMPRESSION: No acute findings.   Assessment/Plan: 79 y.o. female admitted for chest pain r/o. We are called to consult regarding her pyuria and set outpatient follow up for her chronic pain management.   # UTI, complicated- Pyuria and frequency with urination on admission. Afebrile and normal WBC. Pt was treated with Keflex 07/02/12 and Urine culture at that time did not grew specific bacteria.  - Urine culture collected 5/12; >100,000 CFU Gram Neg. Rods  - Cipro changed to Levaquin for 1 dose (after speaking with urology, patient needs just 3 days of treatment)  - Abx discontinued 5/14. - Given  dysuria and abdominal pain overnight, UA, gram stain and Urine Culture were obtained.   - Awaiting culture results at this time.   - Discussed case with Dr. Mena Goes this am. He recommend daily low dose antibiotic (Bactrim).  # Hydronephrosis - US obtained and revealed chronic left ureteropelvic junction obstruction with severe atrophy of the left kidney.  Abdominal ultrasound obtained 5/14 and revealed new right hydronephrosis. - Given abdominal pain and new right hydronephrosis and urinary retention. - Urology recommend repeating Renal US this am.  # Chronic pain - Palliative care consulted yesterday. They recommended OxyContin 100 mg ER every 12 hrs scheduled/Oxycodone 5 mg every 4 hrs prn - Due to increasing pain, Oxycodone was discontinued overnight and patient was started on IV Morphine. - Patient received 2 mg (total) of IV Morphine overnight. Last dose was at 0100.   - Will continue Oxycontin. Restarting Oxycodone Q4PRN for breakthrough pain.  # Chest pain  - Troponin's have been negative x 4.  - Deemed noncardiac per Cardiology - Cardiology signed off.  # Chronic Diastolic CHF  - No signs of exacerbation at this time. - Holding Home torsemide  # Sick Sinus Sydrome  - Continuing Pindolol  # HTN  -  Hydralazine increased to 50 mg QID.  # CKD  - Stable.  FEN/GI: Heart healthy diet. KVO fluids.  PPx: Heparin  Dispo: Pending stabilization and adequate pain control Code Status: Full Code.   Everlene Other, DO 07/25/2012, 6:59 AM

## 2012-07-26 ENCOUNTER — Ambulatory Visit (INDEPENDENT_AMBULATORY_CARE_PROVIDER_SITE_OTHER): Payer: Medicaid Other | Admitting: Family Medicine

## 2012-07-26 ENCOUNTER — Inpatient Hospital Stay (HOSPITAL_COMMUNITY)
Admission: AD | Admit: 2012-07-26 | Discharge: 2012-07-29 | DRG: 543 | Disposition: A | Discharge status: Home / Self Care | Payer: Medicaid Other | Source: Ambulatory Visit | Attending: Family Medicine | Admitting: Family Medicine

## 2012-07-26 ENCOUNTER — Inpatient Hospital Stay (HOSPITAL_COMMUNITY): Payer: Medicaid Other

## 2012-07-26 ENCOUNTER — Encounter (HOSPITAL_COMMUNITY): Payer: Self-pay | Admitting: General Practice

## 2012-07-26 VITALS — BP 151/73 | HR 89 | Temp 99.1°F

## 2012-07-26 DIAGNOSIS — N183 Chronic kidney disease, stage 3 unspecified: Secondary | ICD-10-CM | POA: Diagnosis present

## 2012-07-26 DIAGNOSIS — N184 Chronic kidney disease, stage 4 (severe): Secondary | ICD-10-CM | POA: Diagnosis present

## 2012-07-26 DIAGNOSIS — R1012 Left upper quadrant pain: Secondary | ICD-10-CM | POA: Diagnosis present

## 2012-07-26 DIAGNOSIS — B029 Zoster without complications: Secondary | ICD-10-CM

## 2012-07-26 DIAGNOSIS — R7989 Other specified abnormal findings of blood chemistry: Secondary | ICD-10-CM

## 2012-07-26 DIAGNOSIS — I2510 Atherosclerotic heart disease of native coronary artery without angina pectoris: Secondary | ICD-10-CM | POA: Diagnosis present

## 2012-07-26 DIAGNOSIS — I495 Sick sinus syndrome: Secondary | ICD-10-CM

## 2012-07-26 DIAGNOSIS — I4892 Unspecified atrial flutter: Secondary | ICD-10-CM

## 2012-07-26 DIAGNOSIS — K219 Gastro-esophageal reflux disease without esophagitis: Secondary | ICD-10-CM | POA: Diagnosis present

## 2012-07-26 DIAGNOSIS — IMO0002 Reserved for concepts with insufficient information to code with codable children: Secondary | ICD-10-CM

## 2012-07-26 DIAGNOSIS — F419 Anxiety disorder, unspecified: Secondary | ICD-10-CM | POA: Diagnosis present

## 2012-07-26 DIAGNOSIS — R109 Unspecified abdominal pain: Secondary | ICD-10-CM | POA: Diagnosis present

## 2012-07-26 DIAGNOSIS — I5032 Chronic diastolic (congestive) heart failure: Secondary | ICD-10-CM | POA: Diagnosis present

## 2012-07-26 DIAGNOSIS — Z8673 Personal history of transient ischemic attack (TIA), and cerebral infarction without residual deficits: Secondary | ICD-10-CM

## 2012-07-26 DIAGNOSIS — R531 Weakness: Secondary | ICD-10-CM

## 2012-07-26 DIAGNOSIS — N269 Renal sclerosis, unspecified: Secondary | ICD-10-CM | POA: Diagnosis present

## 2012-07-26 DIAGNOSIS — M546 Pain in thoracic spine: Secondary | ICD-10-CM

## 2012-07-26 DIAGNOSIS — I129 Hypertensive chronic kidney disease with stage 1 through stage 4 chronic kidney disease, or unspecified chronic kidney disease: Secondary | ICD-10-CM | POA: Diagnosis present

## 2012-07-26 DIAGNOSIS — M549 Dorsalgia, unspecified: Secondary | ICD-10-CM

## 2012-07-26 DIAGNOSIS — R079 Chest pain, unspecified: Secondary | ICD-10-CM

## 2012-07-26 DIAGNOSIS — M8448XA Pathological fracture, other site, initial encounter for fracture: Secondary | ICD-10-CM

## 2012-07-26 DIAGNOSIS — R0602 Shortness of breath: Secondary | ICD-10-CM

## 2012-07-26 DIAGNOSIS — Z7982 Long term (current) use of aspirin: Secondary | ICD-10-CM

## 2012-07-26 DIAGNOSIS — N135 Crossing vessel and stricture of ureter without hydronephrosis: Secondary | ICD-10-CM | POA: Diagnosis present

## 2012-07-26 DIAGNOSIS — Z515 Encounter for palliative care: Secondary | ICD-10-CM

## 2012-07-26 DIAGNOSIS — R339 Retention of urine, unspecified: Secondary | ICD-10-CM | POA: Diagnosis present

## 2012-07-26 DIAGNOSIS — K5900 Constipation, unspecified: Secondary | ICD-10-CM | POA: Diagnosis present

## 2012-07-26 DIAGNOSIS — I10 Essential (primary) hypertension: Secondary | ICD-10-CM | POA: Diagnosis present

## 2012-07-26 DIAGNOSIS — I252 Old myocardial infarction: Secondary | ICD-10-CM

## 2012-07-26 DIAGNOSIS — N1330 Unspecified hydronephrosis: Secondary | ICD-10-CM | POA: Diagnosis present

## 2012-07-26 DIAGNOSIS — R5381 Other malaise: Secondary | ICD-10-CM

## 2012-07-26 DIAGNOSIS — I5033 Acute on chronic diastolic (congestive) heart failure: Secondary | ICD-10-CM

## 2012-07-26 DIAGNOSIS — R5383 Other fatigue: Secondary | ICD-10-CM

## 2012-07-26 DIAGNOSIS — N179 Acute kidney failure, unspecified: Secondary | ICD-10-CM | POA: Diagnosis present

## 2012-07-26 DIAGNOSIS — N390 Urinary tract infection, site not specified: Secondary | ICD-10-CM | POA: Diagnosis present

## 2012-07-26 DIAGNOSIS — R52 Pain, unspecified: Secondary | ICD-10-CM

## 2012-07-26 HISTORY — DX: Cerebral infarction, unspecified: I63.9

## 2012-07-26 LAB — CBC WITH DIFFERENTIAL/PLATELET
Basophils Relative: 0 % (ref 0–1)
Eosinophils Absolute: 0.1 10*3/uL (ref 0.0–0.7)
Eosinophils Relative: 2 % (ref 0–5)
HCT: 39.4 % (ref 36.0–46.0)
Hemoglobin: 12.8 g/dL (ref 12.0–15.0)
Lymphs Abs: 1.6 10*3/uL (ref 0.7–4.0)
MCH: 30.1 pg (ref 26.0–34.0)
MCHC: 32.5 g/dL (ref 30.0–36.0)
MCV: 92.7 fL (ref 78.0–100.0)
Monocytes Absolute: 0.8 10*3/uL (ref 0.1–1.0)
Monocytes Relative: 9 % (ref 3–12)
Neutrophils Relative %: 69 % (ref 43–77)
RBC: 4.25 MIL/uL (ref 3.87–5.11)

## 2012-07-26 LAB — COMPREHENSIVE METABOLIC PANEL
ALT: 24 U/L (ref 0–35)
AST: 38 U/L — ABNORMAL HIGH (ref 0–37)
Albumin: 3.5 g/dL (ref 3.5–5.2)
CO2: 22 mEq/L (ref 19–32)
Calcium: 9.2 mg/dL (ref 8.4–10.5)
GFR calc non Af Amer: 28 mL/min — ABNORMAL LOW (ref >90)
Sodium: 139 mEq/L (ref 135–145)

## 2012-07-26 MED ORDER — MELATONIN 5 MG PO TABS: 10.0000 mg | ORAL_TABLET | Freq: Every day | ORAL | Status: DC

## 2012-07-26 MED ORDER — TRIMETHOPRIM 100 MG PO TABS
50.0000 mg | ORAL_TABLET | Freq: Every day | ORAL | Status: DC
  Administered 2012-07-27 – 2012-07-29 (×3): 50 mg via ORAL
  Filled 2012-07-26 (×5): qty 1

## 2012-07-26 MED ORDER — IOHEXOL 300 MG/ML  SOLN
50.0000 mL | Freq: Once | INTRAMUSCULAR | Status: AC | PRN
  Administered 2012-07-26: 50 mL via ORAL

## 2012-07-26 MED ORDER — MORPHINE SULFATE 2 MG/ML IJ SOLN
2.0000 mg | INTRAMUSCULAR | Status: DC | PRN
  Administered 2012-07-27 (×2): 2 mg via INTRAVENOUS
  Filled 2012-07-26 (×2): qty 1

## 2012-07-26 MED ORDER — NITROGLYCERIN 0.4 MG SL SUBL: 0.4000 mg | SUBLINGUAL_TABLET | SUBLINGUAL | Status: DC | PRN

## 2012-07-26 MED ORDER — MORPHINE SULFATE 10 MG/ML IJ SOLN
2.0000 mg | Freq: Once | INTRAMUSCULAR | Status: AC
  Administered 2012-07-26: 2 mg via INTRAMUSCULAR

## 2012-07-26 MED ORDER — HEPARIN SODIUM (PORCINE) 5000 UNIT/ML IJ SOLN
5000.0000 [IU] | Freq: Three times a day (TID) | INTRAMUSCULAR | Status: DC
  Administered 2012-07-26 – 2012-07-29 (×9): 5000 [IU] via SUBCUTANEOUS
  Filled 2012-07-26 (×11): qty 1

## 2012-07-26 MED ORDER — PANTOPRAZOLE SODIUM 40 MG PO TBEC
40.0000 mg | DELAYED_RELEASE_TABLET | Freq: Every day | ORAL | Status: DC
  Administered 2012-07-27 – 2012-07-29 (×3): 40 mg via ORAL
  Filled 2012-07-26 (×3): qty 1

## 2012-07-26 MED ORDER — SODIUM CHLORIDE 0.45 % IV SOLN
INTRAVENOUS | Status: DC
  Administered 2012-07-27: 01:00:00 via INTRAVENOUS

## 2012-07-26 MED ORDER — HYDRALAZINE HCL 50 MG PO TABS
50.0000 mg | ORAL_TABLET | Freq: Four times a day (QID) | ORAL | Status: DC
  Administered 2012-07-26 – 2012-07-29 (×13): 50 mg via ORAL
  Filled 2012-07-26 (×16): qty 1

## 2012-07-26 MED ORDER — SENNA 8.6 MG PO TABS
2.0000 | ORAL_TABLET | Freq: Every day | ORAL | Status: DC
  Administered 2012-07-27 – 2012-07-29 (×3): 17.2 mg via ORAL
  Filled 2012-07-26: qty 1
  Filled 2012-07-26 (×3): qty 2

## 2012-07-26 MED ORDER — BISACODYL 10 MG RE SUPP
10.0000 mg | Freq: Every day | RECTAL | Status: DC | PRN
  Administered 2012-07-27: 10 mg via RECTAL
  Filled 2012-07-26: qty 1

## 2012-07-26 MED ORDER — LYSINE 500 MG PO CAPS: 500.0000 mg | ORAL_CAPSULE | Freq: Every day | ORAL | Status: DC

## 2012-07-26 MED ORDER — PINDOLOL 5 MG PO TABS
2.5000 mg | ORAL_TABLET | Freq: Two times a day (BID) | ORAL | Status: DC
  Filled 2012-07-26: qty 1

## 2012-07-26 MED ORDER — PINDOLOL 5 MG PO TABS
5.0000 mg | ORAL_TABLET | Freq: Every day | ORAL | Status: DC
  Administered 2012-07-27 – 2012-07-29 (×3): 5 mg via ORAL
  Filled 2012-07-26 (×3): qty 1

## 2012-07-26 MED ORDER — PINDOLOL 5 MG PO TABS
2.5000 mg | ORAL_TABLET | Freq: Every day | ORAL | Status: DC
  Administered 2012-07-26 – 2012-07-28 (×3): 2.5 mg via ORAL
  Filled 2012-07-26 (×5): qty 1

## 2012-07-26 NOTE — H&P (Signed)
Seen and examined in the [facility] with Dr.. T.  Agree with ADMIT - which I conceptualize at this time as an admit for observation.  Briefly  Ms Rada was just [facility] from the hospital.  She has severe epigastric/subcostal pain Lt>Rt.  Duration is unclear.  It may be as brief as 2 weeks and it may have a chronic element.  Language and cultural barriers make it difficult for me to understand the nuances of the history.  Clearly, she complains that her pain is out of control.  The daughter, who seems sincere in her advocacy for her mother, supports the idea that the pain is worsening over time.  My read of the last hospitalization notes makes me think the patient is similar to what she was throughout the hospitalization.   In addition to better pain control, have we done due diligence to identify the cause of the pain?  The closest I can match her symtoms to objective findings is the compression fracture seen on recent CXR.  The ratty appearance makes me worry about a patholgic fracture - something beyond osteoporosis.  A T8,9,10 bone neoplasm could fit.  Suggest talking to radiology about which advanced imaging study to order.  I also recognize that a psych (delerium/dementia/cultural expression of emotional angst) could play a significant role and I would be unaware with the daughter as interpreter.  It would be nice to interview the patient with a non related interpreter.  I am not certain that she needs admit.  Hence my suggestion for observation status.  Still, I have enough worries that I am reluctant to send her home without a little more observation and testing.

## 2012-07-26 NOTE — Progress Notes (Signed)
Patient ZO:XWRU Lehew      DOB: 1916/10/17      EAV:[id number]  Summary or Consult: full note to follow  Patient unable to assist in visit sedated after having pain medication.  Pending CT scan.    Affirmed with [place] that her mother has expressed her desire to be full code based on her belief system surrounding the sanctity of life.  We will continue to work with the family on their goals as more information becomes available. [place] supports treating pain as needed.   Total Time: 740 pm-800 pm   Alyce Inscore L. Ladona Ridgel, MD MBA The Palliative Medicine Team at [facility] Phone: [phone] Pager: [phone]

## 2012-07-26 NOTE — H&P (Signed)
Family Medicine Teaching [facility] Admission History and Physical Service Pager: [phone]  Patient name: Yvette [name] Medical record number: [id number] Date of birth: 08-04-1916 Age: 79 y.o. Gender: female  Primary Care Provider: Majel Homer, MD  Chief Complaint: Intractable abdominal pain  Assessment and Plan: Sylva Stoltzfus is a 79 y.o. female with h/o CAD, CKD stage III, HTN, chronic diastolic heart failure, and multiple other problems presenting with intractable abdominal pain. Pt was discharged yesterday with abdominal pain workup relatively unrevealing other [place] renal US showing hydronephrosis. DDx includes referred pain from thoracic fracture, hydronephrosis, mesenteric ischemia, constipation, malignancy. Pt had bowel movement this morning making constipation less likely. 1. Intractable abdominal pain - Per family, pain today is much increased from when discharged from hospital even when taking maximum prescribed home regimen, and patient appears acutely in increased pain during visit. If we are in fact moving in a palliative direction, which family will need to continue to clarify, intractable pain is an emergency. 1. Admit to inpatient [facility] Service, attending Dr. Gwendolyn Grant, to telemetry 2. Pain control: S/p morphine 2mg  in clinic IM; continue morphine 2mg  IV q4 hours PRN and allow inpatient team to increase to previous hospitalization dose; consider calcitonin 3. Plan slow decrease to PO regimen that patient can go home on 4. CT abdomen/pelvis and chest without contrast to evaluate for malignancy or mesenteric ischemia or acuity of vertebral fracture 5. ESR, lactic acid to further evaluate for mesenteric ischemia though picture not totally consistent with this 6. CBC, CMET today 7. CBC and BMET in AM 2. Thoracic compression fracture - Discuss with radiology to evaluate fracture for acuity and if possible it is a pathologic fracture 3. Chronic diastolic CHF - Usually  takes Tues/Thurs/Sat but held during hospitalization and will continue to hold with acute illness and AKI; monitor closely for fluid overload. 4. HTN: Continue hydralazine 50mg  QID 5. CAD: Nitrostat SL prn; holding aspirin  6. GERD: Continue home PPI 7. CKD stage III with recent UTI and hydronephrosis - Urine culture 5/14 WNL s/p antibiotics; Continue prophylactic trimethoprim started at last hospitalization. Urology consulted last visit and pt had foley catheter placed due to urinary retention and left in place on discharge. Continue plan for urology follow up as outpatient. 8. FEN/GI:  1. F: 1/2 NS at 75cc/hr - careful to not overload in heart failure not taking demedex 2. N: NPO for now, while await CT results; if normal, start full liquid diet as this is what she has been tolerating at home 3. GI: ducolax suppository prn, senokot daily 9. Prophylaxis: SQ heparin 10. Disposition: Direct admit to telemetry for pain management and workup, which will likely be limited per family preference and goals of care.  1. Palliative care team had consulted at previous hospitalization, and pt refused home hospice; re-consulting to help further delineate goals of care in elderly patient with multiple medical problems. 11. Code Status: Full per family preference   History of Present Illness: Yvette [name] is a 79 y.o. female with complex PMH presenting with intractable abdominal pain. Pt was previously admitted for chest pain by cardiology and after negative workup, was transferred to [facility] Medicine service where she developed worsening abdominal pain that daughter states has now been present 2 weeks. Hospital workup was relatively WNL (AST, ALT, ALP, lipase) other [place] renal US that showed hydronephrosis and x-ray showing a vertebral fracture of uncertain acuity. She was on IV morphine in the hospital and Palliative Care consulted who weaned pt to oxycontin 10mg   BID plus oxycodone IR 5mg  q4 hours PRN x 24 hours  prior to discharge. Discharged yesterday from the hospital and daughter states she has appeared very tired since then and has been in much greater pain. She has also been constipated but had a normal dark green but non-bloody stool this morning and is taking laxative. Denies fevers, chills, or inability to tolerate PO (drinking milk, soup, and ensure). Foley catheter is draining clear urine with no issues. Also complains of back pain that is not severe. Daughter denies any recent falls or trauma to back or ribs. Denies emesis.      Patient Active Problem List   Diagnosis Date Noted  . UTI (urinary tract infection) 07/25/2012  . Hydronephrosis 07/25/2012  . CAD (coronary artery disease) 07/25/2012  . Back pain 07/25/2012  . Urinary retention 07/25/2012  . Abdominal pain, left upper quadrant 07/24/2012  . Encounter for palliative care 07/24/2012  . Weakness generalized 07/24/2012  . Chest pain 07/21/2012  . Back pain, thoracic 07/18/2012  . Herpes zoster 03/01/2012  . SOB (shortness of breath) 10/05/2011  . Acute on chronic diastolic CHF (congestive heart failure), NYHA class 1 10/05/2011  . HTN (hypertension) 10/05/2011  . Abnormal TSH 10/04/2011  . Fatigue 10/04/2011  . Esophagitis 05/01/2011  . CKD (chronic kidney disease), stage III 04/26/2011  . Physical deconditioning 04/26/2011  . Anxiety 05/05/2010  . Paroxysmal atrial flutter   . Hypertension   . Chronic diastolic heart failure   . Sick sinus syndrome   . ATRIAL FLUTTER 05/02/2010  . BRADYCARDIA-TACHYCARDIA SYNDROME 05/02/2010   Past Medical History: Past Medical History  Diagnosis Date  . Paroxysmal atrial flutter [facility] admission 2/5-04/20/2010    High fall risk-not a Coumadin candidate  . Hypertension   . Chronic diastolic heart failure     a. Echo February 2012: EF 55%; moderate LVH; mild AI; mild MR; PASP 39  . Sick sinus syndrome     a. Limits use of beta blocker-pindolol tolerated  . NSTEMI (non-ST elevated  myocardial infarction)     a. Troponin 0.12, 0.13, 0.08 during admission 04/2010 - conservative mgmt.  . Arthritis   . Anxiety   . Hydronephrosis     Chronic  . CHF (congestive heart failure)   . Chronic back pain   . Stroke    Past Surgical History: Past Surgical History  Procedure Laterality Date  . Esophagogastroduodenoscopy  04/19/2011    Procedure: ESOPHAGOGASTRODUODENOSCOPY (EGD);  Surgeon: Erick Blinks, MD;  Location: [facility] ENDOSCOPY;  Service: Gastroenterology;  Laterality: N/A;   Social History: History  Substance Use Topics  . Smoking status: Never Smoker   . Smokeless tobacco: Never Used  . Alcohol Use: No   For any additional social history documentation, please refer to relevant sections of EMR.  Family History: Family History  Problem Relation Age of Onset  . CAD Neg Hx    Allergies: No Known Allergies No current facility-administered medications on file prior to encounter.   Current Outpatient Prescriptions on File Prior to Encounter  Medication Sig Dispense Refill  . aspirin EC 81 MG tablet Take 81 mg by mouth daily.      . bisacodyl (DULCOLAX) 10 MG suppository Place 1 suppository (10 mg total) rectally daily as needed.  12 suppository  3  . hydrALAZINE (APRESOLINE) 50 MG tablet Take 1 tablet (50 mg total) by mouth 4 (four) times daily.  120 tablet  3  . Lysine 500 MG CAPS Take 500 mg by mouth daily.       Marland Kitchen  Melatonin 5 MG TABS Take 10 mg by mouth at bedtime.       . Multiple Vitamin (MULTIVITAMIN) tablet Take 1 tablet by mouth daily.      . nitroGLYCERIN (NITROSTAT) 0.4 MG SL tablet Place 1 tablet (0.4 mg total) under the tongue every 5 (five) minutes x 3 doses as needed for chest pain.  30 tablet  12  . Omega-3 Fatty Acids (FISH OIL PO) Take 1 capsule by mouth daily.      Marland Kitchen oxyCODONE (OXY IR/ROXICODONE) 5 MG immediate release tablet Take 1 tablet (5 mg total) by mouth every 4 (four) hours as needed.  30 tablet  0  . OxyCODONE (OXYCONTIN) 10 mg T12A Take 1  tablet (10 mg total) by mouth every 12 (twelve) hours.  60 tablet  0  . pantoprazole (PROTONIX) 40 MG tablet Take 40 mg by mouth daily.      . pindolol (VISKEN) 5 MG tablet Take 2.5-5 mg by mouth 2 (two) times daily. Take 5mg  in the am and 2.5mg  in the pm      . senna (SENOKOT) 8.6 MG TABS Take 2 tablets (17.2 mg total) by mouth daily.  60 each  3  . torsemide (DEMADEX) 20 MG tablet Take 10 mg by mouth 3 (three) times a week. Takes on Tues, Thurs & Sat      . trimethoprim (TRIMPEX) 100 MG tablet Take 0.5 tablets (50 mg total) by mouth daily.  30 tablet  0   Review Of Systems: Per HPI with the following additions:None Otherwise 12 point review of systems was performed and was unremarkable.  Physical Exam: BP 153/58  Pulse 76  Temp(Src) 97.5 F (36.4 C) (Oral)  Resp 20  Ht 4\' 9"  (1.448 m)  Wt 104 lb 15 oz (47.6 kg)  BMI 22.7 kg/m2  SpO2 94% Exam: GEN: Mild distress, holding left upper quadrant and moaning in pain CV: RRR, no m/r/g PULM: CTAB, no wheezes or crackles, normal effort ABD: soft, nontender, moderately distended, hyperactive bowel sounds, holding right and left upper quadrants while moaning in pain HEENT: Sclera clear, EOMI Extremities: No lower extremity edema Skin: No cyanosis or rash; bruising bilateral hands Neuro: Awake, alert and occasionally speaks to daughter asking for pain medication and asking to return to the hospital GU: Foley catheter in place draining clear yellow urine    Simone Curia, MD 07/26/2012, 4:41 PM [facility] PGY-1 Service Pager [phone]

## 2012-07-26 NOTE — Progress Notes (Signed)
Patient ID: Yvette [name], female   DOB: Apr 25, 1916, 79 y.o.   MRN: [id number] Subjective:    CC: Intractable abdominal pain  Patient being direct admitted from clinic. Please see inpatient H&P dated today.  Simone Curia 07/26/2012 5:56 PM

## 2012-07-27 DIAGNOSIS — I509 Heart failure, unspecified: Secondary | ICD-10-CM

## 2012-07-27 DIAGNOSIS — I5032 Chronic diastolic (congestive) heart failure: Secondary | ICD-10-CM

## 2012-07-27 DIAGNOSIS — T148XXA Other injury of unspecified body region, initial encounter: Secondary | ICD-10-CM

## 2012-07-27 DIAGNOSIS — I5033 Acute on chronic diastolic (congestive) heart failure: Secondary | ICD-10-CM

## 2012-07-27 DIAGNOSIS — Z515 Encounter for palliative care: Secondary | ICD-10-CM

## 2012-07-27 DIAGNOSIS — R339 Retention of urine, unspecified: Secondary | ICD-10-CM

## 2012-07-27 DIAGNOSIS — R5381 Other malaise: Secondary | ICD-10-CM

## 2012-07-27 LAB — AFP TUMOR MARKER: AFP-Tumor Marker: 5.9 ng/mL (ref 0.0–8.0)

## 2012-07-27 LAB — CA 125: CA 125: 41.5 U/mL — ABNORMAL HIGH (ref 0.0–30.2)

## 2012-07-27 LAB — CBC
HCT: 37 % (ref 36.0–46.0)
MCH: 30.4 pg (ref 26.0–34.0)
MCV: 92.3 fL (ref 78.0–100.0)
Platelets: 273 10*3/uL (ref 150–400)
RDW: 13.1 % (ref 11.5–15.5)
WBC: 6.7 10*3/uL (ref 4.0–10.5)

## 2012-07-27 LAB — BASIC METABOLIC PANEL
BUN: 29 mg/dL — ABNORMAL HIGH (ref 6–23)
CO2: 18 mEq/L — ABNORMAL LOW (ref 19–32)
Calcium: 8.9 mg/dL (ref 8.4–10.5)
Chloride: 103 mEq/L (ref 96–112)
Creatinine, Ser: 1.31 mg/dL — ABNORMAL HIGH (ref 0.50–1.10)
Glucose, Bld: 104 mg/dL — ABNORMAL HIGH (ref 70–99)

## 2012-07-27 MED ORDER — MORPHINE SULFATE 2 MG/ML IJ SOLN
2.0000 mg | INTRAMUSCULAR | Status: DC | PRN
  Administered 2012-07-27 – 2012-07-28 (×4): 2 mg via INTRAVENOUS
  Filled 2012-07-27 (×2): qty 1

## 2012-07-27 MED ORDER — LIDOCAINE 5 % EX PTCH
1.0000 | MEDICATED_PATCH | CUTANEOUS | Status: DC
  Administered 2012-07-28 (×2): 1 via TRANSDERMAL
  Filled 2012-07-27 (×4): qty 1

## 2012-07-27 MED ORDER — CALCITONIN (SALMON) 200 UNIT/ACT NA SOLN
1.0000 | Freq: Every day | NASAL | Status: DC
  Administered 2012-07-27 – 2012-07-28 (×2): 1 via NASAL
  Filled 2012-07-27 (×2): qty 3.7

## 2012-07-27 MED ORDER — MORPHINE SULFATE 2 MG/ML IJ SOLN
2.0000 mg | INTRAMUSCULAR | Status: AC
  Filled 2012-07-27 (×2): qty 1

## 2012-07-27 NOTE — Progress Notes (Signed)
Patient Yvette [name]      DOB: [birth year]      EAV:[id number]   Palliative Medicine Team at [facility] Progress Note    Subjective:  Patient is more awake today.  Hong prefers to translate for her mother.  Patient is getting some relief with morphine but continues to hold her hand over the left side of her upper abdomen.  Reviewed CT and discussed with Resident taking care of patient.  No evidence for rash at site . Discussed trial of lidocaine patch with [place]. Mostly emotional support  Given.     Filed Vitals:   07/27/12 2151  BP: 129/48  Pulse: 76  Temp: 98.2 F (36.8 C)  Resp: 18   Physical exam: General awake in mild distress, just received morphine PERRL, EOMI, anicteric Skin warm and dry.    Assessment and plan: 79 yr old vietnamese female with unclear pain left upper quadrant.  Ct shows vertebral fractures but does not seem to be acute (one slightly retropulsed at T11).  Plan for MRI .  As we get more information we will be able to move forward with goals of care. For now goals remain curative with full code status.   1. Full code  2.  Trial of lidocaine patch as adjunct to morphine.   Total time 15 min   Sarenity Ramaker L. Ladona Ridgel, MD MBA The Palliative Medicine Team at [facility] Phone: [phone] Pager: [phone]

## 2012-07-27 NOTE — Discharge Summary (Signed)
Family Medicine Teaching Service  Discharge Note : Attending Jeff Antonio Woodhams MD Pager [phone] Inpatient Team Pager:  [phone]  I have reviewed this patient and the patient's chart and have discussed discharge planning with the resident at the time of discharge. I agree with the discharge plan as above.    

## 2012-07-27 NOTE — Progress Notes (Signed)
Daily Progress Note Family Medicine Teaching Service PGY-2   Patient name: Yvette [name]  Medical record [id number] Date of birth:[birth year] Age: 79 y.o. Gender: female  LOS: 2 days   Subjective: Feeling better. Pain is controlled today with lidocaine patch. No need of morphine overnight. Updated family at bedside about procedure will take place today (MRI)  Objective: Vitals: Filed Vitals:   07/28/12 0614  BP: 140/64  Pulse: 78  Temp: 97.9 F (36.6 C)  Resp: 18    Intake/Output Summary (Last 24 hours) at 07/28/12 0949 Last data filed at 07/28/12 0617  Gross per 24 hour  Intake      0 ml  Output    850 ml  Net   -850 ml   Physical Exam: Gen:  NAD, resting peacefully in bed. HEENT: Moist mucous membranes CV: Regular rate and rhythm, no murmurs rubs or gallop PULM: Clear to auscultation bilaterally. No wheezes/rales/rhonchi ABD: Soft, non tender, non distended, normal bowel sounds. Tender to palpation on left and right upper abdomen. At the level of lateral angle of 7th rib. EXT: No edema Neuro: awake, moves all four extremities. GU foley cath placed and patent with clear urine in collector bag.  Labs and imaging:  CBC  Recent Labs Lab 07/25/12 0837 07/26/12 1627 07/27/12 0450  WBC 7.9 8.2 6.7  HGB 13.6 12.8 12.2  HCT 42.1 39.4 37.0  PLT 296 293 273   BMET  Recent Labs Lab 07/25/12 0837 07/26/12 1627 07/27/12 0450  NA 136 139 134*  K 4.5 4.6 4.4  CL 100 106 103  CO2 20 22 18*  BUN 33* 38* 29*  CREATININE 1.54* 1.54* 1.31*  GLUCOSE 174* 113* 104*  CALCIUM 9.9 9.2 8.9     07/27/2012   CT CHEST W/O CONTRAST  IMPRESSION:  1.  Compression fractures at T5, T8, and T11 as noted above, with the most posterior retropulsion at T11.  The T5 compression fracture is probably subacute; the T11 fracture is chronic and was present on 04/16/2011, and the T8 compression fracture is likely chronic. 2.  Patchy airspace opacities in the upper lobes - pneumonia is not  excluded particularly in the right upper lobe.  Scattered atelectasis and volume loss in the lungs, with interstitial accentuation in the upper lobes. 3.  Atherosclerosis and cardiomegaly.    CT ABDOMEN AND PELVIS W/O CONTRAST IMPRESSION:  1.  Progressive atrophy of the left kidney due to chronic UPJ obstruction.  Fullness in the left kidney likely dilated collecting system. 2.  Sigmoid diverticulosis. 3.  Stable hypodense liver lesions, likely benign. 4.  Atherosclerosis.   Original Report Authenticated By: Gaylyn Rong, M.D.   Medications: Scheduled Meds: . calcitonin (salmon)  1 spray Alternating Nares Daily  . heparin  5,000 Units Subcutaneous Q8H  . hydrALAZINE  50 mg Oral QID  . lidocaine  1 patch Transdermal Q24H  .  morphine injection  2 mg Intravenous NOW  . pantoprazole  40 mg Oral Daily  . pindolol  2.5 mg Oral QHS  . pindolol  5 mg Oral Daily  . senna  2 tablet Oral Daily  . trimethoprim  50 mg Oral Daily   Continuous Infusions:   PRN Meds:.bisacodyl, morphine injection, nitroGLYCERIN  Assessment and Plan:  79 y.o. female with PMHx significant for CAD, HTN, diastolic CHF, S3CKD, readmitted for abdominal pain.    1.RESP: A: Questionable infiltrate in imaging>No respiratory symptoms, no fever. No O2 requirement. Normal WBC P/  No abx needed this time. Will  continue to monitor .  2. CARDIOVASCULAR: A: CAD>No typical or atypical chest pain HTN. Systolic hypertension> controlled Diast CHF> no signs of fluid oveload P/ Continue BP meds  Continue monitoring (on telemetry)  3. RENAL: A: S3CKD> Cr slightly improving. Left kidney with atrophy Urinary retention that R hydronephrosis> now resolved s/p foley placement. P/ Continue Foley .Pt has urology evaluation as outpatient.  4. MSK: A: multiple pathologic compression fractures, concern for malignancy. CT was without contrast due to kidney function. Discussed with radiology and best study at this point will be MRI  w/o contrast P/ Cont Calcitonin Pain management with morphine 2 mg Q4 PRN and lidocaine patch. MRI without contrast today  FEN/GI: HH diet. Advanced as tolerated. Saline Lock. Prophylaxis:  Heparin [place] Disposition: Palliative Care on board. Pt and family still desires full code   D. [place] Rolene Arbour, MD PGY2, Family Medicine Teaching Service  07/28/2012 9:48 AM

## 2012-07-27 NOTE — Progress Notes (Signed)
Daily Progress Note Family Medicine Teaching Service PGY-2   Patient name: Yvette [name]  Medical record [id number] Date of birth:January 18, 1917 Age: 79 y.o. Gender: female  LOS: 1 day   Subjective: Feeling better. Pain is controlled today. Updated family at bedside about results of Ct scan done yesterday  Objective: Vitals: Filed Vitals:   07/27/12 0455  BP: 145/52  Pulse: 71  Temp: 97.4 F (36.3 C)  Resp: 20    Intake/Output Summary (Last 24 hours) at 07/27/12 1200 Last data filed at 07/27/12 0600  Gross per 24 hour  Intake 366.25 ml  Output    825 ml  Net -458.75 ml   Physical Exam: Gen:  NAD, resting peacefully in bed. HEENT: Moist mucous membranes CV: Regular rate and rhythm, no murmurs rubs or gallop PULM: Clear to auscultation bilaterally. No wheezes/rales/rhonchi ABD: Soft, non tender, non distended, normal bowel sounds. Tender to palpation on left and right upper abdomen. At the level of lateral angle of 7th rib. EXT: No edema Neuro: awake, moves all four extremities. GU foley cath placed and patent with clear urine in collector bag.  Labs and imaging:  CBC  Recent Labs Lab 07/25/12 0837 07/26/12 1627 07/27/12 0450  WBC 7.9 8.2 6.7  HGB 13.6 12.8 12.2  HCT 42.1 39.4 37.0  PLT 296 293 273   BMET  Recent Labs Lab 07/25/12 0837 07/26/12 1627 07/27/12 0450  NA 136 139 134*  K 4.5 4.6 4.4  CL 100 106 103  CO2 20 22 18*  BUN 33* 38* 29*  CREATININE 1.54* 1.54* 1.31*  GLUCOSE 174* 113* 104*  CALCIUM 9.9 9.2 8.9     07/27/2012   CT CHEST W/O CONTRAST  IMPRESSION:  1.  Compression fractures at T5, T8, and T11 as noted above, with the most posterior retropulsion at T11.  The T5 compression fracture is probably subacute; the T11 fracture is chronic and was present on 04/16/2011, and the T8 compression fracture is likely chronic. 2.  Patchy airspace opacities in the upper lobes - pneumonia is not excluded particularly in the right upper lobe.  Scattered  atelectasis and volume loss in the lungs, with interstitial accentuation in the upper lobes. 3.  Atherosclerosis and cardiomegaly.    CT ABDOMEN AND PELVIS W/O CONTRAST IMPRESSION:  1.  Progressive atrophy of the left kidney due to chronic UPJ obstruction.  Fullness in the left kidney likely dilated collecting system. 2.  Sigmoid diverticulosis. 3.  Stable hypodense liver lesions, likely benign. 4.  Atherosclerosis.   Original Report Authenticated By: Gaylyn Rong, M.D.   Medications: Scheduled Meds: . calcitonin (salmon)  1 spray Alternating Nares Daily  . heparin  5,000 Units Subcutaneous Q8H  . hydrALAZINE  50 mg Oral QID  . pantoprazole  40 mg Oral Daily  . pindolol  2.5 mg Oral QHS  . pindolol  5 mg Oral Daily  . senna  2 tablet Oral Daily  . trimethoprim  50 mg Oral Daily   Continuous Infusions: . sodium chloride 75 mL/hr at 07/27/12 0107   PRN Meds:.bisacodyl, morphine injection, nitroGLYCERIN  Assessment and Plan:  79 y.o. female with PMHx significant for CAD, HTN, diastolic CHF, S3CKD, readmitted for abdominal pain.    1.RESP: A: Questionable infiltrate in imaging>No respiratory symptoms, no fever. No O2 requirement. Normal WBC P/ Will trend CBC and fever curve. No abx needed this time.  2. CARDIOVASCULAR: A: CAD>No typical or atypical chest pain HTN. Systolic hypertension> controlled Diast CHF> no signs of fluid oveload  P/ Continue BP meds  Continue monitoring (on telemetry)  3. RENAL: A: S3CKD> Cr slightly improving. Left kidney with atrophy Urinary retention that R hydronephrosis> now resolved s/p foley placement. P/ Continue Foley .Pt has urology evaluation as outpatient. Continue trend Cr.  4. MSK: A: multiple pathologic compression fractures, concern for malignancy. CT was without contrast due to kidney function. Discussed with radiology and best study at this point will be MRI w/o contrast P/ Cont Calcitonin Pain management with morphine 2 mg Q4  PRN MRI without contrast today  FEN/GI: HH diet. Advanced as tolerated. Saline Lock. Prophylaxis:  Heparin [place] Disposition: Palliative Care on board. Pt and family still desires full code   D. [place] Rolene Arbour, MD PGY2, Family Medicine Teaching Service  07/27/2012

## 2012-07-27 NOTE — Progress Notes (Signed)
S:  Patient still in pain, controlled better on IV morphine Shanai home, but needing more Nuria every 2 hours at times.  Now is one of those times.    Exam:  Gen:  Elderly female sitting on side of bed, clutching at LUQ.  Mild distress HEENT:  MMM Heart:   Holosystolic murmur noted, RRR Lungs:  Clear CHest:  Withdraws and cries out with pressure on T5 - T7 region of Left ribs Abd:  Soft/nondistended/nontender.  Good bowel sounds throughout all four quadrants.  No masses noted.   Imp/Plan: 79 year old F with multiple medical comorbidities, admitted for pain control and found to have multiple fractures of spine on CT chest/abdomen/pelvis:  1.  Pain: - Needing more Rease every 4 hours - In mostly T5 region (just under Left breast).  Subacute fracture noted here, others are chronic -Question pathologic fx?   - Due to concern for hydronephrosis/worsening creatinine patient did NOT have contrast with CT yesterday.  Will touch base with radiology as to best test for malignancy (MRI vs CT with contrast -- noting that her creatinine has improved).  2.  Spinal fractures: - family asking about surgery for fractures. - multiple comorbidities means she is not likely to be a surgical candidate.  - Pain control with search for cause of fracture -- she has had no falls for past 1.5 years according to family who live with her.  Makes this more likely pathologic, although calcium WNL currently.  3.  Hydronephrosis: - Left side has resolved and now with atrophic kidney.  - Recently (2 days ago) with new hydronephrosis of Right kidney - BOth of these resolved with placement of Foley.  Seems that left hydro possible secondary to chronic urinary retention.   - Already has urology FU scheduled for this coming Wednesday.    4.  Disposition: - Family states that patient has expressed desire "to live to 89.  Wants to live as long as possible." - THey have NOT made her comfort care.  Although treatment options  would likely be limited by medical comorbidities, will expand search for reason for pain with hopes that if something is found, may help family in decision for goals of care.   Tobey Grim, MD 07/27/2012 12:39 PM

## 2012-07-27 NOTE — Progress Notes (Signed)
FMTS Attending Daily Note:  Renold Don MD  [phone] pager  Family Practice pager:  [phone] I have seen and examined this patient and have reviewed their chart. I have discussed this patient with the resident. I agree with the resident's findings, assessment and care plan.  Additionally:  See separate note for details.   Tobey Grim, MD 07/27/2012 12:39 PM

## 2012-07-28 ENCOUNTER — Inpatient Hospital Stay (HOSPITAL_COMMUNITY): Payer: Medicaid Other

## 2012-07-28 DIAGNOSIS — IMO0002 Reserved for concepts with insufficient information to code with codable children: Secondary | ICD-10-CM

## 2012-07-28 MED ORDER — MORPHINE SULFATE 2 MG/ML IJ SOLN
INTRAMUSCULAR | Status: AC
  Administered 2012-07-28: 2 mg via INTRAVENOUS
  Filled 2012-07-28: qty 1

## 2012-07-28 MED ORDER — OXYCODONE HCL 5 MG PO TABS
5.0000 mg | ORAL_TABLET | ORAL | Status: DC | PRN
  Administered 2012-07-28 – 2012-07-29 (×2): 5 mg via ORAL
  Filled 2012-07-28 (×2): qty 1

## 2012-07-28 MED ORDER — MORPHINE SULFATE 2 MG/ML IJ SOLN
2.0000 mg | INTRAMUSCULAR | Status: DC | PRN
  Administered 2012-07-29: 2 mg via INTRAVENOUS
  Filled 2012-07-28: qty 1

## 2012-07-28 MED ORDER — OXYCODONE HCL ER 10 MG PO T12A
10.0000 mg | EXTENDED_RELEASE_TABLET | Freq: Two times a day (BID) | ORAL | Status: DC
  Administered 2012-07-28 – 2012-07-29 (×2): 10 mg via ORAL
  Filled 2012-07-28 (×2): qty 1

## 2012-07-28 NOTE — Progress Notes (Addendum)
Patient ZO:XWRU Berrios      DOB: 07-01-1916      EAV:[id number]   Palliative Medicine Team at [facility] Progress Note    Subjective: Patient resting more comfortably [place] last evening.  Extended family at bedside.  Patient not holding left side like she was last evening.  Plan for MRI today    Filed Vitals:   07/28/12 0614  BP: 140/64  Pulse: 78  Temp: 97.9 F (36.6 C)  Resp: 18   Physical exam: General : less distress Pupils equal, mmm Chest decreased, no R,R, W CVS: regular , rate and rhythm Abd soft, no rash, lidocaine patch in place Ext: warm, no edema   Lab Results  Component Value Date   CREATININE 1.31* 07/27/2012   BUN 29* 07/27/2012   NA 134* 07/27/2012   K 4.4 07/27/2012   CL 103 07/27/2012   CO2 18* 07/27/2012   Lab Results  Component Value Date   WBC 6.7 07/27/2012   HGB 12.2 07/27/2012   HCT 37.0 07/27/2012   MCV 92.3 07/27/2012   PLT 273 07/27/2012   Assessment and plan: 79 yr old [place] female readmitted with severe left upper abdomen pain of unclear etiology  1.  Full code  2.  Abdomen Pain likely radiating form back .  MRI pending .  Minimal pain medication used yesterday.  Lidocaine patch seems to be helping.  She is not holding her side as she was.  Of note patient documented as having shingles on left thorax back 03/01/12.  Wonder if this is post herpetic neuralgia.  If no significant pathology on MRI consider adding neurontin low dose and adding lidocaine patch.  Continue curative treatment .   Time 15 minutes  Werner Labella L. Ladona Ridgel, MD MBA The Palliative Medicine Team at [facility] Phone: [phone] Pager: [phone]

## 2012-07-28 NOTE — Progress Notes (Signed)
FMTS Attending Daily Note:  Renold Don MD  [phone] pager  Family Practice pager:  [phone] I have seen and examined this patient and have reviewed their chart. I have discussed this patient with the resident. I agree with the resident's findings, assessment and care plan.  Additionally:  Pain more controlled on morhpine.  Will attempt PO pain medications.  Problem last time was that she got behind on pain medication administration, had intractable pain, came back to clinic, and was admitted.  Still awaiting MRI for further elucidation of this pain -- hoping that if we find a pathological reason for her fractures, it will help family in their goals of care.  Possible DC home later today -- provided that we can control her pain on orals.    Tobey Grim, MD 07/28/2012 1:55 PM

## 2012-07-29 DIAGNOSIS — I10 Essential (primary) hypertension: Secondary | ICD-10-CM

## 2012-07-29 DIAGNOSIS — R1012 Left upper quadrant pain: Secondary | ICD-10-CM

## 2012-07-29 DIAGNOSIS — N183 Chronic kidney disease, stage 3 (moderate): Secondary | ICD-10-CM

## 2012-07-29 DIAGNOSIS — N1330 Unspecified hydronephrosis: Secondary | ICD-10-CM

## 2012-07-29 LAB — URINE MICROSCOPIC-ADD ON

## 2012-07-29 LAB — URINALYSIS, ROUTINE W REFLEX MICROSCOPIC
Bilirubin Urine: NEGATIVE
Nitrite: NEGATIVE
Specific Gravity, Urine: 1.018 (ref 1.005–1.030)
Urobilinogen, UA: 0.2 mg/dL (ref 0.0–1.0)

## 2012-07-29 MED ORDER — OXYCODONE HCL ER 10 MG PO T12A: 10.0000 mg | EXTENDED_RELEASE_TABLET | Freq: Two times a day (BID) | ORAL | Status: DC

## 2012-07-29 MED ORDER — CALCITONIN (SALMON) 200 UNIT/ACT NA SOLN: 1.0000 | Freq: Every day | NASAL | Status: DC

## 2012-07-29 MED ORDER — OXYCODONE HCL ER 15 MG PO T12A: 15.0000 mg | EXTENDED_RELEASE_TABLET | Freq: Two times a day (BID) | ORAL | Status: DC

## 2012-07-29 MED ORDER — LIDOCAINE 5 % EX PTCH: 1.0000 | MEDICATED_PATCH | CUTANEOUS | Status: DC

## 2012-07-29 NOTE — Progress Notes (Signed)
Patient ID: Yvette [name], female   DOB: [birth year], 79 y.o.   MRN: [id number]  Daily Progress Note Family Medicine Teaching Service PGY-1   Patient name: Yvette [name]  Medical record [phone] Date of birth:1916-07-10 Age: 79 y.o. Gender: female  LOS: 2 days   Subjective: Feeling better. Pain is controlled with morphine.   Objective: BP 158/63  Pulse 75  Temp(Src) 97.8 F (36.6 C) (Oral)  Resp 20  Ht 4\' 9"  (1.448 m)  Wt 104 lb 15 oz (47.6 kg)  BMI 22.7 kg/m2  SpO2 94%   Intake/Output Summary (Last 24 hours) at 07/29/12 1545 Last data filed at 07/29/12 1300  Gross per 24 hour  Intake      0 ml  Output    575 ml  Net   -575 ml    Physical Exam: Gen:  NAD, resting peacefully in bed. HEENT: Moist mucous membranes CV: Regular rate and rhythm, no murmurs rubs or gallop PULM: Clear to auscultation bilaterally. No wheezes/rales/rhonchi ABD: Soft, non tender, non distended, normal bowel sounds. Tender to palpation on left and right upper abdomen. At the level of lateral angle of 7th rib. EXT: No edema Neuro: awake, moves all four extremities. GU foley cath placed and patent with clear urine in collector bag.  Labs and imaging:  No recent.  07/27/2012   CT CHEST W/O CONTRAST  IMPRESSION:  1.  Compression fractures at T5, T8, and T11 as noted above, with the most posterior retropulsion at T11.  The T5 compression fracture is probably subacute; the T11 fracture is chronic and was present on 04/16/2011, and the T8 compression fracture is likely chronic. 2.  Patchy airspace opacities in the upper lobes - pneumonia is not excluded particularly in the right upper lobe.  Scattered atelectasis and volume loss in the lungs, with interstitial accentuation in the upper lobes. 3.  Atherosclerosis and cardiomegaly.    CT ABDOMEN AND PELVIS W/O CONTRAST IMPRESSION:  1.  Progressive atrophy of the left kidney due to chronic UPJ obstruction.  Fullness in the left kidney likely dilated collecting  system. 2.  Sigmoid diverticulosis. 3.  Stable hypodense liver lesions, likely benign. 4.  Atherosclerosis.   Original Report Authenticated By: Gaylyn Rong, M.D.  Mr Abdomen Wo Contrast  07/29/2012   *RADIOLOGY REPORT*  Clinical Data: Abdominal pain  MRI ABDOMEN WITHOUT CONTRAST  Technique:  Multiplanar multisequence MR imaging of the abdomen was performed. No intravenous contrast was administered.  Comparison: CT 07/26/2012  Findings: Sensitivity and specificity for detection of possible malignancy is significantly reduced due to lack of contrast.  Cardiomegaly noted.  Volume averaging versus 8 mm sludge ball or polyp noted at the gallbladder fundus, image 25 series 4.  No gallbladder wall thickening, pericholecystic fluid, or surrounding edema.  Scattered T2 hyperintense renal cortical cysts and stable left renal pelvic collecting system dilatation, likely UPJ obstruction, again noted.  Sub centimeter T2 hyperintense hepatic lesions are noted which could be cysts or hemangiomata.  No ascites or lymphadenopathy.  Pancreas and spleen are normal.  Motion artifact degrades imaging.  T2 hyperintense lesions in the sacrum are most likely Tarlov cysts. Thoracic compression deformities are again noted, seen on coronal imaging only and not reevaluated.  Incomplete imaging of the lung bases demonstrates left lower lobe consolidation and trace left pleural effusion.  IMPRESSION: Sub centimeter T2 hyperintense hepatic and renal cortical lesions, likely cysts and/or potentially hepatic hemangiomata.  Allowing for degradation due to patient motion and lack of contrast, no MRI evidence  for intra-abdominal malignancy.  Probable chronic left UPJ obstruction.  Left lower lobe consolidation and trace pleural fluid again noted which could represent atelectasis although early pneumonia could have a similar appearance but is not further evaluated with MRI.  Gallbladder polyp versus sludge ball or stone, without other MRI  evidence for acute cholecystitis.   Original Report Authenticated By: Christiana Pellant, M.D.    Continuous Infusions:   PRN Meds:.bisacodyl, morphine injection, nitroGLYCERIN  Assessment and Plan:  79 y.o. female with PMHx significant for CAD, HTN, diastolic CHF, S3CKD, readmitted for abdominal pain, that is now controlled. Patient has T5,8 and 11 compression fractures.   1.RESP:Questionable infiltrate in imaging>No respiratory symptoms, no fever. No O2 requirement. Normal WBC - No abx needed this time. Will continue to monitor .  2. CARDIOVASCULAR: - CAD>No typical or atypical chest pain - HTN. Systolic hypertension> controlled; continue meds - Diast CHF> no signs of fluid oveload - Continue monitoring (on telemetry)  3. RENAL: S3CKD> Cr slightly improving. Left kidney with atrophy - Urinary retention that R hydronephrosis> now resolved s/p foley placement; Continue Foley . Pt has urology evaluation as outpatient.  4. MSK: multiple pathologic compression fractures, concern for malignancy. CT was without contrast due to kidney function. - Cont Calcitonin - Pain management: With PO medications only. OxyContin 15 BID and oxy IR for breakthrough.  - MRI positive for renal cyst vs hepatic hemangiomata, no evidence of malignancy (abdominal). GB sludge vs. polyp   FEN/GI: HH diet. Advanced as tolerated. Saline Lock. Prophylaxis:  Heparin [place] Disposition: Palliative Care on board. Pt and family still desires full code. Possible discharge later this evening if pain remains controlled on PO    07/28/2012 9:48 AM

## 2012-07-29 NOTE — Progress Notes (Signed)
S:  Patient's pain is much more controlled today.  Sleeping comfortably.  Family ready to try taking her home again.  Exam: Gen:  Elderly female, asleep, easily arousable.   Heart:  RRR Lungs: Clear Chest:  No tenderness currently at LUQ/8 - 10th ribs.   Abd:  Soft/nondistended/nontender.  Good bowel sounds throughout all four quadrants.  No masses noted.  Ext:  No edema BL  Imp/Plan: 1.  Pain control: - Seems to be secondary to multiple acute and chronic compression fractures - Question if pathologic? See below - As controlled now, can DC home later today.    2.  Compression fracture: - As above, concern for pathologic - She has several concerning findings:  Elevated CEA-125 (concerning for ovarian CA), chronic Left hydronephrosis secondary to Left uteropelvic junction obstruction, and question of bony mets.   - Urine cytology obtained today.   - Will touch base with oncology regarding any outpatient workup that might be useful.    3.  Ureteropelvic junction obstruction: - Unclear etiology - Imaging has not proved very helpful -- not able to obtain contrast despite both CT and which resolved with passage of Foley but collecting duct still obstructed - Foley in place, has followup with Urology on outpatient basis.    Tobey Grim, MD

## 2012-07-29 NOTE — Progress Notes (Signed)
FMTS Attending Daily Note:  Renold Don MD  [phone] pager  Family Practice pager:  [phone] I have seen and examined this patient and have reviewed their chart. I have discussed this patient with the resident. I agree with the resident's findings, assessment and care plan.  Additionally:  See my separate note for details  Tobey Grim, MD 07/29/2012 4:05 PM

## 2012-07-29 NOTE — Progress Notes (Signed)
Pt discharged to home

## 2012-07-29 NOTE — Discharge Summary (Signed)
Physician Discharge Summary  Patient ID: Yvette [name] MRN: [id number] DOB/AGE: 77-Oct-1918 77 y.o.  Admit date: 07/26/2012 Discharge date: 07/29/2012  Admission Diagnoses: Abdominal pain, recurrent  Discharge Diagnoses:  Principal Problem:   Vertebral fracture, pathological Active Problems:   Anxiety   CKD (chronic kidney disease), stage III   HTN (hypertension)   Abdominal pain, left upper quadrant   Hydronephrosis   Discharged Condition: good  Hospital Course: Yvette [name] is a 79 y.o. female with h/o CAD, CKD stage III, HTN, chronic diastolic heart failure, and multiple medical problems presenting with intractable abdominal pain. Pt was discharged 1 day prior, with abdominal pain workup relatively unrevealing other Yvette [name] renal US showing hydronephrosis. Admitted to inpatient service to rule out other possible DDx.  Intermittent abdmonial pain: Per family, pain was much increased  from when discharged from hospital even when taking maximum prescribed home regimen. After further investigation it sounds as if maybe they fell behind on pain medications and their timing. Patient was placed on telemetry unit. Pain was controlled with morphine 2mg  and then tapered down to home regimen prior to discharge with good results.  Calcitonin was started for vertebral compression fractures (see below). CT abdomen/pelvis was obtained see results below. Vertebral fracture is a probable source of her pain that is referred from her thoracic compression fractures. Three fractures total, with one acute. ESR, lactic acid were normal, making mesenteric ischemia or colitis unlikely. There was a question of a history of shingles, possibly near the area that she is grasping at, that may be causing her pain. Family does not believe that was the area affected by shingles, however patient was placed on a lidocaine patch during admission and it seemed to help.   CHF: Usually takes lasix Tues/Thurs/Sat but held during  hospitalization and will continue to hold with acute illness and AKI.  HTN: Continue hydralazine 50mg  QIDCAD: Nitrostat SL prn; holding aspirin   GERD: Continued home PPI  CKD stage III with recent UTI and hydronephrosis - Urine culture 5/14 WNL s/p antibiotics; Continue prophylactic trimethoprim started at last hospitalization. Urology consulted last visit and pt had foley catheter placed due to urinary retention and left in place on discharge. Continue plan for urology follow up as outpatient.  GI: ducolax suppository prn   Consults: None  Significant Diagnostic Studies:   07/27/2012   .  CT ABDOMEN AND PELVIS  Findings:  Stable small hepatic hypodense lesions.  Stable splenic vascular calcifications.  Severely atrophic left kidney noted with dilated collecting system favoring chronic UPJ obstruction.  Aortoiliac atherosclerotic calcification.  Foley catheter noted in the urinary bladder.  Appendix normal.  Sigmoid diverticulosis observed.  No lumbar compression fracture noted.  No additional significant findings in the abdomen or pelvis.  IMPRESSION:  1.  Progressive atrophy of the left kidney due to chronic UPJ obstruction.  Fullness in the left kidney likely dilated collecting system. 2.  Sigmoid diverticulosis. 3.  Stable hypodense liver lesions, likely benign. 4.  Atherosclerosis.   Original Report Authenticated By: Gaylyn Rong, M.D.   Ct Chest Wo Contrast 07/27/2012     CT ABDOMEN AND PELVIS  Findings:  Stable small hepatic hypodense lesions.  Stable splenic vascular calcifications.  Severely atrophic left kidney noted with dilated collecting system favoring chronic UPJ obstruction.  Aortoiliac atherosclerotic calcification.  Foley catheter noted in the urinary bladder.  Appendix normal.  Sigmoid diverticulosis observed.  No lumbar compression fracture noted.  No additional significant findings in the abdomen or pelvis.  IMPRESSION:  1.  Progressive atrophy of the left kidney due to  chronic UPJ obstruction.  Fullness in the left kidney likely dilated collecting system. 2.  Sigmoid diverticulosis. 3.  Stable hypodense liver lesions, likely benign. 4.  Atherosclerosis.   Original Report Authenticated By: Gaylyn Rong, M.D.    Treatments: analgesia: Morphine  Discharge Exam: Blood pressure 158/63, pulse 75, temperature 97.8 F (36.6 C), temperature source Oral, resp. rate 20, height 4\' 9"  (1.448 m), weight 104 lb 15 oz (47.6 kg), SpO2 94.00%.   Disposition: 01-Home or Self Care      Discharge Orders   Future Orders Complete By Expires     Diet - low sodium heart healthy  As directed     Increase activity slowly  As directed         Medication List    TAKE these medications       bisacodyl 10 MG suppository  Commonly known as:  DULCOLAX  Place 10 mg rectally daily as needed for constipation.     calcitonin (salmon) 200 UNIT/ACT nasal spray  Commonly known as:  MIACALCIN/FORTICAL  Place 1 spray into the nose daily.     FISH OIL PO  Take 1 capsule by mouth daily.     hydrALAZINE 50 MG tablet  Commonly known as:  APRESOLINE  Take 1 tablet (50 mg total) by mouth 4 (four) times daily.     lidocaine 5 %  Commonly known as:  LIDODERM  Place 1 patch onto the skin daily. Remove & Discard patch within 12 hours or as directed by MD     Lysine 500 MG Caps  Take 500 mg by mouth daily.     Melatonin 5 MG Tabs  Take 10 mg by mouth at bedtime.     multivitamin tablet  Take 1 tablet by mouth daily.     nitroGLYCERIN 0.4 MG SL tablet  Commonly known as:  NITROSTAT  Place 0.4 mg under the tongue every 5 (five) minutes as needed for chest pain.     OxyCODONE 10 mg T12a  Commonly known as:  OXYCONTIN  Take 1 tablet (10 mg total) by mouth every 12 (twelve) hours.     oxyCODONE 5 MG immediate release tablet  Commonly known as:  Oxy IR/ROXICODONE  Take 5 mg by mouth every 4 (four) hours as needed for pain.     pantoprazole 40 MG tablet  Commonly known  as:  PROTONIX  Take 40 mg by mouth daily.     pindolol 5 MG tablet  Commonly known as:  VISKEN  Take 2.5-5 mg by mouth 2 (two) times daily. Take 5mg  in the am and 2.5mg  in the pm     senna 8.6 MG Tabs  Commonly known as:  SENOKOT  Take 2 tablets (17.2 mg total) by mouth daily.     torsemide 20 MG tablet  Commonly known as:  DEMADEX  Take 10 mg by mouth every Tuesday, Thursday, and Saturday at 6 PM.     trimethoprim 100 MG tablet  Commonly known as:  TRIMPEX  Take 0.5 tablets (50 mg total) by mouth daily.       - Outpatient recommendations: - Goals of care was performed and family is wanting all interventions. Discussion of chronic pain and keeping her comfortable took place.  - Gabapentin is an option for outpatient setting, if provider highly suspects shingles neuropathy pain.  - protein electrophoresis was obtained prior to discharge, to rule out Multiple myeloma, and will need  follow up for outpatient.    SignedFelix Pacini 07/29/2012, 5:17 PM

## 2012-07-30 ENCOUNTER — Telehealth: Payer: Self-pay | Admitting: Family Medicine

## 2012-07-30 NOTE — Telephone Encounter (Signed)
Called and spoke with daughter [place] to check on patient.  Ms Giannone had some trouble with pain last night -- but it sounds like she is not getting her evening ER dose of Oxycodone.  Discussed with daughter again medication regimen.    It will be difficult to have Ms Cozart come back in for follow-up as the car rides are very difficult for her.  She would be a great candidate for a home visit from the [place] team.  I will touch base and see if this is possible.

## 2012-07-31 LAB — PROTEIN ELECTROPHORESIS, SERUM
Albumin ELP: 45.4 % — ABNORMAL LOW (ref 55.8–66.1)
Beta 2: 6.4 % (ref 3.2–6.5)

## 2012-07-31 LAB — UIFE/LIGHT CHAINS/TP QN, 24-HR UR
Alpha 2, Urine: DETECTED — AB
Beta, Urine: DETECTED — AB
Free Kappa Lt Chains,Ur: 16.9 mg/dL — ABNORMAL HIGH (ref 0.14–2.42)
Free Kappa/Lambda Ratio: 7.41 ratio (ref 2.04–10.37)
Total Protein, Urine: 34.2 mg/dL

## 2012-08-01 NOTE — Discharge Summary (Signed)
Family Medicine Teaching Service  Discharge Note : Attending Renold Don MD Pager [phone] Inpatient Team Pager:  [phone]  I have reviewed this patient and the patient's chart and have discussed discharge planning with the resident at the time of discharge. I agree with the discharge plan as above.  Workup proceeding for any cause of pathologic fracture.  Imaging negative.  SPEP/UPEP pending.  No rash noted for shingles.  DC'ed home with pain control and outpatient follow-up to further determine if these indeed are pathological fractures.

## 2012-08-06 ENCOUNTER — Telehealth: Payer: Self-pay | Admitting: *Deleted

## 2012-08-06 NOTE — Telephone Encounter (Signed)
Received prior authorization form from [facility] for lidocaine patches.  Form placed in doctor's box for completion, attached are the preferred drugs. Will have md return form after completion to me. Wyatt Haste, RN-BSN

## 2012-08-08 ENCOUNTER — Encounter (HOSPITAL_COMMUNITY): Payer: Self-pay | Admitting: *Deleted

## 2012-08-08 DIAGNOSIS — N183 Chronic kidney disease, stage 3 unspecified: Secondary | ICD-10-CM | POA: Insufficient documentation

## 2012-08-08 DIAGNOSIS — R339 Retention of urine, unspecified: Secondary | ICD-10-CM | POA: Insufficient documentation

## 2012-08-08 DIAGNOSIS — E875 Hyperkalemia: Secondary | ICD-10-CM | POA: Insufficient documentation

## 2012-08-08 DIAGNOSIS — Z87448 Personal history of other diseases of urinary system: Secondary | ICD-10-CM | POA: Insufficient documentation

## 2012-08-08 DIAGNOSIS — G8929 Other chronic pain: Secondary | ICD-10-CM | POA: Insufficient documentation

## 2012-08-08 DIAGNOSIS — F411 Generalized anxiety disorder: Secondary | ICD-10-CM | POA: Insufficient documentation

## 2012-08-08 DIAGNOSIS — Z8679 Personal history of other diseases of the circulatory system: Secondary | ICD-10-CM | POA: Insufficient documentation

## 2012-08-08 DIAGNOSIS — I5032 Chronic diastolic (congestive) heart failure: Secondary | ICD-10-CM | POA: Insufficient documentation

## 2012-08-08 DIAGNOSIS — I129 Hypertensive chronic kidney disease with stage 1 through stage 4 chronic kidney disease, or unspecified chronic kidney disease: Secondary | ICD-10-CM | POA: Insufficient documentation

## 2012-08-08 DIAGNOSIS — I252 Old myocardial infarction: Secondary | ICD-10-CM | POA: Insufficient documentation

## 2012-08-08 DIAGNOSIS — M129 Arthropathy, unspecified: Secondary | ICD-10-CM | POA: Insufficient documentation

## 2012-08-08 DIAGNOSIS — Z79899 Other long term (current) drug therapy: Secondary | ICD-10-CM | POA: Insufficient documentation

## 2012-08-08 DIAGNOSIS — Z8673 Personal history of transient ischemic attack (TIA), and cerebral infarction without residual deficits: Secondary | ICD-10-CM | POA: Insufficient documentation

## 2012-08-08 LAB — COMPREHENSIVE METABOLIC PANEL
ALT: 16 U/L (ref 0–35)
Alkaline Phosphatase: 70 U/L (ref 39–117)
CO2: 19 mEq/L (ref 19–32)
GFR calc Af Amer: 24 mL/min — ABNORMAL LOW (ref >90)
GFR calc non Af Amer: 21 mL/min — ABNORMAL LOW (ref >90)
Glucose, Bld: 135 mg/dL — ABNORMAL HIGH (ref 70–99)
Potassium: 5.4 mEq/L — ABNORMAL HIGH (ref 3.5–5.1)
Sodium: 133 mEq/L — ABNORMAL LOW (ref 135–145)
Total Protein: 8.3 g/dL (ref 6.0–8.3)

## 2012-08-08 LAB — CBC WITH DIFFERENTIAL/PLATELET
Lymphocytes Relative: 22 % (ref 12–46)
Lymphs Abs: 1.6 10*3/uL (ref 0.7–4.0)
Neutrophils Relative %: 70 % (ref 43–77)
Platelets: 331 10*3/uL (ref 150–400)
RBC: 4.48 MIL/uL (ref 3.87–5.11)
WBC: 7.1 10*3/uL (ref 4.0–10.5)

## 2012-08-08 NOTE — ED Notes (Signed)
Pts daughter reports that pt was seen at [facility] urology and was called today and told to come to the ED for urinary retention and renal failure.  Pt reports urinating a small amount at a time with trouble.

## 2012-08-09 ENCOUNTER — Emergency Department (HOSPITAL_COMMUNITY)
Admission: EM | Admit: 2012-08-09 | Discharge: 2012-08-09 | Disposition: A | Discharge status: Home / Self Care | Payer: Medicaid Other | Attending: Emergency Medicine | Admitting: Emergency Medicine

## 2012-08-09 DIAGNOSIS — R339 Retention of urine, unspecified: Secondary | ICD-10-CM

## 2012-08-09 DIAGNOSIS — E875 Hyperkalemia: Secondary | ICD-10-CM

## 2012-08-09 DIAGNOSIS — N183 Chronic kidney disease, stage 3 unspecified: Secondary | ICD-10-CM

## 2012-08-09 LAB — URINALYSIS, ROUTINE W REFLEX MICROSCOPIC
Bilirubin Urine: NEGATIVE
Hgb urine dipstick: NEGATIVE
Ketones, ur: NEGATIVE mg/dL
Nitrite: NEGATIVE
pH: 5.5 (ref 5.0–8.0)

## 2012-08-09 LAB — URINE MICROSCOPIC-ADD ON

## 2012-08-09 MED ORDER — SODIUM CHLORIDE 0.9 % IV BOLUS (SEPSIS)
1000.0000 mL | Freq: Once | INTRAVENOUS | Status: AC
  Administered 2012-08-09: 1000 mL via INTRAVENOUS

## 2012-08-09 MED ORDER — SODIUM POLYSTYRENE SULFONATE 15 GM/60ML PO SUSP
60.0000 g | Freq: Once | ORAL | Status: AC
  Administered 2012-08-09: 60 g via ORAL
  Filled 2012-08-09: qty 180
  Filled 2012-08-09: qty 60

## 2012-08-09 NOTE — ED Notes (Signed)
NURSE FIRST ROUNDS : PT. SLEEPING WITH NO DISTRESS, RESPIRATIONS UNLABORED , NO PAIN AT THIS TIME , FAMILY WITH PT. , NURSE EXPLAINED DELAY , WAIT TIME AND PROCESS.

## 2012-08-09 NOTE — ED Provider Notes (Signed)
History     CSN: [id number]  Arrival date & time 08/08/12  2232   First MD Initiated Contact with Patient 08/09/12 [phone]      Chief Complaint  Patient presents with  . Acute Renal Failure  . Urinary Retention    (Consider location/radiation/quality/duration/timing/severity/associated sxs/prior treatment) HPI  Yvette [name] is a pleasant 79 yo [place] immigrant who has multiple chronic medical problems and a history of both urinary retention (etiology unclear) and CKD.  She was seen by her urologist late this afternoon for urinary retention. He referred her to the ED.   Patient had a Foley placed and has bladder has drained 700cc urine. Pt resting comfortable. No fever, per family. No N/V. Taking all meds as prescribed.  Past Medical History  Diagnosis Date  . Paroxysmal atrial flutter [facility] admission 2/5-04/20/2010    High fall risk-not a Coumadin candidate  . Hypertension   . Chronic diastolic heart failure     a. Echo February 2012: EF 55%; moderate LVH; mild AI; mild MR; PASP 39  . Sick sinus syndrome     a. Limits use of beta blocker-pindolol tolerated  . NSTEMI (non-ST elevated myocardial infarction)     a. Troponin 0.12, 0.13, 0.08 during admission 04/2010 - conservative mgmt.  . Arthritis   . Anxiety   . Hydronephrosis     Chronic  . CHF (congestive heart failure)   . Chronic back pain   . Stroke     Past Surgical History  Procedure Laterality Date  . Esophagogastroduodenoscopy  04/19/2011    Procedure: ESOPHAGOGASTRODUODENOSCOPY (EGD);  Surgeon: Erick Blinks, MD;  Location: [facility] ENDOSCOPY;  Service: Gastroenterology;  Laterality: N/A;    Family History  Problem Relation Age of Onset  . CAD Neg Hx     History  Substance Use Topics  . Smoking status: Never Smoker   . Smokeless tobacco: Never Used  . Alcohol Use: No    OB History   Grav Para Term Preterm Abortions TAB SAB Ect Mult Living                  Review of Systems - unable to obtain from the patient  due to sleeping soundly and family requests not to wake  Allergies  Review of patient's allergies indicates no known allergies.  Home Medications   Current Outpatient Rx  Name  Route  Sig  Dispense  Refill  . bisacodyl (DULCOLAX) 10 MG suppository   Rectal   Place 10 mg rectally daily as needed for constipation.         . calcitonin, salmon, (MIACALCIN/FORTICAL) 200 UNIT/ACT nasal spray   Nasal   Place 1 spray into the nose daily.   3.7 mL   12   . hydrALAZINE (APRESOLINE) 50 MG tablet   Oral   Take 1 tablet (50 mg total) by mouth 4 (four) times daily.   120 tablet   3   . lidocaine (LIDODERM) 5 %   Transdermal   Place 1 patch onto the skin daily. Remove & Discard patch within 12 hours or as directed by MD   30 patch   0   . Lysine 500 MG CAPS   Oral   Take 500 mg by mouth daily.          . Melatonin 5 MG TABS   Oral   Take 10 mg by mouth at bedtime.          . Multiple Vitamin (MULTIVITAMIN) tablet   Oral  Take 1 tablet by mouth daily.         . nitroGLYCERIN (NITROSTAT) 0.4 MG SL tablet   Sublingual   Place 0.4 mg under the tongue every 5 (five) minutes as needed for chest pain.         . Omega-3 Fatty Acids (FISH OIL PO)   Oral   Take 1 capsule by mouth daily.         Marland Kitchen oxyCODONE (OXY IR/ROXICODONE) 5 MG immediate release tablet   Oral   Take 5 mg by mouth every 4 (four) hours as needed for pain.         . OxyCODONE (OXYCONTIN) 10 mg T12A   Oral   Take 1 tablet (10 mg total) by mouth every 12 (twelve) hours.   60 tablet   0   . pantoprazole (PROTONIX) 40 MG tablet   Oral   Take 40 mg by mouth daily.         . pindolol (VISKEN) 5 MG tablet   Oral   Take 2.5-5 mg by mouth 2 (two) times daily. Take 5mg  in the am and 2.5mg  in the pm         . senna (SENOKOT) 8.6 MG TABS   Oral   Take 2 tablets (17.2 mg total) by mouth daily.   60 each   3   . torsemide (DEMADEX) 20 MG tablet   Oral   Take 10 mg by mouth every Tuesday,  Thursday, and Saturday at 6 PM.         . trimethoprim (TRIMPEX) 100 MG tablet   Oral   Take 0.5 tablets (50 mg total) by mouth daily.   30 tablet   0     BP 110/50  Pulse 72  Temp(Src) 97.1 F (36.2 C) (Oral)  Resp 14  SpO2 94%  Physical Exam Gen: thin, elderly woman, sleeping, NAD Head: NCAT Eyes: PERL, EOMI Nose: no epistaixis or rhinorrhea Mouth/throat: mucosa is moist and pink, oral dentition poor Neck: supple, no stridor Lungs: CTA B, no wheezing, rhonchi or rales CV: RRR, no murmur Abd: soft, notender, nondistended Back: no ttp, no cva ttp Skin: no rashese, wnl Ext: no edema Neuro: w/d all 4 ext to stimuli  ED Course  Procedures (including critical care time)   Results for orders placed during the hospital encounter of 08/09/12 (from the past 24 hour(s))  CBC WITH DIFFERENTIAL     Status: None   Collection Time    08/08/12 10:55 PM      Result Value Range   WBC 7.1  4.0 - 10.5 K/uL   RBC 4.48  3.87 - 5.11 MIL/uL   Hemoglobin 13.5  12.0 - 15.0 g/dL   HCT 62.1  30.8 - 65.7 %   MCV 93.5  78.0 - 100.0 fL   MCH 30.1  26.0 - 34.0 pg   MCHC 32.2  30.0 - 36.0 g/dL   RDW 84.6  96.2 - 95.2 %   Platelets 331  150 - 400 K/uL   Neutrophils Relative % 70  43 - 77 %   Neutro Abs 4.9  1.7 - 7.7 K/uL   Lymphocytes Relative 22  12 - 46 %   Lymphs Abs 1.6  0.7 - 4.0 K/uL   Monocytes Relative 6  3 - 12 %   Monocytes Absolute 0.4  0.1 - 1.0 K/uL   Eosinophils Relative 2  0 - 5 %   Eosinophils Absolute 0.2  0.0 - 0.7 K/uL  Basophils Relative 0  0 - 1 %   Basophils Absolute 0.0  0.0 - 0.1 K/uL  COMPREHENSIVE METABOLIC PANEL     Status: Abnormal   Collection Time    08/08/12 10:55 PM      Result Value Range   Sodium 133 (*) 135 - 145 mEq/L   Potassium 5.4 (*) 3.5 - 5.1 mEq/L   Chloride 102  96 - 112 mEq/L   CO2 19  19 - 32 mEq/L   Glucose, Bld 135 (*) 70 - 99 mg/dL   BUN 62 (*) 6 - 23 mg/dL   Creatinine, Ser 1.61 (*) 0.50 - 1.10 mg/dL   Calcium 9.4  8.4 - 09.6  mg/dL   Total Protein 8.3  6.0 - 8.3 g/dL   Albumin 3.5  3.5 - 5.2 g/dL   AST 22  0 - 37 U/L   ALT 16  0 - 35 U/L   Alkaline Phosphatase 70  39 - 117 U/L   Total Bilirubin 0.2 (*) 0.3 - 1.2 mg/dL   GFR calc non Af Amer 21 (*) >90 mL/min   GFR calc Af Amer 24 (*) >90 mL/min  URINALYSIS, ROUTINE W REFLEX MICROSCOPIC     Status: Abnormal   Collection Time    08/09/12  3:22 AM      Result Value Range   Color, Urine YELLOW  YELLOW   APPearance CLOUDY (*) CLEAR   Specific Gravity, Urine 1.014  1.005 - 1.030   pH 5.5  5.0 - 8.0   Glucose, UA NEGATIVE  NEGATIVE mg/dL   Hgb urine dipstick NEGATIVE  NEGATIVE   Bilirubin Urine NEGATIVE  NEGATIVE   Ketones, ur NEGATIVE  NEGATIVE mg/dL   Protein, ur NEGATIVE  NEGATIVE mg/dL   Urobilinogen, UA 0.2  0.0 - 1.0 mg/dL   Nitrite NEGATIVE  NEGATIVE   Leukocytes, UA TRACE (*) NEGATIVE  URINE MICROSCOPIC-ADD ON     Status: None   Collection Time    08/09/12  3:22 AM      Result Value Range   Squamous Epithelial / LPF RARE  RARE   WBC, UA 0-2  <3 WBC/hpf   Bacteria, UA RARE  RARE   Urine-Other AMORPHOUS MATERIAL PRESENT        MDM  Patient with recurrent urinary retention. Relieved with Foley cath. No signs of UTI on U/A. We will d/c with leg bag and dtr will schedule f/u with urologist. Patient is also noted to be mildly hyperkalemic and is not receiving any potassium supplement.  We will tx with dose of Kayexelate. Patient to f/u with PCP for recheck of K on Monday. Dtr will arrange for follow ups.         Brandt Loosen, MD 08/09/12 [phone]

## 2012-08-09 NOTE — ED Notes (Signed)
NAD noted at time of d/c home 

## 2012-08-12 ENCOUNTER — Telehealth: Payer: Self-pay | Admitting: Family Medicine

## 2012-08-12 DIAGNOSIS — M546 Pain in thoracic spine: Secondary | ICD-10-CM

## 2012-08-12 MED ORDER — DICLOFENAC SODIUM 1 % TD GEL: 2.0000 g | Freq: Four times a day (QID) | TRANSDERMAL | Status: DC

## 2012-08-12 NOTE — Telephone Encounter (Signed)
Pt.s daughter notified.  Cathyann Kilfoyle, Darlyne Russian, CMA

## 2012-08-12 NOTE — Telephone Encounter (Signed)
Spoke with patient's daughter.  Pt is unable to eat and drink due to nausea.  Has had medication for nausea recently with success in the past.  Spoke with Dr. Louanne Belton, will schedule appt for pt tomorrow 08/13/2012 with Dr. Louanne Belton and I will call in Zofran 4mg  one by mouth every 6 hours prn nausea disp#30 with 0 rfs.  Pt's daughter notified.  Jersey Ravenscroft, Darlyne Russian, CMA

## 2012-08-12 NOTE — Telephone Encounter (Signed)
Pt is very nauseated. She cant eat anything.  She is drinking and keeping it down. Thinks it is a medicine that is making her sick Please advise

## 2012-08-12 NOTE — Telephone Encounter (Signed)
Due to insurance denial of lidocaine patch, I have prescribed Voltaren gel to be applied to painful area. Please call patient and make her aware there will be a prescription available for pick up.

## 2012-08-13 ENCOUNTER — Ambulatory Visit: Payer: Medicaid Other | Admitting: Family Medicine

## 2012-08-13 ENCOUNTER — Telehealth: Payer: Self-pay | Admitting: *Deleted

## 2012-08-13 NOTE — Telephone Encounter (Signed)
Yvette [name], nurse with home health saw patient this morning and pt complains of left upper quadrant  (Under her breast) pain and had a dark, almost black stool this morning.  Pt has appt with Dr. Louanne Belton today, will fwd msg to MD.  Radene Ou, CMA

## 2012-08-16 ENCOUNTER — Encounter (HOSPITAL_COMMUNITY): Payer: Self-pay | Admitting: *Deleted

## 2012-08-16 ENCOUNTER — Inpatient Hospital Stay (HOSPITAL_COMMUNITY)
Admission: EM | Admit: 2012-08-16 | Discharge: 2012-08-17 | DRG: 698 | Disposition: A | Discharge status: Home / Self Care | Payer: Medicaid Other | Attending: Family Medicine | Admitting: Family Medicine

## 2012-08-16 DIAGNOSIS — R5381 Other malaise: Secondary | ICD-10-CM | POA: Diagnosis present

## 2012-08-16 DIAGNOSIS — E860 Dehydration: Secondary | ICD-10-CM | POA: Diagnosis present

## 2012-08-16 DIAGNOSIS — IMO0002 Reserved for concepts with insufficient information to code with codable children: Secondary | ICD-10-CM

## 2012-08-16 DIAGNOSIS — I509 Heart failure, unspecified: Secondary | ICD-10-CM | POA: Diagnosis present

## 2012-08-16 DIAGNOSIS — I4892 Unspecified atrial flutter: Secondary | ICD-10-CM | POA: Diagnosis present

## 2012-08-16 DIAGNOSIS — Y846 Urinary catheterization as the cause of abnormal reaction of the patient, or of later complication, without mention of misadventure at the time of the procedure: Secondary | ICD-10-CM | POA: Diagnosis present

## 2012-08-16 DIAGNOSIS — E46 Unspecified protein-calorie malnutrition: Secondary | ICD-10-CM | POA: Diagnosis present

## 2012-08-16 DIAGNOSIS — N184 Chronic kidney disease, stage 4 (severe): Secondary | ICD-10-CM | POA: Diagnosis present

## 2012-08-16 DIAGNOSIS — N1330 Unspecified hydronephrosis: Secondary | ICD-10-CM | POA: Diagnosis present

## 2012-08-16 DIAGNOSIS — K859 Acute pancreatitis without necrosis or infection, unspecified: Secondary | ICD-10-CM | POA: Diagnosis present

## 2012-08-16 DIAGNOSIS — Z8673 Personal history of transient ischemic attack (TIA), and cerebral infarction without residual deficits: Secondary | ICD-10-CM

## 2012-08-16 DIAGNOSIS — I252 Old myocardial infarction: Secondary | ICD-10-CM

## 2012-08-16 DIAGNOSIS — M8448XA Pathological fracture, other site, initial encounter for fracture: Secondary | ICD-10-CM | POA: Diagnosis present

## 2012-08-16 DIAGNOSIS — I5032 Chronic diastolic (congestive) heart failure: Secondary | ICD-10-CM | POA: Diagnosis present

## 2012-08-16 DIAGNOSIS — I495 Sick sinus syndrome: Secondary | ICD-10-CM | POA: Diagnosis present

## 2012-08-16 DIAGNOSIS — I2510 Atherosclerotic heart disease of native coronary artery without angina pectoris: Secondary | ICD-10-CM | POA: Diagnosis present

## 2012-08-16 DIAGNOSIS — I10 Essential (primary) hypertension: Secondary | ICD-10-CM | POA: Diagnosis present

## 2012-08-16 DIAGNOSIS — R109 Unspecified abdominal pain: Secondary | ICD-10-CM | POA: Diagnosis present

## 2012-08-16 DIAGNOSIS — R1012 Left upper quadrant pain: Secondary | ICD-10-CM | POA: Diagnosis present

## 2012-08-16 DIAGNOSIS — F411 Generalized anxiety disorder: Secondary | ICD-10-CM | POA: Diagnosis present

## 2012-08-16 DIAGNOSIS — R531 Weakness: Secondary | ICD-10-CM | POA: Diagnosis present

## 2012-08-16 DIAGNOSIS — T83091A Other mechanical complication of indwelling urethral catheter, initial encounter: Principal | ICD-10-CM | POA: Diagnosis present

## 2012-08-16 DIAGNOSIS — N183 Chronic kidney disease, stage 3 unspecified: Secondary | ICD-10-CM | POA: Diagnosis present

## 2012-08-16 DIAGNOSIS — N390 Urinary tract infection, site not specified: Secondary | ICD-10-CM | POA: Diagnosis present

## 2012-08-16 DIAGNOSIS — R339 Retention of urine, unspecified: Secondary | ICD-10-CM | POA: Diagnosis present

## 2012-08-16 DIAGNOSIS — Z515 Encounter for palliative care: Secondary | ICD-10-CM

## 2012-08-16 DIAGNOSIS — I129 Hypertensive chronic kidney disease with stage 1 through stage 4 chronic kidney disease, or unspecified chronic kidney disease: Secondary | ICD-10-CM | POA: Diagnosis present

## 2012-08-16 DIAGNOSIS — T83511A Infection and inflammatory reaction due to indwelling urethral catheter, initial encounter: Secondary | ICD-10-CM | POA: Diagnosis present

## 2012-08-16 DIAGNOSIS — R748 Abnormal levels of other serum enzymes: Secondary | ICD-10-CM | POA: Diagnosis present

## 2012-08-16 LAB — URINE MICROSCOPIC-ADD ON

## 2012-08-16 LAB — CBC WITH DIFFERENTIAL/PLATELET
Eosinophils Absolute: 0.1 10*3/uL (ref 0.0–0.7)
Hemoglobin: 13.3 g/dL (ref 12.0–15.0)
Lymphocytes Relative: 18 % (ref 12–46)
Lymphs Abs: 1.4 10*3/uL (ref 0.7–4.0)
MCH: 30.6 pg (ref 26.0–34.0)
Monocytes Relative: 6 % (ref 3–12)
Neutrophils Relative %: 74 % (ref 43–77)
RBC: 4.35 MIL/uL (ref 3.87–5.11)
WBC: 8 10*3/uL (ref 4.0–10.5)

## 2012-08-16 LAB — COMPREHENSIVE METABOLIC PANEL
ALT: 18 U/L (ref 0–35)
Albumin: 3.2 g/dL — ABNORMAL LOW (ref 3.5–5.2)
Alkaline Phosphatase: 62 U/L (ref 39–117)
Potassium: 5.1 mEq/L (ref 3.5–5.1)
Sodium: 130 mEq/L — ABNORMAL LOW (ref 135–145)
Total Protein: 7.8 g/dL (ref 6.0–8.3)

## 2012-08-16 LAB — LIPASE, BLOOD: Lipase: 508 U/L — ABNORMAL HIGH (ref 11–59)

## 2012-08-16 LAB — URINALYSIS, ROUTINE W REFLEX MICROSCOPIC
Bilirubin Urine: NEGATIVE
Hgb urine dipstick: NEGATIVE
Nitrite: NEGATIVE
Specific Gravity, Urine: 1.01 (ref 1.005–1.030)
pH: 5.5 (ref 5.0–8.0)

## 2012-08-16 MED ORDER — ONDANSETRON HCL 4 MG/2ML IJ SOLN
4.0000 mg | Freq: Once | INTRAMUSCULAR | Status: AC
  Administered 2012-08-16: 4 mg via INTRAVENOUS
  Filled 2012-08-16: qty 2

## 2012-08-16 MED ORDER — SODIUM CHLORIDE 0.9 % IV SOLN: 1000.0000 mL | INTRAVENOUS | Status: DC

## 2012-08-16 MED ORDER — HYDROMORPHONE HCL PF 1 MG/ML IJ SOLN
0.5000 mg | INTRAMUSCULAR | Status: DC | PRN
  Administered 2012-08-16: 0.5 mg via INTRAVENOUS
  Filled 2012-08-16: qty 1

## 2012-08-16 MED ORDER — CEFTRIAXONE SODIUM 1 G IJ SOLR
1.0000 g | INTRAMUSCULAR | Status: DC
  Administered 2012-08-16: 1 g via INTRAVENOUS
  Filled 2012-08-16 (×2): qty 10

## 2012-08-16 NOTE — ED Notes (Addendum)
Pt/family updated, family reports "she feels/looks better", eating bread given to her by family, family asked not to feed pt at this time. Pt sleepy/lethargic, prefers eyes closed, and interactive, cooperative, following directions, EDP into room.

## 2012-08-16 NOTE — ED Notes (Signed)
Admitting Dr. Berline Chough at [facility], family x2 at [facility], meds given.

## 2012-08-16 NOTE — ED Provider Notes (Signed)
History    CSN: [id number] Arrival date & time 08/16/12  4098 First MD Initiated Contact with Patient 08/16/12 1816     Chief Complaint  Patient presents with  . Abdominal Pain   HPI The patient presents to the emergency room with complaints of abdominal pain and decreased urine output. Patient was recently seen in the emergency department. Family states  That they placed a Foley catheter in she was sent home with one. Today the family noticed that she has not been draining a lot of urine out of the catheter. She feels like her lower abdomen is now distended and bloated. She also has been making some urine around the catheter.  Patient has not had any fevers although she did start having an episode of nausea vomiting on the way over. Daughter states that that could be related to motion sickness because she often has bad and the daughter was driving over fast. Past Medical History  Diagnosis Date  . Paroxysmal atrial flutter [facility] admission 2/5-04/20/2010    High fall risk-not a Coumadin candidate  . Hypertension   . Chronic diastolic heart failure     a. Echo February 2012: EF 55%; moderate LVH; mild AI; mild MR; PASP 39  . Sick sinus syndrome     a. Limits use of beta blocker-pindolol tolerated  . NSTEMI (non-ST elevated myocardial infarction)     a. Troponin 0.12, 0.13, 0.08 during admission 04/2010 - conservative mgmt.  . Arthritis   . Anxiety   . Hydronephrosis     Chronic  . CHF (congestive heart failure)   . Chronic back pain   . Stroke     Past Surgical History  Procedure Laterality Date  . Esophagogastroduodenoscopy  04/19/2011    Procedure: ESOPHAGOGASTRODUODENOSCOPY (EGD);  Surgeon: Erick Blinks, MD;  Location: [facility] ENDOSCOPY;  Service: Gastroenterology;  Laterality: N/A;    Family History  Problem Relation Age of Onset  . CAD Neg Hx     History  Substance Use Topics  . Smoking status: Never Smoker   . Smokeless tobacco: Never Used  . Alcohol Use: No    OB History    Grav Para Term Preterm Abortions TAB SAB Ect Mult Living                  Review of Systems  All other systems reviewed and are negative.    Allergies  Review of patient's allergies indicates no known allergies.  Home Medications   Current Outpatient Rx  Name  Route  Sig  Dispense  Refill  . bisacodyl (DULCOLAX) 10 MG suppository   Rectal   Place 10 mg rectally daily as needed for constipation.         . calcitonin, salmon, (MIACALCIN/FORTICAL) 200 UNIT/ACT nasal spray   Nasal   Place 1 spray into the nose daily.   3.7 mL   12   . diclofenac sodium (VOLTAREN) 1 % GEL   Topical   Apply 2 g topically 4 (four) times daily.   1 Tube   3   . hydrALAZINE (APRESOLINE) 50 MG tablet   Oral   Take 1 tablet (50 mg total) by mouth 4 (four) times daily.   120 tablet   3   . lidocaine (LIDODERM) 5 %   Transdermal   Place 1 patch onto the skin daily. Remove & Discard patch within 12 hours or as directed by MD   30 patch   0   . Lysine 500 MG CAPS  Oral   Take 500 mg by mouth daily.          . Melatonin 5 MG TABS   Oral   Take 10 mg by mouth at bedtime.          . Multiple Vitamin (MULTIVITAMIN) tablet   Oral   Take 1 tablet by mouth daily.         . nitroGLYCERIN (NITROSTAT) 0.4 MG SL tablet   Sublingual   Place 0.4 mg under the tongue every 5 (five) minutes as needed for chest pain.         . Omega-3 Fatty Acids (FISH OIL PO)   Oral   Take 1 capsule by mouth daily.         Marland Kitchen oxyCODONE (OXY IR/ROXICODONE) 5 MG immediate release tablet   Oral   Take 5 mg by mouth every 4 (four) hours as needed for pain.         . OxyCODONE (OXYCONTIN) 10 mg T12A   Oral   Take 1 tablet (10 mg total) by mouth every 12 (twelve) hours.   60 tablet   0   . pantoprazole (PROTONIX) 40 MG tablet   Oral   Take 40 mg by mouth daily.         . pindolol (VISKEN) 5 MG tablet   Oral   Take 2.5-5 mg by mouth 2 (two) times daily. Take 5mg  in the am and 2.5mg  in the  pm         . senna (SENOKOT) 8.6 MG TABS   Oral   Take 2 tablets (17.2 mg total) by mouth daily.   60 each   3   . torsemide (DEMADEX) 20 MG tablet   Oral   Take 10 mg by mouth every Tuesday, Thursday, and Saturday at 6 PM.         . traMADol (ULTRAM) 50 MG tablet   Oral   Take 50 mg by mouth every 6 (six) hours as needed for pain.         Marland Kitchen trimethoprim (TRIMPEX) 100 MG tablet   Oral   Take 0.5 tablets (50 mg total) by mouth daily.   30 tablet   0     BP 137/73  Pulse 76  Temp(Src) 97.7 F (36.5 C) (Oral)  Resp 16  SpO2 94%  Physical Exam  Nursing note and vitals reviewed. Constitutional:  Frail, elderly, uncomfortable appearing  HENT:  Head: Normocephalic and atraumatic.  Right Ear: External ear normal.  Left Ear: External ear normal.  Eyes: Conjunctivae are normal. Right eye exhibits no discharge. Left eye exhibits no discharge. No scleral icterus.  Neck: Neck supple. No tracheal deviation present.  Cardiovascular: Normal rate, regular rhythm and intact distal pulses.   Pulmonary/Chest: Effort normal and breath sounds normal. No stridor. No respiratory distress. She has no wheezes. She has no rales.  Abdominal: Soft. Bowel sounds are normal. She exhibits no distension. There is tenderness in the suprapubic area. There is no rigidity, no rebound and no guarding.  Genitourinary:  Foley catheter is in place, yellow urine with particular matter   Musculoskeletal: She exhibits no edema and no tenderness.  Neurological: She is alert. She has normal strength. No sensory deficit. Cranial nerve deficit:  no gross defecits noted. She exhibits normal muscle tone. She displays no seizure activity. Coordination normal.  Skin: Skin is warm and dry. No rash noted. She is not diaphoretic.  Psychiatric: She has a normal mood and affect.  ED Course  Procedures (including critical care time) Bladder scan was performed Patient is greater Dorie 900 cc of urine in her  bladder We will replace the patient's Foley catheter with a larger caliber catheter  Medications  ondansetron (ZOFRAN) injection 4 mg (not administered)  HYDROmorphone (DILAUDID) injection 0.5 mg (not administered)  cefTRIAXone (ROCEPHIN) 1 g in dextrose 5 % 50 mL IVPB (not administered)    Labs Reviewed  COMPREHENSIVE METABOLIC PANEL - Abnormal; Notable for the following:    Sodium 130 (*)    Glucose, Bld 139 (*)    BUN 34 (*)    Creatinine, Ser 1.43 (*)    Albumin 3.2 (*)    Total Bilirubin 0.2 (*)    GFR calc non Af Amer 30 (*)    GFR calc Af Amer 35 (*)    All other components within normal limits  LIPASE, BLOOD - Abnormal; Notable for the following:    Lipase 508 (*)    All other components within normal limits  URINALYSIS, ROUTINE W REFLEX MICROSCOPIC - Abnormal; Notable for the following:    APPearance HAZY (*)    Leukocytes, UA MODERATE (*)    All other components within normal limits  URINE MICROSCOPIC-ADD ON - Abnormal; Notable for the following:    Bacteria, UA FEW (*)    All other components within normal limits  URINE CULTURE  CBC WITH DIFFERENTIAL   No results found.   1. Pancreatitis   2. Urinary retention   3. UTI (lower urinary tract infection)       MDM  Patient's symptoms improved significantly after her Foley catheter was placed. However she continued to have discomfort in her mid abdomen.  Patient's lipase is elevated which is consistent with pancreatitis.  I reviewed her previous imaging tests in the last month and there was no abnormality noted on her MRI and CT scans regarding her pancreas.  The patient continued IV fluids. I will start her on antibiotics for urinary tract infection. She'll be admitted to the hospital for further treatment.       Celene Kras, MD 08/16/12 [phone]

## 2012-08-16 NOTE — ED Notes (Signed)
Bladder scan showed greater Yvette [name] 878 ML

## 2012-08-16 NOTE — ED Notes (Signed)
Family reports pt was seen at ED recently and dx with renal failure. Went home with foley cath in place. Family reports decrease in urine output today and pt having abd pain and distention. N/v on arrival to ed. Pt moaning in pain.

## 2012-08-17 ENCOUNTER — Inpatient Hospital Stay (HOSPITAL_COMMUNITY): Payer: Medicaid Other

## 2012-08-17 DIAGNOSIS — K859 Acute pancreatitis without necrosis or infection, unspecified: Secondary | ICD-10-CM | POA: Diagnosis present

## 2012-08-17 DIAGNOSIS — R1012 Left upper quadrant pain: Secondary | ICD-10-CM

## 2012-08-17 LAB — CBC
HCT: 36.5 % (ref 36.0–46.0)
MCH: 30.1 pg (ref 26.0–34.0)
MCV: 92.4 fL (ref 78.0–100.0)
Platelets: 231 10*3/uL (ref 150–400)
RDW: 12.3 % (ref 11.5–15.5)

## 2012-08-17 LAB — BASIC METABOLIC PANEL
CO2: 20 mEq/L (ref 19–32)
Calcium: 8.5 mg/dL (ref 8.4–10.5)
Chloride: 103 mEq/L (ref 96–112)
Creatinine, Ser: 1.35 mg/dL — ABNORMAL HIGH (ref 0.50–1.10)
Glucose, Bld: 102 mg/dL — ABNORMAL HIGH (ref 70–99)
Sodium: 132 mEq/L — ABNORMAL LOW (ref 135–145)

## 2012-08-17 MED ORDER — HEPARIN SODIUM (PORCINE) 5000 UNIT/ML IJ SOLN
5000.0000 [IU] | Freq: Three times a day (TID) | INTRAMUSCULAR | Status: DC
  Administered 2012-08-17 (×2): 5000 [IU] via SUBCUTANEOUS
  Filled 2012-08-17 (×4): qty 1

## 2012-08-17 MED ORDER — HYDRALAZINE HCL 50 MG PO TABS
50.0000 mg | ORAL_TABLET | Freq: Four times a day (QID) | ORAL | Status: DC
  Administered 2012-08-17 (×2): 50 mg via ORAL
  Filled 2012-08-17 (×5): qty 1

## 2012-08-17 MED ORDER — CALCITONIN (SALMON) 200 UNIT/ACT NA SOLN
1.0000 | Freq: Every day | NASAL | Status: DC
  Administered 2012-08-17: 1 via NASAL
  Filled 2012-08-17: qty 3.7

## 2012-08-17 MED ORDER — ONDANSETRON HCL 4 MG PO TABS: 4.0000 mg | ORAL_TABLET | Freq: Three times a day (TID) | ORAL | Status: DC | PRN

## 2012-08-17 MED ORDER — SODIUM CHLORIDE 0.9 % IJ SOLN
3.0000 mL | Freq: Two times a day (BID) | INTRAMUSCULAR | Status: DC
  Administered 2012-08-17: 3 mL via INTRAVENOUS

## 2012-08-17 MED ORDER — CEPHALEXIN 500 MG PO CAPS: 500.0000 mg | ORAL_CAPSULE | Freq: Three times a day (TID) | ORAL | Status: DC

## 2012-08-17 MED ORDER — PINDOLOL 5 MG PO TABS
5.0000 mg | ORAL_TABLET | Freq: Every day | ORAL | Status: DC
  Administered 2012-08-17: 5 mg via ORAL
  Filled 2012-08-17: qty 1

## 2012-08-17 MED ORDER — TORSEMIDE 10 MG PO TABS
10.0000 mg | ORAL_TABLET | ORAL | Status: DC
  Filled 2012-08-17: qty 1

## 2012-08-17 MED ORDER — PINDOLOL 5 MG PO TABS: 2.5000 mg | ORAL_TABLET | Freq: Two times a day (BID) | ORAL | Status: DC

## 2012-08-17 MED ORDER — LIDOCAINE 5 % EX PTCH
1.0000 | MEDICATED_PATCH | CUTANEOUS | Status: DC
  Administered 2012-08-17: 1 via TRANSDERMAL
  Filled 2012-08-17: qty 1

## 2012-08-17 MED ORDER — SENNA 8.6 MG PO TABS
2.0000 | ORAL_TABLET | Freq: Every day | ORAL | Status: DC
  Administered 2012-08-17: 17.2 mg via ORAL
  Filled 2012-08-17: qty 2

## 2012-08-17 MED ORDER — SODIUM CHLORIDE 0.9 % IV SOLN: INTRAVENOUS | Status: DC

## 2012-08-17 MED ORDER — PINDOLOL 5 MG PO TABS
2.5000 mg | ORAL_TABLET | Freq: Every day | ORAL | Status: DC
  Filled 2012-08-17 (×2): qty 1

## 2012-08-17 MED ORDER — TRIMETHOPRIM 100 MG PO TABS
50.0000 mg | ORAL_TABLET | Freq: Every day | ORAL | Status: DC
  Administered 2012-08-17: 11:00:00 via ORAL
  Filled 2012-08-17: qty 1

## 2012-08-17 NOTE — Progress Notes (Signed)
Pt admitted to the unit via stretcher from the ED. Pt does not speak english. Pt Grandaughter at the bedside for pertinent history and information. Pt speaks vietnamese. Pt denies pain per granddaughter. Initiated on telemetry per order. Reviewed safety plan with family and pt NPO status. Will continue to monitor. Dondra Spry

## 2012-08-17 NOTE — H&P (Signed)
Family Medicine Teaching Service Attending Note  I interviewed and examined patient Yvette [name] and reviewed their tests and x-rays.  I discussed with Dr. Berline Chough and reviewed their note for today.  I agree with their assessment and plan.     Additionally  Alert in no acute distress Family feels is not any any severe pain but is irritated by her foley Wonder if this is truly pancreatitis given no nausea or vomiting  Would ensure adequate pain control and good bladder emptying Do not feel any further evaluation is necessary given previous work up

## 2012-08-17 NOTE — Discharge Summary (Signed)
Discharge Summary 08/17/2012 6:51 PM  Ricka Harl DOB: [birth year] MRN: [id number]  Date of Admission: 08/16/2012 Date of Discharge: 08/17/2012  PCP: Majel Homer, MD Consultants: none  Reason for Admission: abdominal pain  Discharge Diagnosis Primary 1. Urinary retention secondary to foley obstruction 2. Elevated lipase without clinical signs of pancreatitis Secondary 1. Pain management for compression fractures 2. Hypertension 3. Diastolic heart failure, without exacerbation  Hospital Course: 79 y.o. year old female presenting with acute worsening of chronic abdominal pain of unclear etiology. Found to have an elevated lipase and concerns for pancreatitis. She was also found to have significant urinary retention and urinary tract infection in the setting of a clogged Foley catheter.  1. Urinary retention: This was the likely cause of her acute abdominal pain given that the pain resolved with unclogging the foley.  UA was concerning for UTI despite being on prophylactic bactrim at home.  She received one dose of ceftriaxone in the ED and was discharge with 2 days of Keflex for possible UTI.  Urine culture is pending at time of discharge.  She has follow up with urology on Monday.  2. Elevated lipase: Lipase was 502 on admission.  She does have some baseline nausea at home, but this has not worsening and she has had no vomiting or epigastric pain to indicate clinical pancreatitis.  She tolerated fluids and crackers with mild nausea only (which daughter states is normal) prior to discharge.  Will give some zofran to use prn at home.  3. Pain from vertebral compression fractures: She is on calcitonin, oxycontin, and oxycodone at home.  She received a one time dose of dilaudid in the ED, but no further pain medications.  At time of discharge she denies any pain.  Recommended stopping oxycontin and just using oxycodone prn.  4. Hypertension:  Blood pressures remained well controlled this admission  on home pindolol.  5. Diastolic heart failure: No evidence for exacerbation.  Her home torsemide was held and she received gentle hydration during the day.  Resume home dose of torsemide at discharge.  6. CAD: No chest pain this admission.  Continued home medications.  Procedures: none  Discharge Medications   Medication List    STOP taking these medications       OxyCODONE 10 mg T12a  Commonly known as:  OXYCONTIN      TAKE these medications       bisacodyl 10 MG suppository  Commonly known as:  DULCOLAX  Place 10 mg rectally daily as needed for constipation.     calcitonin (salmon) 200 UNIT/ACT nasal spray  Commonly known as:  MIACALCIN/FORTICAL  Place 1 spray into the nose daily.     cephALEXin 500 MG capsule  Commonly known as:  KEFLEX  Take 1 capsule (500 mg total) by mouth 3 (three) times daily.     diclofenac sodium 1 % Gel  Commonly known as:  VOLTAREN  Apply 2 g topically 4 (four) times daily.     FISH OIL PO  Take 1 capsule by mouth daily.     hydrALAZINE 50 MG tablet  Commonly known as:  APRESOLINE  Take 1 tablet (50 mg total) by mouth 4 (four) times daily.     lidocaine 5 %  Commonly known as:  LIDODERM  Place 1 patch onto the skin daily. Remove & Discard patch within 12 hours or as directed by MD     Lysine 500 MG Caps  Take 500 mg by mouth daily.  Melatonin 5 MG Tabs  Take 10 mg by mouth at bedtime.     multivitamin tablet  Take 1 tablet by mouth daily.     nitroGLYCERIN 0.4 MG SL tablet  Commonly known as:  NITROSTAT  Place 0.4 mg under the tongue every 5 (five) minutes as needed for chest pain.     ondansetron 4 MG tablet  Commonly known as:  ZOFRAN  Take 1 tablet (4 mg total) by mouth every 8 (eight) hours as needed for nausea.     oxyCODONE 5 MG immediate release tablet  Commonly known as:  Oxy IR/ROXICODONE  Take 5 mg by mouth every 4 (four) hours as needed for pain.     pantoprazole 40 MG tablet  Commonly known as:  PROTONIX   Take 40 mg by mouth daily.     pindolol 5 MG tablet  Commonly known as:  VISKEN  Take 2.5-5 mg by mouth 2 (two) times daily. Take 5mg  in the am and 2.5mg  in the pm     senna 8.6 MG Tabs  Commonly known as:  SENOKOT  Take 2 tablets (17.2 mg total) by mouth daily.     torsemide 20 MG tablet  Commonly known as:  DEMADEX  Take 10 mg by mouth every Tuesday, Thursday, and Saturday at 6 PM.     traMADol 50 MG tablet  Commonly known as:  ULTRAM  Take 50 mg by mouth every 6 (six) hours as needed for pain.     trimethoprim 100 MG tablet  Commonly known as:  TRIMPEX  Take 0.5 tablets (50 mg total) by mouth daily.        Pertinent Hospital Labs BMET    Component Value Date/Time   NA 132* 08/17/2012 0416   K 4.7 08/17/2012 0416   CL 103 08/17/2012 0416   CO2 20 08/17/2012 0416   GLUCOSE 102* 08/17/2012 0416   BUN 28* 08/17/2012 0416   CREATININE 1.35* 08/17/2012 0416   CREATININE 1.17* 07/15/2012 1613   CALCIUM 8.5 08/17/2012 0416   GFRNONAA 32* 08/17/2012 0416   GFRAA 38* 08/17/2012 0416   Lipase     Component Value Date/Time   LIPASE 508* 08/16/2012 1915   Chest x-ray  1. No acute cardiopulmonary disease  2. Extensive background chronic lung disease including emphysema,  bronchitic changes and possible early pulmonary fibrosis  3. Stable cardiomegaly with left atrial and left ventricular  enlargement  4. Aortic atherosclerosis  Discharge instructions: see AVS  Condition at discharge: stable  Disposition: stable  Pending Tests: urine culture  Follow up:     Follow-up Information   Call RITCH,ERIK, MD. (for an appointment this week)    Contact information:   [address] [place] [address] [phone]       Follow up Issues:  1. Please recheck BMP and lipase 2. Follow up urine culture - received CTX x1 and discharged with 2 days of keflex 3. Re-assess pain as I recommended stopping the oxcontin and just using oxycodone prn  BOOTH, Kalev Temme 08/17/2012, 7:23  PM

## 2012-08-17 NOTE — Progress Notes (Signed)
Interim Progress Note S: I spoke with multiple family members at the patient's bedside.  Per family report, she is doing very well and denies any pain currently.  Daughter states that she has some nausea all the time at home, but thinks this is related to her pain medications.  Family states that she is at her baseline and are comfortable with discharge home this evening.  O: BP 131/80  Pulse 87  Temp(Src) 97.8 F (36.6 C) (Oral)  Resp 18  Ht 4\' 9"  (1.448 m)  Wt 91 lb 7.9 oz (41.5 kg)  BMI 19.79 kg/m2  SpO2 94% Gen: sleeping comfortably, not in any distress Abd: +BS, soft, NTND, clear urine in foley bag  A/P: 79 yo F with multiple medical problems admitted for abdominal pain and found to have clogged foley and elevated lipase.  # Abdominal Pain: Likely secondary to urinary obstruction and distended bladder given rapid resolution with new foley catheter.  No evidence for pathology on recent extensive abdominal work up.  Lipase is elevated this admission; however, she is not complaining of epigastric pain, nausea or vomiting.   - will trial PO with crackers and water   # Foley obstruction: Resolved. - UA concerned for UTI, but may just be from acute obstruction - s/p Ceftriaxone x1 - will continue treatment with PO Keflex x2 additional days or until culture comes back  # Pain from multiple compression fractures: On narcotics at home.  Admission pain was different from this pain.  Currently pain free and she has not received any pain medication since 10pm last night. - will recommend that family stop oxycontin and just use oxycodone prn  # CKD, stage III: Baseline during admission last month was around 1.4 after AKI from obstruction.  Creatinine currently at baseline.  Electrolytes are within normal limits except for a slightly low sodium, but this improving.    # CAD/HTN/dCHF: No evidence for acute exacerbation or any cardiac process currently.   - holding torsemide due concerns for  dehydration on admission; okay to restart at discharge  Discussed discharge with daughter, who is primary care giver.  She agrees that patient is at her baseline.  I discussed that we will try some crackers and water.  If she tolerates PO well without return of severe pain, nausea, or vomiting, okay for discharge this evening.  Encouraged follow up with PCP in the next week for repeat labs and follow up of pain.  BOOTH, Ming Mcmannis 08/17/2012, 5:57 PM

## 2012-08-17 NOTE — H&P (Signed)
Family Medicine Teaching Service Admission H&P Service Pager: [phone]  Patient name: Yvette [name] Medical record number: [id number] Date of birth: 12-Jun-1916 Age: 79 y.o. Gender: female  Primary Care Provider: Majel Homer, MD Attending Physician: Carney Living, MD Consultants:   CODE STATUS: FULL CODE  BRIEF PATIENT OVERVIEW & DISPOSITION  79 y.o. year old female presenting with acute worsening of chronic abdominal pain of unclear etiology.  Found to have an elevated lipase and concerns for pancreatitis.  She was also found to have significant urinary retention and urinary tract infection in the setting of a clogged Foley catheter.  Plan to admit to telemetry, n.p.o. and pain control.   Active Hospital Problems   Diagnosis Date Noted  . Pancreatitis, acute 08/17/2012  . Vertebral fracture, pathological 07/28/2012  . UTI (urinary tract infection) 07/25/2012  . Urinary retention 07/25/2012  . Hydronephrosis 07/25/2012  . CAD (coronary artery disease) 07/25/2012  . Weakness generalized 07/24/2012  . Encounter for palliative care 07/24/2012  . Abdominal pain, left upper quadrant 07/24/2012  . HTN (hypertension) 10/05/2011  . Physical deconditioning 04/26/2011  . CKD (chronic kidney disease), stage III 04/26/2011  . Left sided abdominal pain 04/18/2011  . Paroxysmal atrial flutter   . Hypertension   . Chronic diastolic heart failure   . BRADYCARDIA-TACHYCARDIA SYNDROME 05/02/2010    Resolved Hospital Problems   Diagnosis Date Noted Date Resolved  No resolved problems to display.    CC  Abdominal pain  HPI  Yvette [name] is a 79 y.o. year old female who was brought back to the emergency department by her daughter who is her primary caregiver due to worsening abdominal pain.  The patient has been struggling with pain for quite some time now and has undergone extensive workup including CT, MRI.  Currently the thought is that she does have significant degenerative compression  fractures that are likely contributing to the majority of her pain.  She denies any overt back pain today however given the findings on imaging this would correlate.  The patient is reported to be fairly low functioning at home in his dependent for the majority of her care throughout the day.  This does seem to have worsened generally in the setting of her worsening abdominal pain.  Her abdominal pain radiates from the right epigastrium to the left epigastrium.  Does not radiate to her back.  She has a hard time describing it.  her past medical history is pertinent for: Congestive heart failure, coronary artery disease, hypertension, paroxysmal a flutter, tachybradycardia syndrome.  During this last hospitalization there was a palliative care meeting for goals of care and the family does wish for aggressive medical treatment to prolong the scanctity of life.  She has had an aggressive approach to pain control at home.     ROS  Daughter insists on interpreting, pt is minimally communicative Constitutional  significantly continues to decline and overall   Infectious  no fevers noted   Resp  no cough   Cardiac  denies any chest   GI  denies any nausea or vomiting but stomach pain as above   GU  Foley catheter in place it has been noted to have significant urinary retention up to 900 cc in the bladder   Skin  no down   Psych  minimally interactive   Neuro  no new dysarthria or lateralization of strength   MSK  debilitated and weakend generally  Trauma No falls reported  Diet Decreased po intake lately  MEDS Has  been compliant per pt's daughter but unclear if she understands sigs   HISTORY  PMHx:  Past Medical History  Diagnosis Date  . Paroxysmal atrial flutter [facility] admission 2/5-04/20/2010    High fall risk-not a Coumadin candidate  . Hypertension   . Chronic diastolic heart failure     a. Echo February 2012: EF 55%; moderate LVH; mild AI; mild MR; PASP 39  . Sick sinus syndrome     a.  Limits use of beta blocker-pindolol tolerated  . NSTEMI (non-ST elevated myocardial infarction)     a. Troponin 0.12, 0.13, 0.08 during admission 04/2010 - conservative mgmt.  . Arthritis   . Anxiety   . Hydronephrosis     Chronic  . CHF (congestive heart failure)   . Chronic back pain   . Stroke     PSHx: Past Surgical History  Procedure Laterality Date  . Esophagogastroduodenoscopy  04/19/2011    Procedure: ESOPHAGOGASTRODUODENOSCOPY (EGD);  Surgeon: Erick Blinks, MD;  Location: [facility] ENDOSCOPY;  Service: Gastroenterology;  Laterality: N/A;    Social Hx: History   Social History  . Marital Status: Widowed    Spouse Name: N/A    Number of Children: N/A  . Years of Education: N/A   Occupational History  .     Social History Main Topics  . Smoking status: Never Smoker   . Smokeless tobacco: Never Used  . Alcohol Use: No  . Drug Use: No  . Sexually Active: No   Other Topics Concern  . None   Social History Narrative  . None    Family Hx: Family History  Problem Relation Age of Onset  . CAD Neg Hx     Allergies: No Known Allergies  Home Medications: Prescriptions prior to admission  Medication Sig Dispense Refill  . bisacodyl (DULCOLAX) 10 MG suppository Place 10 mg rectally daily as needed for constipation.      . calcitonin, salmon, (MIACALCIN/FORTICAL) 200 UNIT/ACT nasal spray Place 1 spray into the nose daily.  3.7 mL  12  . diclofenac sodium (VOLTAREN) 1 % GEL Apply 2 g topically 4 (four) times daily.  1 Tube  3  . hydrALAZINE (APRESOLINE) 50 MG tablet Take 1 tablet (50 mg total) by mouth 4 (four) times daily.  120 tablet  3  . lidocaine (LIDODERM) 5 % Place 1 patch onto the skin daily. Remove & Discard patch within 12 hours or as directed by MD  30 patch  0  . Lysine 500 MG CAPS Take 500 mg by mouth daily.       . Melatonin 5 MG TABS Take 10 mg by mouth at bedtime.       . Multiple Vitamin (MULTIVITAMIN) tablet Take 1 tablet by mouth daily.      .  nitroGLYCERIN (NITROSTAT) 0.4 MG SL tablet Place 0.4 mg under the tongue every 5 (five) minutes as needed for chest pain.      . Omega-3 Fatty Acids (FISH OIL PO) Take 1 capsule by mouth daily.      Marland Kitchen oxyCODONE (OXY IR/ROXICODONE) 5 MG immediate release tablet Take 5 mg by mouth every 4 (four) hours as needed for pain.      . OxyCODONE (OXYCONTIN) 10 mg T12A Take 1 tablet (10 mg total) by mouth every 12 (twelve) hours.  60 tablet  0  . pantoprazole (PROTONIX) 40 MG tablet Take 40 mg by mouth daily.      . pindolol (VISKEN) 5 MG tablet Take 2.5-5 mg by mouth  2 (two) times daily. Take 5mg  in the am and 2.5mg  in the pm      . senna (SENOKOT) 8.6 MG TABS Take 2 tablets (17.2 mg total) by mouth daily.  60 each  3  . torsemide (DEMADEX) 20 MG tablet Take 10 mg by mouth every Tuesday, Thursday, and Saturday at 6 PM.      . traMADol (ULTRAM) 50 MG tablet Take 50 mg by mouth every 6 (six) hours as needed for pain.      Marland Kitchen trimethoprim (TRIMPEX) 100 MG tablet Take 0.5 tablets (50 mg total) by mouth daily.  30 tablet  0    OBJECTIVE  Vitals: Temp:  [97.6 F (36.4 C)-98.2 F (36.8 C)] 97.6 F (36.4 C) (06/06 2313) Pulse Rate:  [76-80] 80 (06/06 2313) Resp:  [16] 16 (06/06 2313) BP: (103-143)/(56-73) 143/56 mmHg (06/06 2313) SpO2:  [93 %-94 %] 94 % (06/06 2313) Weight:  [91 lb 7.9 oz (41.5 kg)] 91 lb 7.9 oz (41.5 kg) (06/06 2313)  Weight: Baseline Weight: 107  On 5/15  Filed Weights   08/16/12 2313  Weight: 91 lb 7.9 oz (41.5 kg)  = 16# loss in 3 weeks  I&Os:  Intake/Output Summary (Last 24 hours) at 08/17/12 0528 Last data filed at 08/16/12 2319  Gross per 24 hour  Intake      0 ml  Output   1000 ml  Net  -1000 ml    PE: GENERAL:  Elderly, frail, cachectic  female.  Chronically ill-appearing but resting comfortably in bed no respiratory distress.  Not focally withdrawing from anything.  PSYCH: Insight:Unable to be obtained due to an interpreter  , but appears to be withdrawn H&N:  AT/[place], trachea midline EENT:  MMM, no scleral icterus, EOMi;  HEART: RRR, S1/S2 heard, no murmur LUNGS: CTA B, no wheezes, no crackles ABDOMEN: +BS, soft, non-tender, no rigidity, no guarding, no masses/organomegaly, non focal exam, negative McBurney's, negative Murphy's GU: Chronic indwelling Foley, draining well now EXTREMITIES: Moves only with assistance.  No focal lateralization, warm well perfused, no edema, bilateral DP and PT pulses 2/4.     LABS: Daily Others:   Recent Labs Lab 08/16/12 1915 08/17/12 0104  WBC 8.0 7.2  HGB 13.3 11.9*  HCT 40.8 36.5  PLT 230 231    Recent Labs Lab 08/16/12 1915 08/17/12 0104  NA 130*  --   K 5.1  --   CL 100  --   CO2 24  --   BUN 34*  --   CREATININE 1.43* 1.38*  GLUCOSE 139*  --   CALCIUM 9.2  --      08/16/2012 19:15  Albumin 3.2 (L)  Lipase 508 (H)  AST 22  ALT 18  Total Protein 7.8  Total Bilirubin 0.2 (L)     URINE STUDIES: 6/6 UA - nitrite negative, moderate leukocytes, rare yeast.  MICRO: 6/6 Urine Cx -   IMAGING: 5/17 - CT Chest - compression fx @ T5,T8,T11.  Non specific airspace disease 5/17 - CT Ab/Pel - L Kidney atrophy 2o/2 chronic UPJ obstruction; sigmoid diverticulosis, Liver Lesions (favor benign) 5/19 - MRI Abdomen (w/o contrast) - Hepatic & renal Lesions likely hepatic hemangiomata, limited malignancy eval due to lack of contrast.  Chronic left UPJ obstruction.  ?early PNA on LLL  Gallbladder polyp vs sludge ball or stone, no acute cholecystitis   Medications:   . sodium chloride 100 mL/hr at 08/17/12 0100   . calcitonin (salmon)  1 spray Alternating Nares Daily  .  cefTRIAXone (ROCEPHIN)  IV  1 g Intravenous Q24H  . heparin  5,000 Units Subcutaneous Q8H  . hydrALAZINE  50 mg Oral QID  . lidocaine  1 patch Transdermal Q24H  . pindolol  2.5 mg Oral QHS  . pindolol  5 mg Oral Daily  . senna  2 tablet Oral Daily  . sodium chloride  3 mL Intravenous Q12H  . torsemide  10 mg Oral Q T,Th,Sat-1800   . trimethoprim  50 mg Oral Daily   HYDROmorphone (DILAUDID) injection  Assessment & Plan  LOS: [age] y.o. year old female with worsening chronic abdominal pain.  She has had aggressive outpatient pain management and is now presenting with elevated lipase concerning for pancreatitis in the setting of a 16 pound weight loss in 3 weeks  # Pancreatitis & Hyponatremia: Elevated lipase at admission.  Has not been reported to have been vomiting so this does not coincide.  It is concerning giving the MRI findings of the gallbladder sludge for a potential obstructive pancreatitis secondary to potential gallstone but no exam or lab findings consistent with acute cholecystitis/cholangitis.  Also must consider metastatic process in the setting of the multiple lesions seen on imaging although reassuring in appearance.  Given her history of heart failure fluids will need to be monitored closely and given gingerly. [ ]  f/u fluid status [ ]  f/u nutrition - Hold diuretic  # CAD, HTN, Diastolic CHF: Continue hydralazine andpindolol; monitor BP  # Crackles on Exam: Repeat CXR  No vital sign instability or leukocytosis indicating infection  # Euvolemic if not dry: Monitor and gentle hydration    # Musculoskeletal and pain management: Multiple etiologies for her pain including her compression fractures as well as consideration for metastatic cancer pain although do not have a diagnosis to support this.  She has been on high dose opioids at home but appears to have failed.  We will continue her calcitonin and have started on Dilaudid injections.  Continue Lidoderm patch.  Consider transition to  Fentanyl patch  [ ]  aggressive pain management  # GU: Patient with chronic urinary retention that ultimately led to acute renal failure previously.  Has indwelling catheter at this time that initially was obstructed with sediment.  Foley replaced.  Has been started on antibiotics although this is likely a contaminant.   Okay with continuing until culture returns.  May need anti-yeast treatment however this was likely biofilm  [ ]  f/u culture & rocephin  # Poor Detention:  Markedly poor dentition.   # Inanition: This patient's overall course is very concerning.  Unfortunately we do not have a good etiology for any of the stigmata of her presentation.  We will continue to support her fully at this time however further aggressive medical evaluation and workup may possibly be approaching futility. [ ]  Consider re/consulting palliative care  versus meeting with PCP.  An interpreter should be present to speak directly with the patient.  The granddaughter (surrogate) has been resistant to this previously  --- FEN  *NS100cc >> 50 -NPO until pain improved or lipase better --- PPx: lovenox   Andrena Mews, DO [facility] Resident - PGY-2 08/17/2012 5:28 AM

## 2012-08-18 NOTE — Discharge Summary (Signed)
I have reviewed this discharge summary and agree.    

## 2012-08-19 LAB — URINE CULTURE

## 2012-08-19 NOTE — Progress Notes (Signed)
UR COMPLETED  

## 2012-08-23 ENCOUNTER — Ambulatory Visit (INDEPENDENT_AMBULATORY_CARE_PROVIDER_SITE_OTHER): Payer: Medicaid Other | Admitting: Family Medicine

## 2012-08-23 ENCOUNTER — Encounter: Payer: Self-pay | Admitting: Family Medicine

## 2012-08-23 VITALS — BP 142/54 | HR 85 | Temp 99.4°F | Ht <= 58 in

## 2012-08-23 DIAGNOSIS — K859 Acute pancreatitis without necrosis or infection, unspecified: Secondary | ICD-10-CM

## 2012-08-23 DIAGNOSIS — R5381 Other malaise: Secondary | ICD-10-CM

## 2012-08-23 DIAGNOSIS — R5383 Other fatigue: Secondary | ICD-10-CM

## 2012-08-23 DIAGNOSIS — I10 Essential (primary) hypertension: Secondary | ICD-10-CM

## 2012-08-23 DIAGNOSIS — R339 Retention of urine, unspecified: Secondary | ICD-10-CM

## 2012-08-23 DIAGNOSIS — R7989 Other specified abnormal findings of blood chemistry: Secondary | ICD-10-CM

## 2012-08-23 DIAGNOSIS — R6889 Other general symptoms and signs: Secondary | ICD-10-CM

## 2012-08-23 DIAGNOSIS — IMO0002 Reserved for concepts with insufficient information to code with codable children: Secondary | ICD-10-CM

## 2012-08-23 DIAGNOSIS — R1012 Left upper quadrant pain: Secondary | ICD-10-CM

## 2012-08-23 DIAGNOSIS — R634 Abnormal weight loss: Secondary | ICD-10-CM

## 2012-08-23 LAB — COMPREHENSIVE METABOLIC PANEL
Albumin: 3.4 g/dL — ABNORMAL LOW (ref 3.5–5.2)
Alkaline Phosphatase: 48 U/L (ref 39–117)
BUN: 28 mg/dL — ABNORMAL HIGH (ref 6–23)
CO2: 21 mEq/L (ref 19–32)
Glucose, Bld: 169 mg/dL — ABNORMAL HIGH (ref 70–99)
Potassium: 4.6 mEq/L (ref 3.5–5.3)
Total Protein: 6.9 g/dL (ref 6.0–8.3)

## 2012-08-23 LAB — LIPASE: Lipase: 198 U/L — ABNORMAL HIGH (ref 0–75)

## 2012-08-23 MED ORDER — SENNA 8.6 MG PO TABS: 2.0000 | ORAL_TABLET | Freq: Every day | ORAL | Status: DC

## 2012-08-23 MED ORDER — MEGESTROL ACETATE 40 MG PO TABS: 160.0000 mg/d | ORAL_TABLET | Freq: Every day | ORAL | Status: DC

## 2012-08-23 MED ORDER — OXYCODONE HCL 5 MG PO TABS: 5.0000 mg | ORAL_TABLET | ORAL | Status: DC | PRN

## 2012-08-23 NOTE — Patient Instructions (Addendum)
I want you to take Senokot every day.  You need to have one soft bowel movement every day. I want you to start taking Megace every day. Plan on coming back to see me in 2 weeks

## 2012-08-29 ENCOUNTER — Encounter: Payer: Self-pay | Admitting: Family Medicine

## 2012-08-29 DIAGNOSIS — R634 Abnormal weight loss: Secondary | ICD-10-CM | POA: Insufficient documentation

## 2012-08-29 HISTORY — DX: Abnormal weight loss: R63.4

## 2012-08-29 NOTE — Assessment & Plan Note (Signed)
We should plan to repeat the patient's TSH and free T4 in 1-2 months once the patient is out of the hospital for a significant amount of time.

## 2012-08-29 NOTE — Assessment & Plan Note (Signed)
Unfortunately, due to language barrier, I'm not able to perform a Mini-Mental status exam on this patient. I am not certain if the patient's loss of appetite is related to a mood disorder, or progressive dementia. We will start Megace and monitor weight at least monthly. Patient's weight is improving we will continue it, otherwise we will either increase the dose or stop.

## 2012-08-29 NOTE — Assessment & Plan Note (Signed)
I will continue to follow with urology. If the patient is going to have a chronic indwelling Foley, we'll need to be careful with obtaining urinalyses as she is likely to become colonized and we wish to minimize antibiotic usage possible

## 2012-08-29 NOTE — Assessment & Plan Note (Signed)
Workup has not revealed any significant evidence of malignancy. We will continue oxycodone as needed. I discussed with the patient's daughter the risks of this medication including confusion and falls. We'll be very careful in judicious in the use of this medication.

## 2012-08-29 NOTE — Assessment & Plan Note (Signed)
Recheck lipase today to make sure is continuing to trend down.

## 2012-08-29 NOTE — Assessment & Plan Note (Signed)
We will check basic metabolic panel today

## 2012-08-29 NOTE — Progress Notes (Signed)
Patient ID: Yvette [name], female   DOB: 1916/10/07, 79 y.o.   MRN: [id number] Subjective: The patient is a 79 y.o. year old female who presents today for hospital followup.  1: Pancreatitis: Diagnosed in the hospital. No obvious source. Lipase was trending down as a discharge. She has not had any nausea, vomiting, or significant abdominal discomfort.  2. Renal failure: Creatinine was stable at the time of her discharge but followup on the recommended.  3. Back pain: Felt secondary to compression fractures. There is no obvious malignancy on workup in the hospital. Decision was made to use opioid analgesia for long-term pain management. This was made in combination with palliative care. Is using one or 2 5 mg oxycodone tablets daily.  4. Urinary retention: Patient is a problems with repeated urinary retention and now has an indwelling Foley catheter. She has followup with urology in several days. As she has failed removal of this on 2 occasions they are beginning to talk about long-term Foley.  5. Loss of appetite: This been noticed for the last several months. Patient's weight has been decreasing. She does not eat a large amount. Daughter is wondering if there are any medications for appetite stimulation.  Patient's past medical, social, and family history were reviewed and updated as appropriate. History  Substance Use Topics  . Smoking status: Never Smoker   . Smokeless tobacco: Never Used  . Alcohol Use: No   Objective:  Filed Vitals:   08/23/12 0922  BP: 142/54  Pulse: 85  Temp: 99.4 F (37.4 C)   Gen: In no acute distress, tired appearing CV: Regular rate and rhythm Resp: Clear to auscultation bilaterally Back: No acute deformities Ext: Trace lower extremity edema  Assessment/Plan:  Please also see individual problems in problem list for problem-specific plans.

## 2012-09-02 ENCOUNTER — Encounter: Payer: Self-pay | Admitting: Family Medicine

## 2012-09-02 ENCOUNTER — Ambulatory Visit (INDEPENDENT_AMBULATORY_CARE_PROVIDER_SITE_OTHER): Payer: Medicaid Other | Admitting: Family Medicine

## 2012-09-02 VITALS — BP 134/49 | HR 97 | Temp 99.1°F | Ht <= 58 in | Wt 103.0 lb

## 2012-09-02 DIAGNOSIS — R339 Retention of urine, unspecified: Secondary | ICD-10-CM

## 2012-09-02 DIAGNOSIS — R634 Abnormal weight loss: Secondary | ICD-10-CM

## 2012-09-02 NOTE — Assessment & Plan Note (Signed)
Prior to the patient's daughter to get her an appointment to follow up with urology in the next 1-2 days for them to check on her Foley. If, for some reason, we're unable to see her she is instructed to call back and we'll make her an appointment to change her Foley here.

## 2012-09-02 NOTE — Assessment & Plan Note (Signed)
Megace appears to be working at least initially. We will plan to continue monitoring weight closely.

## 2012-09-02 NOTE — Progress Notes (Signed)
Patient ID: Yvette [name], female   DOB: 21-Nov-1916, 79 y.o.   MRN: [id number] Subjective: The patient is a 79 y.o. year old female who presents today for followup.  1. Appetite: Patient has been complaining of decreased appetite for some time. Megace was started last visit. Since then her appetite has picked up. Noticed a significant improvement.  2. Abdominal pain/constipation: Now taking Senokot daily. Is having at least one bowel movement per day. No longer complaining of abdominal pain.  3. Pain at Foley site: Patient's Foley catheter was replaced 3 days ago. Since then she has been complaining of pain. Is the first time she has had pain with a catheter. This has kept her from sleeping.  Patient's past medical, social, and family history were reviewed and updated as appropriate. History  Substance Use Topics  . Smoking status: Never Smoker   . Smokeless tobacco: Never Used  . Alcohol Use: No   Objective:  Filed Vitals:   09/02/12 1005  BP: 134/49  Pulse: 97  Temp: 99.1 F (37.3 C)   Gen: No acute distress CV: Regular rate and rhythm Resp: Clear to auscultation bilaterally Abdomen: Soft, nontender, mildly distended Ext: No edema  Assessment/Plan:  Please also see individual problems in problem list for problem-specific plans.

## 2012-09-24 ENCOUNTER — Inpatient Hospital Stay (HOSPITAL_COMMUNITY)
Admission: EM | Admit: 2012-09-24 | Discharge: 2012-09-27 | DRG: 281 | Disposition: A | Discharge status: Home / Self Care | Payer: Medicaid Other | Attending: Cardiology | Admitting: Cardiology

## 2012-09-24 ENCOUNTER — Encounter (HOSPITAL_COMMUNITY): Payer: Self-pay | Admitting: Physical Medicine and Rehabilitation

## 2012-09-24 ENCOUNTER — Telehealth: Payer: Self-pay | Admitting: Physician Assistant

## 2012-09-24 ENCOUNTER — Emergency Department (HOSPITAL_COMMUNITY): Payer: Medicaid Other

## 2012-09-24 DIAGNOSIS — I252 Old myocardial infarction: Secondary | ICD-10-CM | POA: Diagnosis present

## 2012-09-24 DIAGNOSIS — F411 Generalized anxiety disorder: Secondary | ICD-10-CM | POA: Diagnosis present

## 2012-09-24 DIAGNOSIS — I2510 Atherosclerotic heart disease of native coronary artery without angina pectoris: Secondary | ICD-10-CM | POA: Diagnosis present

## 2012-09-24 DIAGNOSIS — N184 Chronic kidney disease, stage 4 (severe): Secondary | ICD-10-CM | POA: Diagnosis present

## 2012-09-24 DIAGNOSIS — M549 Dorsalgia, unspecified: Secondary | ICD-10-CM | POA: Diagnosis present

## 2012-09-24 DIAGNOSIS — I5032 Chronic diastolic (congestive) heart failure: Secondary | ICD-10-CM | POA: Diagnosis present

## 2012-09-24 DIAGNOSIS — N1330 Unspecified hydronephrosis: Secondary | ICD-10-CM | POA: Diagnosis present

## 2012-09-24 DIAGNOSIS — G8929 Other chronic pain: Secondary | ICD-10-CM | POA: Diagnosis present

## 2012-09-24 DIAGNOSIS — I129 Hypertensive chronic kidney disease with stage 1 through stage 4 chronic kidney disease, or unspecified chronic kidney disease: Secondary | ICD-10-CM | POA: Diagnosis present

## 2012-09-24 DIAGNOSIS — B029 Zoster without complications: Secondary | ICD-10-CM | POA: Diagnosis present

## 2012-09-24 DIAGNOSIS — I4892 Unspecified atrial flutter: Secondary | ICD-10-CM | POA: Diagnosis present

## 2012-09-24 DIAGNOSIS — N183 Chronic kidney disease, stage 3 unspecified: Secondary | ICD-10-CM | POA: Diagnosis present

## 2012-09-24 DIAGNOSIS — I509 Heart failure, unspecified: Secondary | ICD-10-CM | POA: Diagnosis present

## 2012-09-24 DIAGNOSIS — Z79899 Other long term (current) drug therapy: Secondary | ICD-10-CM

## 2012-09-24 DIAGNOSIS — M129 Arthropathy, unspecified: Secondary | ICD-10-CM | POA: Diagnosis present

## 2012-09-24 DIAGNOSIS — Z8673 Personal history of transient ischemic attack (TIA), and cerebral infarction without residual deficits: Secondary | ICD-10-CM

## 2012-09-24 DIAGNOSIS — I495 Sick sinus syndrome: Secondary | ICD-10-CM | POA: Diagnosis present

## 2012-09-24 DIAGNOSIS — Z66 Do not resuscitate: Secondary | ICD-10-CM | POA: Diagnosis not present

## 2012-09-24 DIAGNOSIS — I2119 ST elevation (STEMI) myocardial infarction involving other coronary artery of inferior wall: Secondary | ICD-10-CM

## 2012-09-24 HISTORY — DX: Old myocardial infarction: I25.2

## 2012-09-24 LAB — CBC WITH DIFFERENTIAL/PLATELET
Basophils Absolute: 0 10*3/uL (ref 0.0–0.1)
Eosinophils Relative: 1 % (ref 0–5)
Lymphocytes Relative: 26 % (ref 12–46)
Lymphs Abs: 1.9 10*3/uL (ref 0.7–4.0)
MCV: 92.1 fL (ref 78.0–100.0)
Neutro Abs: 4.8 10*3/uL (ref 1.7–7.7)
Neutrophils Relative %: 66 % (ref 43–77)
Platelets: 287 10*3/uL (ref 150–400)
RBC: 4.43 MIL/uL (ref 3.87–5.11)
RDW: 13.1 % (ref 11.5–15.5)
WBC: 7.4 10*3/uL (ref 4.0–10.5)

## 2012-09-24 LAB — URINALYSIS, ROUTINE W REFLEX MICROSCOPIC
Bilirubin Urine: NEGATIVE
Ketones, ur: NEGATIVE mg/dL
Nitrite: NEGATIVE
Specific Gravity, Urine: 1.012 (ref 1.005–1.030)
Urobilinogen, UA: 0.2 mg/dL (ref 0.0–1.0)

## 2012-09-24 LAB — POCT I-STAT, CHEM 8
Calcium, Ion: 1.23 mmol/L (ref 1.13–1.30)
Glucose, Bld: 120 mg/dL — ABNORMAL HIGH (ref 70–99)
HCT: 44 % (ref 36.0–46.0)
Hemoglobin: 15 g/dL (ref 12.0–15.0)
Potassium: 4.2 mEq/L (ref 3.5–5.1)
TCO2: 22 mmol/L (ref 0–100)

## 2012-09-24 LAB — PROTIME-INR
INR: 0.95 (ref 0.00–1.49)
Prothrombin Time: 12.5 seconds (ref 11.6–15.2)

## 2012-09-24 LAB — COMPREHENSIVE METABOLIC PANEL
ALT: 18 U/L (ref 0–35)
AST: 25 U/L (ref 0–37)
Alkaline Phosphatase: 48 U/L (ref 39–117)
CO2: 23 mEq/L (ref 19–32)
Chloride: 106 mEq/L (ref 96–112)
GFR calc Af Amer: 41 mL/min — ABNORMAL LOW (ref >90)
GFR calc non Af Amer: 35 mL/min — ABNORMAL LOW (ref >90)
Glucose, Bld: 122 mg/dL — ABNORMAL HIGH (ref 70–99)
Potassium: 4.2 mEq/L (ref 3.5–5.1)
Sodium: 140 mEq/L (ref 135–145)

## 2012-09-24 LAB — URINE MICROSCOPIC-ADD ON

## 2012-09-24 LAB — POCT I-STAT TROPONIN I

## 2012-09-24 MED ORDER — NITROGLYCERIN IN D5W 200-5 MCG/ML-% IV SOLN
INTRAVENOUS | Status: AC
  Filled 2012-09-24: qty 250

## 2012-09-24 MED ORDER — SODIUM CHLORIDE 0.9 % IV SOLN: 250.0000 mL | INTRAVENOUS | Status: DC | PRN

## 2012-09-24 MED ORDER — ZOLPIDEM TARTRATE 5 MG PO TABS
5.0000 mg | ORAL_TABLET | Freq: Every evening | ORAL | Status: DC | PRN
  Administered 2012-09-24 – 2012-09-26 (×2): 5 mg via ORAL
  Filled 2012-09-24 (×2): qty 1

## 2012-09-24 MED ORDER — ASPIRIN 81 MG PO CHEW
324.0000 mg | CHEWABLE_TABLET | Freq: Once | ORAL | Status: AC
  Administered 2012-09-24: 324 mg via ORAL
  Filled 2012-09-24: qty 4

## 2012-09-24 MED ORDER — TRIMETHOPRIM 100 MG PO TABS
50.0000 mg | ORAL_TABLET | Freq: Every day | ORAL | Status: DC
  Administered 2012-09-24 – 2012-09-27 (×4): 50 mg via ORAL
  Filled 2012-09-24 (×5): qty 1

## 2012-09-24 MED ORDER — ACETAMINOPHEN 325 MG PO TABS
650.0000 mg | ORAL_TABLET | ORAL | Status: DC | PRN
  Administered 2012-09-25: 650 mg via ORAL
  Filled 2012-09-24: qty 2

## 2012-09-24 MED ORDER — NITROGLYCERIN 0.4 MG SL SUBL
SUBLINGUAL_TABLET | SUBLINGUAL | Status: AC
  Filled 2012-09-24: qty 25

## 2012-09-24 MED ORDER — SODIUM CHLORIDE 0.9 % IJ SOLN: 3.0000 mL | INTRAMUSCULAR | Status: DC | PRN

## 2012-09-24 MED ORDER — ONDANSETRON HCL 4 MG/2ML IJ SOLN: 4.0000 mg | Freq: Four times a day (QID) | INTRAMUSCULAR | Status: DC | PRN

## 2012-09-24 MED ORDER — SODIUM CHLORIDE 0.9 % IV SOLN
INTRAVENOUS | Status: DC
  Administered 2012-09-24: 19:00:00 via INTRAVENOUS

## 2012-09-24 MED ORDER — PANTOPRAZOLE SODIUM 40 MG PO TBEC
40.0000 mg | DELAYED_RELEASE_TABLET | Freq: Every day | ORAL | Status: DC
  Administered 2012-09-25 – 2012-09-27 (×3): 40 mg via ORAL
  Filled 2012-09-24 (×3): qty 1

## 2012-09-24 MED ORDER — OXYCODONE HCL 5 MG PO TABS
5.0000 mg | ORAL_TABLET | ORAL | Status: DC | PRN
  Administered 2012-09-24: 5 mg via ORAL
  Filled 2012-09-24: qty 1

## 2012-09-24 MED ORDER — NITROGLYCERIN IN D5W 200-5 MCG/ML-% IV SOLN
5.0000 ug/min | INTRAVENOUS | Status: DC
  Administered 2012-09-24: 5 ug/min via INTRAVENOUS

## 2012-09-24 MED ORDER — HEPARIN BOLUS VIA INFUSION: 2800.0000 [IU] | Freq: Once | INTRAVENOUS | Status: DC

## 2012-09-24 MED ORDER — CALCITONIN (SALMON) 200 UNIT/ACT NA SOLN
1.0000 | Freq: Every day | NASAL | Status: DC
  Administered 2012-09-25 – 2012-09-26 (×2): 1 via NASAL
  Filled 2012-09-24: qty 3.7

## 2012-09-24 MED ORDER — SODIUM CHLORIDE 0.9 % IJ SOLN
3.0000 mL | Freq: Two times a day (BID) | INTRAMUSCULAR | Status: DC
  Administered 2012-09-24 – 2012-09-26 (×3): 3 mL via INTRAVENOUS

## 2012-09-24 MED ORDER — PINDOLOL 5 MG PO TABS
5.0000 mg | ORAL_TABLET | Freq: Two times a day (BID) | ORAL | Status: DC
  Administered 2012-09-24 – 2012-09-27 (×6): 5 mg via ORAL
  Filled 2012-09-24 (×8): qty 1

## 2012-09-24 MED ORDER — HEPARIN SODIUM (PORCINE) 5000 UNIT/ML IJ SOLN: 60.0000 [IU]/kg | INTRAMUSCULAR | Status: DC

## 2012-09-24 MED ORDER — ALPRAZOLAM 0.25 MG PO TABS
0.2500 mg | ORAL_TABLET | Freq: Two times a day (BID) | ORAL | Status: DC | PRN
  Administered 2012-09-25: 0.25 mg via ORAL
  Filled 2012-09-24: qty 1

## 2012-09-24 MED ORDER — MEGESTROL ACETATE 40 MG PO TABS
160.0000 mg/d | ORAL_TABLET | Freq: Every day | ORAL | Status: DC
  Administered 2012-09-25 – 2012-09-27 (×3): 160 mg via ORAL
  Filled 2012-09-24 (×4): qty 4

## 2012-09-24 MED ORDER — NITROGLYCERIN 0.4 MG SL SUBL
0.4000 mg | SUBLINGUAL_TABLET | SUBLINGUAL | Status: DC | PRN
  Administered 2012-09-24: 0.4 mg via SUBLINGUAL

## 2012-09-24 MED ORDER — ONDANSETRON HCL 4 MG PO TABS: 4.0000 mg | ORAL_TABLET | Freq: Three times a day (TID) | ORAL | Status: DC | PRN

## 2012-09-24 MED ORDER — DICLOFENAC SODIUM 1 % TD GEL
2.0000 g | Freq: Four times a day (QID) | TRANSDERMAL | Status: DC
  Administered 2012-09-26 – 2012-09-27 (×4): 2 g via TOPICAL
  Filled 2012-09-24 (×2): qty 100

## 2012-09-24 MED ORDER — HEPARIN (PORCINE) IN NACL 100-0.45 UNIT/ML-% IJ SOLN
1000.0000 [IU]/h | INTRAMUSCULAR | Status: DC
  Administered 2012-09-24: 550 [IU]/h via INTRAVENOUS
  Administered 2012-09-25 – 2012-09-26 (×2): 850 [IU]/h via INTRAVENOUS
  Filled 2012-09-24 (×4): qty 250

## 2012-09-24 MED ORDER — MELATONIN 5 MG PO TABS: 10.0000 mg | ORAL_TABLET | Freq: Every day | ORAL | Status: DC

## 2012-09-24 MED ORDER — NITROGLYCERIN 0.4 MG SL SUBL: 0.4000 mg | SUBLINGUAL_TABLET | SUBLINGUAL | Status: DC | PRN

## 2012-09-24 MED ORDER — TORSEMIDE 10 MG PO TABS: 10.0000 mg | ORAL_TABLET | ORAL | Status: DC

## 2012-09-24 MED ORDER — HYDRALAZINE HCL 50 MG PO TABS
50.0000 mg | ORAL_TABLET | Freq: Four times a day (QID) | ORAL | Status: DC
  Administered 2012-09-24 – 2012-09-27 (×9): 50 mg via ORAL
  Filled 2012-09-24 (×13): qty 1

## 2012-09-24 MED ORDER — BISACODYL 10 MG RE SUPP: 10.0000 mg | Freq: Every day | RECTAL | Status: DC | PRN

## 2012-09-24 MED ORDER — SENNA 8.6 MG PO TABS
2.0000 | ORAL_TABLET | Freq: Every day | ORAL | Status: DC
  Administered 2012-09-24 – 2012-09-27 (×4): 17.2 mg via ORAL
  Filled 2012-09-24 (×4): qty 2

## 2012-09-24 NOTE — ED Notes (Signed)
Activated CODE STEMI  

## 2012-09-24 NOTE — Telephone Encounter (Signed)
Ms Tahir's daughter called because her mother is not feeling well and her heart rate is 140.   Advised her to call 911 and bring her to [facility]. The daughter said she would do so.

## 2012-09-24 NOTE — Progress Notes (Signed)
PHARMACIST - PHYSICIAN ORDER COMMUNICATION  CONCERNING: P&T Medication Policy on Herbal Medications  DESCRIPTION:  This patient's order for: Melatonin 10mg  has been noted.  This product(s) is classified as an "herbal" or natural product. Due to a lack of definitive safety studies or FDA approval, nonstandard manufacturing practices, plus the potential risk of unknown drug-drug interactions while on inpatient medications, the Pharmacy and Therapeutics Committee does not permit the use of "herbal" or natural products of this type within [place].   ACTION TAKEN: The pharmacy department is unable to verify this order at this time and your patient has been informed of this safety policy. Please reevaluate patient's clinical condition at discharge and address if the herbal or natural product(s) should be resumed at that time.  

## 2012-09-24 NOTE — H&P (Signed)
History and Physical   Patient ID: Yvette [name] MRN: [id number], DOB/AGE: [birth year] 79 y.o. Date of Encounter: 09/24/2012  Primary Physician: Marena Chancy, MD Primary Cardiologist: [facility]  Chief Complaint:  MI  HPI: Yvette [name] is a 79 y.o. female with a history of CAD. She has not been cathed due to other medical issues.  The patient lives at home and is cared for by family members. She was last hospitalized in June. Today, she developed chest pain. Her daughter was with her and gave her SL NTG x 2 plus a pindolol. Her pain was relieved, but her heart rate was reportedly > 140 and she did not feel very well, so she was brought in. In the ER, another daughter is with her. She does not speak [place] but the daughter (a [employer] employee in medical records) is translating for [place].   The patient currently denies chest pain or SOB. She is cold and has chronic pain issues in her back that radiate around to her abdomen, but no other current complaints. She is not SOB now but was earlier. She does not eat a lot but gets a small amount several times a day. She denies PND or orthopnea. She does not have LE edema. There has been no weight gain. Currently she is resting comfortably.   Past Medical History  Diagnosis Date  . Paroxysmal atrial flutter [facility] admission 2/5-04/20/2010    High fall risk-not a Coumadin candidate  . Hypertension   . Chronic diastolic heart failure     a. Echo February 2012: EF 55%; moderate LVH; mild AI; mild MR; PASP 39  . Sick sinus syndrome     a. Limits use of beta blocker-pindolol tolerated  . NSTEMI (non-ST elevated myocardial infarction)     a. Troponin 0.12, 0.13, 0.08 during admission 04/2010 - conservative mgmt.  . Arthritis   . Anxiety   . Hydronephrosis     Chronic  . CHF (congestive heart failure)   . Chronic back pain   . Stroke   . Herpes zoster 03/01/2012    Surgical History:  Past Surgical History  Procedure Laterality Date  . Esophagogastroduodenoscopy   04/19/2011    Procedure: ESOPHAGOGASTRODUODENOSCOPY (EGD);  Surgeon: Erick Blinks, MD;  Location: [facility] ENDOSCOPY;  Service: Gastroenterology;  Laterality: N/A;     I have reviewed the patient's current medications. Medication Sig  bisacodyl (DULCOLAX) 10 MG suppository Place 10 mg rectally daily as needed for constipation.  calcitonin, salmon, (MIACALCIN/FORTICAL) 200 UNIT/ACT nasal spray Place 1 spray into the nose daily.  diclofenac sodium (VOLTAREN) 1 % GEL Apply 2 g topically 4 (four) times daily.  hydrALAZINE (APRESOLINE) 50 MG tablet Take 1 tablet (50 mg total) by mouth 4 (four) times daily.  megestrol (MEGACE) 40 MG tablet Take 4 tablets (160 mg total) by mouth daily.  Melatonin 5 MG TABS Take 10 mg by mouth at bedtime.   Multiple Vitamin (MULTIVITAMIN) tablet Take 1 tablet by mouth daily.  nitroGLYCERIN (NITROSTAT) 0.4 MG SL tablet Place 0.4 mg under the tongue every 5 (five) minutes as needed for chest pain.  ondansetron (ZOFRAN) 4 MG tablet Take 1 tablet (4 mg total) by mouth every 8 (eight) hours as needed for nausea.  oxyCODONE (OXY IR/ROXICODONE) 5 MG immediate release tablet Take 1 tablet (5 mg total) by mouth every 4 (four) hours as needed for pain.  pantoprazole (PROTONIX) 40 MG tablet Take 40 mg by mouth daily.  pindolol (VISKEN) 5 MG tablet Take 2.5-5 mg by mouth  2 (two) times daily. Take 5mg  in the am and 2.5mg  in the pm  senna (SENOKOT) 8.6 MG TABS Take 2 tablets (17.2 mg total) by mouth daily.  torsemide (DEMADEX) 20 MG tablet Take 10 mg by mouth every Tuesday, Thursday, and Saturday at 6 PM.  trimethoprim (TRIMPEX) 100 MG tablet Take 0.5 tablets (50 mg total) by mouth daily.   Scheduled Meds: . aspirin  324 mg Oral Once  . nitroGLYCERIN      . nitroGLYCERIN       Continuous Infusions: . sodium chloride 20 mL/hr at 09/24/12 1853  . heparin    . heparin    . nitroGLYCERIN     PRN Meds:.nitroGLYCERIN  Allergies: No Known Allergies  History   Social History  .  Marital Status: Widowed    Spouse Name: N/A    Number of Children: N/A  . Years of Education: N/A   Occupational History  . Retired    Social History Main Topics  . Smoking status: Never Smoker   . Smokeless tobacco: Never Used  . Alcohol Use: No  . Drug Use: No  . Sexually Active: No   Other Topics Concern  . Not on file   Social History Narrative   Pt lives with family. She has 24 hour care.    Family History  Problem Relation Age of Onset  . CAD Neg Hx    Family Status  Relation Status Death Age  . Mother Deceased   . Father Deceased     Review of Systems:   Full 14-point review of systems otherwise negative except as noted above.  Physical Exam: Blood pressure 138/63, pulse 107, temperature 97.5 F (36.4 C), temperature source Oral, resp. rate 20, height 4\' 8"  (1.422 m), weight 102 lb (46.267 kg), SpO2 93.00%. General: Well developed, well nourished, elderly female in no acute distress. Head: Normocephalic, atraumatic, sclera non-icteric, no xanthomas, nares are without discharge. Dentition: poor Neck: No carotid bruits. JVD at 9 cm. No thyromegally Lungs: Good expansion bilaterally. without wheezes or rhonchi. Basilar rales Heart: Regular rate and rhythm with S1 S2.  No S3 or S4.  2/6 systolic murmur, no rubs, or gallops appreciated. Abdomen: Soft, non-tender, non-distended with normoactive bowel sounds. No hepatomegaly. No rebound/guarding. No obvious abdominal masses. Msk:  Strength and tone appear weak for age. No joint deformities or effusions, She has spinal tenderness but no costo-vertebral angle tenderness. Extremities: No clubbing or cyanosis. No edema.  Distal pedal pulses are 2+ in 4 extrem Neuro: Alert and oriented X 3. Moves all extremities spontaneously. No focal deficits noted. Psych:  Responds to questions appropriately with a normal affect. Skin: No rashes or lesions noted  Labs:   Lab Results  Component Value Date   WBC 7.2 08/17/2012   HGB  15.0 09/24/2012   HCT 44.0 09/24/2012   MCV 92.4 08/17/2012   PLT 231 08/17/2012   No results found for this basename: INR,  in the last 72 hours   Recent Labs Lab 09/24/12 1904  NA 143  K 4.2  CL 111  BUN 24*  CREATININE 1.60*  GLUCOSE 120*    Recent Labs  09/24/12 1903  TROPIPOC 0.08    Radiology/Studies: Dg Chest Portable 1 View 09/24/2012   *RADIOLOGY REPORT*  Clinical Data: Mid chest pain.  Hypertension.  Diabetes.  PORTABLE CHEST - 1 VIEW  Comparison: 08/17/2012  Findings: Moderate to prominent cardiomegaly noted. The patient is rotated to the right on today's exam, resulting in reduced  diagnostic sensitivity and specificity.   Interstitial accentuation bilaterally noted with mild airspace opacity along the right heart border.  IMPRESSION:  1.  Interstitial accentuation probably reflecting interstitial edema along with moderate to prominent cardiomegaly. 2.  Airspace opacity at the right lung base, which may reflect confluent airspace edema or pneumonia.   Original Report Authenticated By: Gaylyn Rong, M.D.   ECG: ST, rate 100, Inferior/posterior STEMI with ST elevation in III, aVL and reciprocal changes.   ASSESSMENT AND PLAN:  Principal Problem:   ST elevation myocardial infarction (STEMI) of inferior [place], initial episode of care - Discussions by Dr Garnette Scheuermann and Dr Juanito Doom held with daughter and patient. The decision was made to pursue medical therapy for the MI, no cath. She is being started on heparin and nitrates and will continue the ASA/BB and statin. Will ck an echo.  Active Problems:   Paroxysmal atrial flutter - pt has documented HR > 140 for several hours this pm, based on home BP machine readings. Arrhythmia may have contributed to the MI. Keep on telemetry, continue BB.    Chronic diastolic heart failure - possible CHF and/or PNA on CXR. EF nl on echo 2-1/2 yr ago. Not on diuretic PTA. Cr today is higher [place] last month. Denies SOB, MD advise on diuresis.     CKD (chronic kidney disease), stage III - peak Cr 1.96 5/29. 6/13 was 1.24. Continue to follow closely.     Code status - during previous admission, palliative care saw patient and she expressed a wish for full code status. However, with her general poor health and significant medical issues, may need to revisit this with a translator once she improves. Dr Daleen Squibb discussed this with her daughter, who understands.  Plan - admit CCU, continue medical therapy.  SignedTheodore Demark, PA-C 09/24/2012 7:35 PM Beeper [phone]    I have taken a history, reviewed medications, allergies, PMH, SH, FH, and reviewed ROS and examined the patient.  I agree with the assessment and plan. Perhaps we can revisit code status when more stable. Discussed with daughter.  Lalaine Overstreet C. Daleen Squibb, MD, [facility] Pager:  [phone]

## 2012-09-24 NOTE — ED Notes (Signed)
Dr. Katrinka Blazing with [facility] at the bedside speaking with the daughter.

## 2012-09-24 NOTE — ED Notes (Signed)
Pt presents to department for evaluation of midsternal non radiating chest pain. Onset 4:30pm this afternoon. Also states shortness of breath and diaphoresis. Pt moaning upon arrival. She does not speak english, family member at bedside to translate. Pt has urinary catheter, urine noted to be yellow cloudy with large sediment.

## 2012-09-24 NOTE — ED Provider Notes (Signed)
I saw and evaluated the patient, reviewed the resident's note and I agree with the findings and plan.  Patient presented with acute onset chest pain. Patient has a known history of coronary artery disease. Patient had an EKG performed in triage which showed evidence of inferior ST elevation consistent with MI. This was really brought back to trauma C. complete evaluation. STEMI was called. Doctor Katrinka Blazing has evaluated the patient in the ER and as she is now experiencing no pain, does not recommend cardiac catheterization. Patient to be admitted medically.  Agree with resident interpretation of EKG.  Gilda Crease, MD 09/24/12 2005

## 2012-09-24 NOTE — ED Provider Notes (Signed)
History    CSN: [id number] Arrival date & time 09/24/12  [id number]  First MD Initiated Contact with Patient 09/24/12 1838     Chief Complaint  Patient presents with  . Code STEMI  . Shortness of Breath   (Consider location/radiation/quality/duration/timing/severity/associated sxs/prior Treatment) Patient is a 79 y.o. female presenting with shortness of breath. The history is provided by the patient and a relative. The history is limited by a language barrier. Language interpreter used: Daughter.  Shortness of Breath Associated symptoms comment:  Yvette [name] is a 79 y.o. female h/o ACS here with acute chest pain and SOB.  Daughter states she complained of these symptoms around 4:30pm.  Daughter gave her 2 NTG which helped but did not fully relieve the pain.  Daughter denies any other symptoms in the patient.  Patient currently still feels mild chest pain and significant SOB.  Past Medical History  Diagnosis Date  . Paroxysmal atrial flutter [facility] admission 2/5-04/20/2010    High fall risk-not a Coumadin candidate  . Hypertension   . Chronic diastolic heart failure     a. Echo February 2012: EF 55%; moderate LVH; mild AI; mild MR; PASP 39  . Sick sinus syndrome     a. Limits use of beta blocker-pindolol tolerated  . NSTEMI (non-ST elevated myocardial infarction)     a. Troponin 0.12, 0.13, 0.08 during admission 04/2010 - conservative mgmt.  . Arthritis   . Anxiety   . Hydronephrosis     Chronic  . CHF (congestive heart failure)   . Chronic back pain   . Stroke   . Herpes zoster 03/01/2012   Past Surgical History  Procedure Laterality Date  . Esophagogastroduodenoscopy  04/19/2011    Procedure: ESOPHAGOGASTRODUODENOSCOPY (EGD);  Surgeon: Erick Blinks, MD;  Location: [facility] ENDOSCOPY;  Service: Gastroenterology;  Laterality: N/A;   Family History  Problem Relation Age of Onset  . CAD Neg Hx    History  Substance Use Topics  . Smoking status: Never Smoker   . Smokeless tobacco: Never Used   . Alcohol Use: No   OB History   Grav Para Term Preterm Abortions TAB SAB Ect Mult Living                 Review of Systems  Unable to perform ROS: Acuity of condition  Respiratory: Positive for shortness of breath.     Allergies  Review of patient's allergies indicates no known allergies.  Home Medications   Current Outpatient Rx  Name  Route  Sig  Dispense  Refill  . bisacodyl (DULCOLAX) 10 MG suppository   Rectal   Place 10 mg rectally daily as needed for constipation.         . calcitonin, salmon, (MIACALCIN/FORTICAL) 200 UNIT/ACT nasal spray   Nasal   Place 1 spray into the nose daily.   3.7 mL   12   . diclofenac sodium (VOLTAREN) 1 % GEL   Topical   Apply 2 g topically 4 (four) times daily.   1 Tube   3   . hydrALAZINE (APRESOLINE) 50 MG tablet   Oral   Take 1 tablet (50 mg total) by mouth 4 (four) times daily.   120 tablet   3   . megestrol (MEGACE) 40 MG tablet   Oral   Take 4 tablets (160 mg total) by mouth daily.   120 tablet   0   . Melatonin 5 MG TABS   Oral   Take 10 mg by mouth  at bedtime.          . nitroGLYCERIN (NITROSTAT) 0.4 MG SL tablet   Sublingual   Place 0.4 mg under the tongue every 5 (five) minutes as needed for chest pain.         Marland Kitchen ondansetron (ZOFRAN) 4 MG tablet   Oral   Take 1 tablet (4 mg total) by mouth every 8 (eight) hours as needed for nausea.   20 tablet   0   . oxyCODONE (OXY IR/ROXICODONE) 5 MG immediate release tablet   Oral   Take 1 tablet (5 mg total) by mouth every 4 (four) hours as needed for pain.   90 tablet   0   . pantoprazole (PROTONIX) 40 MG tablet   Oral   Take 40 mg by mouth daily.         . pindolol (VISKEN) 5 MG tablet   Oral   Take 2.5-5 mg by mouth 2 (two) times daily. Take 5mg  in the am and 2.5mg  in the pm         . senna (SENOKOT) 8.6 MG TABS   Oral   Take 2 tablets (17.2 mg total) by mouth daily.   60 each   3   . torsemide (DEMADEX) 20 MG tablet   Oral   Take 10  mg by mouth every Tuesday, Thursday, and Saturday at 6 PM.         . trimethoprim (TRIMPEX) 100 MG tablet   Oral   Take 0.5 tablets (50 mg total) by mouth daily.   30 tablet   0    BP 138/63  Pulse 107  Temp(Src) 97.5 F (36.4 C) (Oral)  Resp 20  Ht 4\' 8"  (1.422 m)  Wt 102 lb (46.267 kg)  BMI 22.88 kg/m2  SpO2 93% Physical Exam  Nursing note and vitals reviewed. Constitutional: She appears well-developed and well-nourished. She appears distressed.  HENT:  Head: Normocephalic and atraumatic.  Nose: Nose normal.  Mouth/Throat: Oropharynx is clear and moist. No oropharyngeal exudate.  Eyes: Conjunctivae and EOM are normal. Pupils are equal, round, and reactive to light. No scleral icterus.  Neck: Normal range of motion. Neck supple. No JVD present. No tracheal deviation present. No thyromegaly present.  Cardiovascular: Normal rate, regular rhythm and normal heart sounds.  Exam reveals no gallop and no friction rub.   No murmur heard. Pulmonary/Chest: Effort normal and breath sounds normal. No respiratory distress. She has no wheezes. She exhibits no tenderness.  Abdominal: Soft. Bowel sounds are normal. She exhibits no distension and no mass. There is no tenderness. There is no rebound and no guarding.  Musculoskeletal: Normal range of motion. She exhibits no edema and no tenderness.  Lymphadenopathy:    She has no cervical adenopathy.  Skin: Skin is warm and dry. No rash noted. No erythema. No pallor.    ED Course  Procedures (including critical care time) Labs Reviewed  POCT I-STAT, CHEM 8 - Abnormal; Notable for the following:    BUN 24 (*)    Creatinine, Ser 1.60 (*)    Glucose, Bld 120 (*)    All other components within normal limits  CBC WITH DIFFERENTIAL  COMPREHENSIVE METABOLIC PANEL  APTT  PROTIME-INR  URINALYSIS, ROUTINE W REFLEX MICROSCOPIC  HEPARIN LEVEL (UNFRACTIONATED)  HEPARIN LEVEL (UNFRACTIONATED)  CBC  POCT I-STAT TROPONIN I   Dg Chest Portable  1 View  09/24/2012   *RADIOLOGY REPORT*  Clinical Data: Mid chest pain.  Hypertension.  Diabetes.  PORTABLE  CHEST - 1 VIEW  Comparison: 08/17/2012  Findings: Moderate to prominent cardiomegaly noted. The patient is rotated to the right on today's exam, resulting in reduced diagnostic sensitivity and specificity.   Interstitial accentuation bilaterally noted with mild airspace opacity along the right heart border.  IMPRESSION:  1.  Interstitial accentuation probably reflecting interstitial edema along with moderate to prominent cardiomegaly. 2.  Airspace opacity at the right lung base, which may reflect confluent airspace edema or pneumonia.   Original Report Authenticated By: Gaylyn Rong, M.D.   No diagnosis found.  Date: 09/24/2012  Rate: 100  Rhythm: sinus tachycardia  QRS Axis: normal  Intervals: normal  ST/T Wave abnormalities: ST elevations inferiorly  Conduction Disutrbances:none  Narrative Interpretation:   Old EKG Reviewed: changes noted   MDM  Code STEMI called.  Patient was given an addl nitro here for persistent pain.  The last nitro cured her pain and her EKG began to normalize back to her baseline.  We had an extensive conversation with her daughter about medical management vs. Cath.  Risk and benefits were explained.  Daughter prefers to have medical management at this time.  PAtient will be admitted to [place] card for continued care.  NTG gtt ordered at the request of cardiology.    Tomasita Crumble, MD 09/24/12 [phone]

## 2012-09-24 NOTE — Progress Notes (Signed)
ANTICOAGULATION CONSULT NOTE - Initial Consult  Pharmacy Consult for Heparin Indication: chest pain/ACS  No Known Allergies  Patient Measurements: Height: 4\' 8"  (142.2 cm) Weight: 102 lb (46.267 kg) IBW/kg (Calculated) : 36.3  Vital Signs: Temp: 97.5 F (36.4 C) (07/15 1831) Temp src: Oral (07/15 1831) BP: 138/63 mmHg (07/15 1831) Pulse Rate: 107 (07/15 1831)  Labs:  Recent Labs  09/24/12 1904  HGB 15.0  HCT 44.0  CREATININE 1.60*    Estimated Creatinine Clearance: 13.7 ml/min (by C-G formula based on Cr of 1.6).   Medical History: Past Medical History  Diagnosis Date  . Paroxysmal atrial flutter [facility] admission 2/5-04/20/2010    High fall risk-not a Coumadin candidate  . Hypertension   . Chronic diastolic heart failure     a. Echo February 2012: EF 55%; moderate LVH; mild AI; mild MR; PASP 39  . Sick sinus syndrome     a. Limits use of beta blocker-pindolol tolerated  . NSTEMI (non-ST elevated myocardial infarction)     a. Troponin 0.12, 0.13, 0.08 during admission 04/2010 - conservative mgmt.  . Arthritis   . Anxiety   . Hydronephrosis     Chronic  . CHF (congestive heart failure)   . Chronic back pain   . Stroke   . Herpes zoster 03/01/2012    Assessment: 79 y/o CODE STEMI to start heparin per pharmacy. Hgb/Hct 15/44, Scr 1.60 with CrCl <30. No overt bleeding noted. APTT and PT/INR have been ordered and collected but will not delay start of heparin drip.   Goal of Therapy:  Heparin level 0.3-0.7 units/ml Monitor platelets by anticoagulation protocol: Yes   Plan:  -Heparin 2800 units BOLUS x 1 -Start heparin infusion at 550 units/hr -Check 8 hour HL -Daily CBC/HL -F/U cardiology plans -F/U labs  Thank you for allowing me to take part in this patient's care,  Abran Duke, PharmD Clinical Pharmacist Phone: [phone] Pager: [phone] 09/24/2012 7:25 PM

## 2012-09-25 LAB — LIPID PANEL: LDL Cholesterol: 87 mg/dL (ref 0–99)

## 2012-09-25 LAB — COMPREHENSIVE METABOLIC PANEL
BUN: 24 mg/dL — ABNORMAL HIGH (ref 6–23)
CO2: 21 mEq/L (ref 19–32)
Calcium: 8.9 mg/dL (ref 8.4–10.5)
Creatinine, Ser: 1.26 mg/dL — ABNORMAL HIGH (ref 0.50–1.10)
GFR calc Af Amer: 41 mL/min — ABNORMAL LOW (ref >90)
GFR calc non Af Amer: 35 mL/min — ABNORMAL LOW (ref >90)
Glucose, Bld: 103 mg/dL — ABNORMAL HIGH (ref 70–99)
Total Bilirubin: 0.3 mg/dL (ref 0.3–1.2)

## 2012-09-25 LAB — CBC
HCT: 36 % (ref 36.0–46.0)
Hemoglobin: 12 g/dL (ref 12.0–15.0)
MCH: 30.1 pg (ref 26.0–34.0)
MCHC: 33.3 g/dL (ref 30.0–36.0)
MCV: 90.2 fL (ref 78.0–100.0)

## 2012-09-25 LAB — HEMOGLOBIN A1C
Hgb A1c MFr Bld: 6.1 % — ABNORMAL HIGH (ref <5.7)
Mean Plasma Glucose: 128 mg/dL — ABNORMAL HIGH (ref <117)

## 2012-09-25 LAB — HEPARIN LEVEL (UNFRACTIONATED): Heparin Unfractionated: <0.1 IU/mL — ABNORMAL LOW (ref 0.30–0.70)

## 2012-09-25 MED ORDER — ASPIRIN EC 81 MG PO TBEC
81.0000 mg | DELAYED_RELEASE_TABLET | Freq: Every day | ORAL | Status: DC
  Administered 2012-09-25 – 2012-09-27 (×3): 81 mg via ORAL
  Filled 2012-09-25 (×3): qty 1

## 2012-09-25 MED ORDER — ASPIRIN 81 MG PO CHEW
CHEWABLE_TABLET | ORAL | Status: AC
  Filled 2012-09-25: qty 1

## 2012-09-25 MED ORDER — HEPARIN BOLUS VIA INFUSION
1000.0000 [IU] | Freq: Once | INTRAVENOUS | Status: AC
  Administered 2012-09-25: 1000 [IU] via INTRAVENOUS
  Filled 2012-09-25: qty 1000

## 2012-09-25 MED ORDER — TORSEMIDE 10 MG PO TABS
10.0000 mg | ORAL_TABLET | Freq: Every day | ORAL | Status: DC
  Administered 2012-09-25 – 2012-09-27 (×3): 10 mg via ORAL
  Filled 2012-09-25 (×3): qty 1

## 2012-09-25 NOTE — Progress Notes (Signed)
ANTICOAGULATION CONSULT NOTE -Follow up  Pharmacy Consult for Heparin Indication: chest pain/ACS  No Known Allergies  Patient Measurements: Height: 4\' 10"  (147.3 cm) Weight: 104 lb 8 oz (47.4 kg) IBW/kg (Calculated) : 40.9  Vital Signs: Temp: 98.2 F (36.8 C) (07/16 1239) Temp src: Oral (07/16 1239) BP: 135/46 mmHg (07/16 1239) Pulse Rate: 89 (07/16 1239)  Labs:  Recent Labs  09/24/12 1904 09/24/12 1929 09/24/12 2230 09/25/12 0400 09/25/12 0430 09/25/12 1215  HGB 15.0 13.3  --   --  12.0  --   HCT 44.0 40.8  --   --  36.0  --   PLT  --  287  --   --  245  --   APTT  --  32  --   --   --   --   LABPROT  --  12.5  --   --   --   --   INR  --  0.95  --   --   --   --   HEPARINUNFRC  --   --   --   --  0.16* 0.43  CREATININE 1.60* 1.26*  --   --  1.26*  --   TROPONINI  --   --  3.98* 3.14*  --   --     Estimated Creatinine Clearance: 17.6 ml/min (by C-G formula based on Cr of 1.26).  Assessment: 79 y/o female patient admitted with chest pain and paf, EKG revealed STEMI, receiving heparin for medical management. No cath planned. Heparin level now therapeutic, will continue current rate.  Goal of Therapy:  Heparin level 0.3-0.7 units/ml Monitor platelets by anticoagulation protocol: Yes   Plan:  Continue heparin gtt at 700 units/hr and recheck level in 8 hours.  Verlene Mayer, PharmD, BCPS Pager [phone]  09/25/2012 1:28 PM

## 2012-09-25 NOTE — Progress Notes (Signed)
ANTICOAGULATION CONSULT NOTE   Pharmacy Consult for Heparin Indication: chest pain/ACS  No Known Allergies  Patient Measurements: Height: 4\' 10"  (147.3 cm) Weight: 104 lb 8 oz (47.4 kg) IBW/kg (Calculated) : 40.9  Vital Signs: Temp: 97.5 F (36.4 C) (07/16 0427) Temp src: Oral (07/16 0427) BP: 152/71 mmHg (07/16 0427) Pulse Rate: 78 (07/16 0427)  Labs:  Recent Labs  09/24/12 1904 09/24/12 1929 09/24/12 2230 09/25/12 0400 09/25/12 0430  HGB 15.0 13.3  --   --  12.0  HCT 44.0 40.8  --   --  36.0  PLT  --  287  --   --  245  APTT  --  32  --   --   --   LABPROT  --  12.5  --   --   --   INR  --  0.95  --   --   --   HEPARINUNFRC  --   --   --   --  0.16*  CREATININE 1.60* 1.26*  --   --   --   TROPONINI  --   --  3.98* 3.14*  --     Estimated Creatinine Clearance: 17.6 ml/min (by C-G formula based on Cr of 1.26).  Assessment: 79 y/o female with STEMI for heparin  Goal of Therapy:  Heparin level 0.3-0.7 units/ml Monitor platelets by anticoagulation protocol: Yes   Plan:  Heparin 1000 units IV bolus, then increase heparin 700 units/hr Check heparin level in 6 hours.   Geannie Risen, PharmD, BCPS  09/25/2012 5:18 AM

## 2012-09-25 NOTE — Progress Notes (Signed)
SUBJECTIVE:  No chest pain.  No SOB. No distress.     PHYSICAL EXAM Filed Vitals:   09/25/12 0800 09/25/12 0939 09/25/12 0940 09/25/12 1100  BP: 129/97 157/52 157/52 135/53  Pulse: 79 84  80  Temp:      TempSrc:      Resp: 18   15  Height:      Weight:      SpO2:       General:  No distress Lungs:  Basilar crackles Heart:  Positive bowel sounds, no rebound no guarding Abdomen:  Positive bowel sounds, no rebound no guarding Extremities:  No edema  LABS: Lab Results  Component Value Date   TROPONINI 3.14* 09/25/2012   Results for orders placed during the hospital encounter of 09/24/12 (from the past 24 hour(s))  POCT I-STAT TROPONIN I     Status: None   Collection Time    09/24/12  7:03 PM      Result Value Range   Troponin i, poc 0.08  0.00 - 0.08 ng/mL   Comment NOTIFIED PHYSICIAN     Comment 3           POCT I-STAT, CHEM 8     Status: Abnormal   Collection Time    09/24/12  7:04 PM      Result Value Range   Sodium 143  135 - 145 mEq/L   Potassium 4.2  3.5 - 5.1 mEq/L   Chloride 111  96 - 112 mEq/L   BUN 24 (*) 6 - 23 mg/dL   Creatinine, Ser 1.61 (*) 0.50 - 1.10 mg/dL   Glucose, Bld 096 (*) 70 - 99 mg/dL   Calcium, Ion 0.45  4.09 - 1.30 mmol/L   TCO2 22  0 - 100 mmol/L   Hemoglobin 15.0  12.0 - 15.0 g/dL   HCT 81.1  91.4 - 78.2 %  URINALYSIS, ROUTINE W REFLEX MICROSCOPIC     Status: Abnormal   Collection Time    09/24/12  7:14 PM      Result Value Range   Color, Urine YELLOW  YELLOW   APPearance CLOUDY (*) CLEAR   Specific Gravity, Urine 1.012  1.005 - 1.030   pH 5.5  5.0 - 8.0   Glucose, UA NEGATIVE  NEGATIVE mg/dL   Hgb urine dipstick TRACE (*) NEGATIVE   Bilirubin Urine NEGATIVE  NEGATIVE   Ketones, ur NEGATIVE  NEGATIVE mg/dL   Protein, ur NEGATIVE  NEGATIVE mg/dL   Urobilinogen, UA 0.2  0.0 - 1.0 mg/dL   Nitrite NEGATIVE  NEGATIVE   Leukocytes, UA LARGE (*) NEGATIVE  URINE MICROSCOPIC-ADD ON     Status: Abnormal   Collection Time    09/24/12   7:14 PM      Result Value Range   Squamous Epithelial / LPF FEW (*) RARE   WBC, UA 21-50  <3 WBC/hpf   RBC / HPF 3-6  <3 RBC/hpf   Bacteria, UA FEW (*) RARE   Urine-Other MUCOUS PRESENT    CBC WITH DIFFERENTIAL     Status: None   Collection Time    09/24/12  7:29 PM      Result Value Range   WBC 7.4  4.0 - 10.5 K/uL   RBC 4.43  3.87 - 5.11 MIL/uL   Hemoglobin 13.3  12.0 - 15.0 g/dL   HCT 95.6  21.3 - 08.6 %   MCV 92.1  78.0 - 100.0 fL   MCH 30.0  26.0 - 34.0 pg  MCHC 32.6  30.0 - 36.0 g/dL   RDW 29.5  62.1 - 30.8 %   Platelets 287  150 - 400 K/uL   Neutrophils Relative % 66  43 - 77 %   Neutro Abs 4.8  1.7 - 7.7 K/uL   Lymphocytes Relative 26  12 - 46 %   Lymphs Abs 1.9  0.7 - 4.0 K/uL   Monocytes Relative 7  3 - 12 %   Monocytes Absolute 0.5  0.1 - 1.0 K/uL   Eosinophils Relative 1  0 - 5 %   Eosinophils Absolute 0.1  0.0 - 0.7 K/uL   Basophils Relative 0  0 - 1 %   Basophils Absolute 0.0  0.0 - 0.1 K/uL  COMPREHENSIVE METABOLIC PANEL     Status: Abnormal   Collection Time    09/24/12  7:29 PM      Result Value Range   Sodium 140  135 - 145 mEq/L   Potassium 4.2  3.5 - 5.1 mEq/L   Chloride 106  96 - 112 mEq/L   CO2 23  19 - 32 mEq/L   Glucose, Bld 122 (*) 70 - 99 mg/dL   BUN 23  6 - 23 mg/dL   Creatinine, Ser 6.57 (*) 0.50 - 1.10 mg/dL   Calcium 9.3  8.4 - 84.6 mg/dL   Total Protein 7.8  6.0 - 8.3 g/dL   Albumin 3.4 (*) 3.5 - 5.2 g/dL   AST 25  0 - 37 U/L   ALT 18  0 - 35 U/L   Alkaline Phosphatase 48  39 - 117 U/L   Total Bilirubin 0.3  0.3 - 1.2 mg/dL   GFR calc non Af Amer 35 (*) >90 mL/min   GFR calc Af Amer 41 (*) >90 mL/min  APTT     Status: None   Collection Time    09/24/12  7:29 PM      Result Value Range   aPTT 32  24 - 37 seconds  PROTIME-INR     Status: None   Collection Time    09/24/12  7:29 PM      Result Value Range   Prothrombin Time 12.5  11.6 - 15.2 seconds   INR 0.95  0.00 - 1.49  MRSA PCR SCREENING     Status: None   Collection Time     09/24/12  9:38 PM      Result Value Range   MRSA by PCR NEGATIVE  NEGATIVE  TROPONIN I     Status: Abnormal   Collection Time    09/24/12 10:30 PM      Result Value Range   Troponin I 3.98 (*) <0.30 ng/mL  TROPONIN I     Status: Abnormal   Collection Time    09/25/12  4:00 AM      Result Value Range   Troponin I 3.14 (*) <0.30 ng/mL  HEPARIN LEVEL (UNFRACTIONATED)     Status: Abnormal   Collection Time    09/25/12  4:30 AM      Result Value Range   Heparin Unfractionated 0.16 (*) 0.30 - 0.70 IU/mL  CBC     Status: None   Collection Time    09/25/12  4:30 AM      Result Value Range   WBC 5.3  4.0 - 10.5 K/uL   RBC 3.99  3.87 - 5.11 MIL/uL   Hemoglobin 12.0  12.0 - 15.0 g/dL   HCT 96.2  95.2 - 84.1 %  MCV 90.2  78.0 - 100.0 fL   MCH 30.1  26.0 - 34.0 pg   MCHC 33.3  30.0 - 36.0 g/dL   RDW 16.1  09.6 - 04.5 %   Platelets 245  150 - 400 K/uL  COMPREHENSIVE METABOLIC PANEL     Status: Abnormal   Collection Time    09/25/12  4:30 AM      Result Value Range   Sodium 137  135 - 145 mEq/L   Potassium 4.4  3.5 - 5.1 mEq/L   Chloride 106  96 - 112 mEq/L   CO2 21  19 - 32 mEq/L   Glucose, Bld 103 (*) 70 - 99 mg/dL   BUN 24 (*) 6 - 23 mg/dL   Creatinine, Ser 4.09 (*) 0.50 - 1.10 mg/dL   Calcium 8.9  8.4 - 81.1 mg/dL   Total Protein 6.7  6.0 - 8.3 g/dL   Albumin 3.0 (*) 3.5 - 5.2 g/dL   AST 27  0 - 37 U/L   ALT 15  0 - 35 U/L   Alkaline Phosphatase 41  39 - 117 U/L   Total Bilirubin 0.3  0.3 - 1.2 mg/dL   GFR calc non Af Amer 35 (*) >90 mL/min   GFR calc Af Amer 41 (*) >90 mL/min  LIPID PANEL     Status: None   Collection Time    09/25/12  4:30 AM      Result Value Range   Cholesterol 153  0 - 200 mg/dL   Triglycerides 60  <914 mg/dL   HDL 54  >78 mg/dL   Total CHOL/HDL Ratio 2.8     VLDL 12  0 - 40 mg/dL   LDL Cholesterol 87  0 - 99 mg/dL    Intake/Output Summary (Last 24 hours) at 09/25/12 1113 Last data filed at 09/25/12 1000  Gross per 24 hour  Intake  347.38 ml  Output    203 ml  Net 144.38 ml    EKG:  NSR, rate 81, axis WNL, intervals WNL, diffuse T wave inversion, 1 mm ST elevation.  09/25/2012  ASSESSMENT AND PLAN:   ST elevation myocardial infarction (STEMI) of inferior wall, initial episode of care:  Medical management.  I will stop the IV NTG.      Paroxysmal atrial flutter:  Now back in NSR.  She has not tolerated amiodarone in the past.  She has been very sensitive to other meds for rate control.    Chronic diastolic heart failure:  Some evidence on CXR and exam of increased fluid.  I will increase the Demadex to daily for now.    CKD (chronic kidney disease), stage III:  Stable Creat.      Fayrene Fearing [facility] 09/25/2012 11:13 AM

## 2012-09-25 NOTE — Progress Notes (Addendum)
Nurse was given EKG report from EKG tech, report states that pt experienced Acute MI/ STEMI.  PA Barrett and MD Wall paged to make aware.  MD acknowledged EKG result

## 2012-09-25 NOTE — Progress Notes (Signed)
ANTICOAGULATION CONSULT NOTE -Follow up  Pharmacy Consult for Heparin Indication: chest pain/ACS  No Known Allergies  Patient Measurements: Height: 4\' 10"  (147.3 cm) Weight: 104 lb 8 oz (47.4 kg) IBW/kg (Calculated) : 40.9  Vital Signs: Temp: 97.9 F (36.6 C) (07/16 2003) Temp src: Oral (07/16 2003) BP: 125/51 mmHg (07/16 2130) Pulse Rate: 82 (07/16 2130)  Labs:  Recent Labs  09/24/12 1904 09/24/12 1929 09/24/12 2230 09/25/12 0400 09/25/12 0430 09/25/12 1215  HGB 15.0 13.3  --   --  12.0  --   HCT 44.0 40.8  --   --  36.0  --   PLT  --  287  --   --  245  --   APTT  --  32  --   --   --   --   LABPROT  --  12.5  --   --   --   --   INR  --  0.95  --   --   --   --   HEPARINUNFRC  --   --   --   --  0.16* 0.43  CREATININE 1.60* 1.26*  --   --  1.26*  --   TROPONINI  --   --  3.98* 3.14*  --  1.00*   Heparin level at 21:43 = <0.1 (not crossing from lab system to [facility])  Estimated Creatinine Clearance: 17.6 ml/min (by C-G formula based on Cr of 1.26).  Assessment: 79 y/o female patient admitted with chest pain and paf, EKG revealed STEMI, receiving heparin for medical management. No cath planned. Heparin level now subtherapeutic.  Goal of Therapy:  Heparin level 0.3-0.7 units/ml Monitor platelets by anticoagulation protocol: Yes   Plan:  Heparin 1000 units IV bolus Increase Heparin drip to 850 units/hr Recheck heparin level in 8 hours  Estella Husk, [place].D., BCPS, AAHIVP Clinical Pharmacist Phone: [phone] or [phone] 09/25/2012, 10:43 PM

## 2012-09-25 NOTE — Progress Notes (Signed)
CRITICAL VALUE ALERT  Critical value received:  Troponin 3.98  Date of notification:  09/24/12  Time of notification:  2245  Critical value read back:yes  Nurse who received alert:  Lillia Corporal   MD notified (1st page):  Dr. Mayford Knife  Time of first page:  2300  MD notified (2nd page):  Time of second page:  Responding MD:  Dr. Mayford Knife  Time MD responded:  [phone]

## 2012-09-25 NOTE — Progress Notes (Signed)
Utilization Review Completed.  

## 2012-09-26 LAB — HEPARIN LEVEL (UNFRACTIONATED): Heparin Unfractionated: 0.56 IU/mL (ref 0.30–0.70)

## 2012-09-26 LAB — URINE CULTURE: Colony Count: 70000

## 2012-09-26 LAB — CBC
HCT: 38.9 % (ref 36.0–46.0)
Hemoglobin: 13.2 g/dL (ref 12.0–15.0)
MCHC: 33.9 g/dL (ref 30.0–36.0)
RBC: 4.3 MIL/uL (ref 3.87–5.11)

## 2012-09-26 NOTE — Progress Notes (Signed)
Patient ID: Yvette [name], female   DOB: 1916-09-28, 79 y.o.   MRN: [id number]   SUBJECTIVE:  No chest pain.  No SOB. No distress.  Son and daughter interpreting for patient Also discussed code status and they agree with DNR.  At home she is normally able to go to  [facility] herself.     PHYSICAL EXAM Filed Vitals:   09/25/12 1711 09/25/12 2003 09/25/12 2130 09/26/12 0426  BP: 145/61 108/66 125/51 148/59  Pulse:  81 82 78  Temp:  97.9 F (36.6 C)  97.1 F (36.2 C)  TempSrc:  Oral  Oral  Resp:  26  21  Height:      Weight:    101 lb 6.6 oz (46 kg)  SpO2:  100%  99%   General:  No distress Lungs:  Basilar crackles Heart:  Positive bowel sounds, no rebound no guarding Abdomen:  Positive bowel sounds, no rebound no guarding Extremities:  No edema Neuro: nonfocal  LABS: Lab Results  Component Value Date   TROPONINI 3.14* 09/25/2012   Results for orders placed during the hospital encounter of 09/24/12 (from the past 24 hour(s))  TROPONIN I     Status: Abnormal   Collection Time    09/25/12 12:15 PM      Result Value Range   Troponin I 1.00 (*) <0.30 ng/mL  HEPARIN LEVEL (UNFRACTIONATED)     Status: None   Collection Time    09/25/12 12:15 PM      Result Value Range   Heparin Unfractionated 0.43  0.30 - 0.70 IU/mL  HEPARIN LEVEL (UNFRACTIONATED)     Status: Abnormal   Collection Time    09/25/12  9:43 PM      Result Value Range   Heparin Unfractionated <0.10 (*) 0.30 - 0.70 IU/mL    Intake/Output Summary (Last 24 hours) at 09/26/12 0810 Last data filed at 09/26/12 0430  Gross per 24 hour  Intake  173.5 ml  Output   1650 ml  Net -1476.5 ml    EKG:  NSR, rate 81, axis WNL, intervals WNL, diffuse T wave inversion, 1 mm ST elevation.  09/25/12  Still with ST elevation on telemetry  No VT or arrhythmia 09/26/2012   ASSESSMENT AND PLAN:   IMI:  Continue ASA and heparin no chest pain.  Echo not ordered.  Unless she has new murmur or complication will not do    Paroxysmal atrial flutter:  Now back in NSR.  She has not tolerated amiodarone in the past.  She has been very sensitive to other meds for rate control.    Chronic diastolic heart failure:  On daily demedex  CKD (chronic kidney disease), stage III:  Stable Creat.    Code Status:  DNR    Charlton Haws 09/26/2012 8:10 AM

## 2012-09-26 NOTE — Progress Notes (Signed)
ANTICOAGULATION CONSULT NOTE -Follow up  Pharmacy Consult for Heparin Indication: chest pain/ACS  No Known Allergies  Labs:  Recent Labs  09/24/12 1904 09/24/12 1929 09/24/12 2230 09/25/12 0400  09/25/12 0430 09/25/12 1215 09/25/12 2143 09/26/12 0825  HGB 15.0 13.3  --   --   --  12.0  --   --  13.2  HCT 44.0 40.8  --   --   --  36.0  --   --  38.9  PLT  --  287  --   --   --  245  --   --  300  APTT  --  32  --   --   --   --   --   --   --   LABPROT  --  12.5  --   --   --   --   --   --   --   INR  --  0.95  --   --   --   --   --   --   --   HEPARINUNFRC  --   --   --   --   < > 0.16* 0.43 <0.10* 0.56  CREATININE 1.60* 1.26*  --   --   --  1.26*  --   --   --   TROPONINI  --   --  3.98* 3.14*  --   --  1.00*  --   --   < > = values in this interval not displayed. Heparin level at 21:43 = <0.1 (not crossing from lab system to [facility])  Estimated Creatinine Clearance: 17.6 ml/min (by C-G formula based on Cr of 1.26).  Assessment: 79 y/o female patient admitted with chest pain and paf, EKG revealed STEMI, receiving heparin for medical management. No cath planned. Heparin level now therapeutic  Goal of Therapy:  Heparin level 0.3-0.7 units/ml Monitor platelets by anticoagulation protocol: Yes   Plan:  Continue heparin at 850 units / hr Follow up AM labs  May we discontinue heparin?  Thank you. Okey Regal, PharmD [phone]  09/26/2012, 10:50 AM

## 2012-09-26 NOTE — Progress Notes (Signed)
Chaplain already in ED and responded to Code STEMI page. Pt in Trauma C with daughter at bedside. Visited with daughter. Pt dozing at this point. Per daughter doctor has decided to treat pt medically rather Adalea go to cath lab due to pt's advanced age. Daughter is ok with this. Daughter states that pt lives with her and despite fact she is 30 she is "not ready to go" (die). Per RN pt will be going to 2610.

## 2012-09-27 ENCOUNTER — Ambulatory Visit: Payer: Medicaid Other | Admitting: Family Medicine

## 2012-09-27 ENCOUNTER — Telehealth: Payer: Self-pay | Admitting: Cardiology

## 2012-09-27 LAB — CBC
HCT: 38.7 % (ref 36.0–46.0)
Hemoglobin: 13 g/dL (ref 12.0–15.0)
MCV: 91.3 fL (ref 78.0–100.0)
RBC: 4.24 MIL/uL (ref 3.87–5.11)
RDW: 13.1 % (ref 11.5–15.5)
WBC: 5.7 10*3/uL (ref 4.0–10.5)

## 2012-09-27 LAB — HEPARIN LEVEL (UNFRACTIONATED): Heparin Unfractionated: 0.17 IU/mL — ABNORMAL LOW (ref 0.30–0.70)

## 2012-09-27 MED ORDER — MEGESTROL ACETATE 40 MG PO TABS: 160.0000 mg/d | ORAL_TABLET | Freq: Every day | ORAL | Status: DC

## 2012-09-27 MED ORDER — PINDOLOL 5 MG PO TABS: 5.0000 mg | ORAL_TABLET | Freq: Two times a day (BID) | ORAL | Status: DC

## 2012-09-27 NOTE — Progress Notes (Signed)
ANTICOAGULATION CONSULT NOTE - Follow Up Consult  Pharmacy Consult for heparin Indication: chest pain/ACS  Labs:  Recent Labs  09/24/12 1904  09/24/12 1929 09/24/12 2230 09/25/12 0400  09/25/12 0430 09/25/12 1215 09/25/12 2143 09/26/12 0825 09/27/12 0500  HGB 15.0  --  13.3  --   --   --  12.0  --   --  13.2 13.0  HCT 44.0  --  40.8  --   --   --  36.0  --   --  38.9 38.7  PLT  --   < > 287  --   --   --  245  --   --  300 261  APTT  --   --  32  --   --   --   --   --   --   --   --   LABPROT  --   --  12.5  --   --   --   --   --   --   --   --   INR  --   --  0.95  --   --   --   --   --   --   --   --   HEPARINUNFRC  --   --   --   --   --   < > 0.16* 0.43 <0.10* 0.56 0.17*  CREATININE 1.60*  --  1.26*  --   --   --  1.26*  --   --   --   --   TROPONINI  --   --   --  3.98* 3.14*  --   --  1.00*  --   --   --   < > = values in this interval not displayed.   Assessment: 79yo female now subtherapeutic on heparin after one level at goal.  Goal of Therapy:  Heparin level 0.3-0.7 units/ml   Plan:  Will increase heparin gtt by 3 units/kg/hr to 1000 units/hr and check level in 8hr.  Vernard Gambles, PharmD, BCPS  09/27/2012,7:07 AM

## 2012-09-27 NOTE — Telephone Encounter (Signed)
New problem   Per Bjorn Loser PA stated pt need to see Dr Antoine Poche and no one else because he know about what is going on with her. Dr Antoine Poche didn't have any availability til September and Bjorn Loser said pt need to be seen before then and ask me to message the nurse to look at his sched and call pt for an appt.

## 2012-09-27 NOTE — Progress Notes (Signed)
   SUBJECTIVE:  No chest pain.  No SOB. No distress.  She wants to go home.    PHYSICAL EXAM Filed Vitals:   09/26/12 1800 09/26/12 1928 09/27/12 0022 09/27/12 0412  BP:  128/61 126/66 92/58  Pulse: 85 88 76 82  Temp:  98.2 F (36.8 C) 97.2 F (36.2 C) 98.8 F (37.1 C)  TempSrc:  Oral Oral Axillary  Resp: 31 21 23 21   Height:      Weight:    102 lb 8.2 oz (46.5 kg)  SpO2: 99% 97% 98% 97%   General:  No distress Lungs:  Decreased breath sounds but clear Heart:  Positive bowel sounds, no rebound no guarding Abdomen:  Positive bowel sounds, no rebound no guarding Extremities:  No edema  LABS: Lab Results  Component Value Date   TROPONINI 3.14* 09/25/2012   Results for orders placed during the hospital encounter of 09/24/12 (from the past 24 hour(s))  HEPARIN LEVEL (UNFRACTIONATED)     Status: None   Collection Time    09/26/12  8:25 AM      Result Value Range   Heparin Unfractionated 0.56  0.30 - 0.70 IU/mL  CBC     Status: None   Collection Time    09/26/12  8:25 AM      Result Value Range   WBC 7.3  4.0 - 10.5 K/uL   RBC 4.30  3.87 - 5.11 MIL/uL   Hemoglobin 13.2  12.0 - 15.0 g/dL   HCT 45.4  09.8 - 11.9 %   MCV 90.5  78.0 - 100.0 fL   MCH 30.7  26.0 - 34.0 pg   MCHC 33.9  30.0 - 36.0 g/dL   RDW 14.7  82.9 - 56.2 %   Platelets 300  150 - 400 K/uL    Intake/Output Summary (Last 24 hours) at 09/27/12 1308 Last data filed at 09/27/12 0413  Gross per 24 hour  Intake   1422 ml  Output   1200 ml  Net    222 ml    ASSESSMENT AND PLAN:   ST elevation myocardial infarction (STEMI) of inferior wall, initial episode of care:  Medical management.  Troponin trending down.   Paroxysmal atrial flutter:  Now back in NSR.  She has not tolerated amiodarone in the past.  She has been very sensitive to other meds for rate control.    Chronic diastolic heart failure:  She can go home on qod Torsemide  CKD (chronic kidney disease), stage III:  Stable Creat.      DNR  Rollene Rotunda 09/27/2012 6:53 AM

## 2012-09-27 NOTE — Telephone Encounter (Signed)
This Message is for [facility] Dr. Jenene Slicker nurse.

## 2012-09-27 NOTE — Discharge Summary (Signed)
CARDIOLOGY DISCHARGE SUMMARY   Patient ID: Yvette [name] MRN: [id number] DOB/AGE: [birth year] 79 y.o.  Admit date: 09/24/2012 Discharge date: 09/27/2012  Primary Discharge Diagnosis:     ST elevation myocardial infarction (STEMI) of inferior wall, initial episode of care  Secondary Discharge Diagnosis:    Paroxysmal atrial flutter   Chronic diastolic heart failure   CKD (chronic kidney disease), stage III  Procedures: CXR   Hospital Course: Velena Augello is a 79 y.o. female with a history of CAD. Previously ruled in for MI but never cathed due to other medical issues. She had chest pain and came to the hospital. She had received a subungual nitroglycerin at home as well as her beta blocker. Viral in the emergency room she was pain-free. Her ECG was consistent with a STEMI light because of her other medical issues, the decision was made for medical therapy. She was admitted for further evaluation and treatment.  She remained pain-free and was taken off the IV nitroglycerin after 24 hours. Heart rates greater [place] 140 reported for several hours prior to the onset of her chest pain. She was presumed to have had recurrent atrial flutter and continued on telemetry but no further arrhythmia was seen. She had not tolerated amiodarone in the past and had been very sensitive to rate control medications. She was tolerating her beta blocker and no changes were made.  She was felt to have some volume overload and her Demadex was increased. This will will be changed back to her home dose at discharge. Her renal function was followed closely and remained stable.  She was initially a full code but with her multiple medical problems and general frailty, this was addressed with this patient. Once her condition improved, with her son and daughter present, a discussion was held and she was made a DO NOT RESUSCITATE.   By 09/27/2012, her condition had improved. She had no further chest pain and her troponin was  trending down. She was maintaining sinus rhythm. She was evaluated by Dr. Antoine Poche and considered stable for discharge, to follow up as an outpatient.   Labs:  Lab Results  Component Value Date   WBC 5.7 09/27/2012   HGB 13.0 09/27/2012   HCT 38.7 09/27/2012   MCV 91.3 09/27/2012   PLT 261 09/27/2012     Recent Labs Lab 09/25/12 0430  NA 137  K 4.4  CL 106  CO2 21  BUN 24*  CREATININE 1.26*  CALCIUM 8.9  PROT 6.7  BILITOT 0.3  ALKPHOS 41  ALT 15  AST 27  GLUCOSE 103*    Recent Labs  09/24/12 2230 09/25/12 0400 09/25/12 1215  TROPONINI 3.98* 3.14* 1.00*   Lipid Panel     Component Value Date/Time   CHOL 153 09/25/2012 0430   TRIG 60 09/25/2012 0430   HDL 54 09/25/2012 0430   CHOLHDL 2.8 09/25/2012 0430   VLDL 12 09/25/2012 0430   LDLCALC 87 09/25/2012 0430    Recent Labs  09/24/12 1929  INR 0.95      Radiology: Dg Chest Portable 1 View 09/24/2012   *RADIOLOGY REPORT*  Clinical Data: Mid chest pain.  Hypertension.  Diabetes.  PORTABLE CHEST - 1 VIEW  Comparison: 08/17/2012  Findings: Moderate to prominent cardiomegaly noted. The patient is rotated to the right on today's exam, resulting in reduced diagnostic sensitivity and specificity.   Interstitial accentuation bilaterally noted with mild airspace opacity along the right heart border.  IMPRESSION:  1.  Interstitial accentuation probably reflecting  interstitial edema along with moderate to prominent cardiomegaly. 2.  Airspace opacity at the right lung base, which may reflect confluent airspace edema or pneumonia.   Original Report Authenticated By: Gaylyn Rong, M.D.   EKG:  SR, Vent. rate 81 BPM PR interval 148 ms QRS duration 76 ms QT/QTc 426/494 ms P-R-T axes 24 28 114   FOLLOW UP PLANS AND APPOINTMENTS No Known Allergies   Medication List         bisacodyl 10 MG suppository  Commonly known as:  DULCOLAX  Place 10 mg rectally daily as needed for constipation.     calcitonin (salmon) 200 UNIT/ACT  nasal spray  Commonly known as:  MIACALCIN/FORTICAL  Place 1 spray into the nose daily.     diclofenac sodium 1 % Gel  Commonly known as:  VOLTAREN  Apply 2 g topically 4 (four) times daily.     hydrALAZINE 50 MG tablet  Commonly known as:  APRESOLINE  Take 1 tablet (50 mg total) by mouth 4 (four) times daily.     megestrol 40 MG tablet  Commonly known as:  MEGACE  Take 4 tablets (160 mg total) by mouth daily.     Melatonin 5 MG Tabs  Take 10 mg by mouth at bedtime.     nitroGLYCERIN 0.4 MG SL tablet  Commonly known as:  NITROSTAT  Place 0.4 mg under the tongue every 5 (five) minutes as needed for chest pain.     ondansetron 4 MG tablet  Commonly known as:  ZOFRAN  Take 1 tablet (4 mg total) by mouth every 8 (eight) hours as needed for nausea.     oxyCODONE 5 MG immediate release tablet  Commonly known as:  Oxy IR/ROXICODONE  Take 1 tablet (5 mg total) by mouth every 4 (four) hours as needed for pain.     pantoprazole 40 MG tablet  Commonly known as:  PROTONIX  Take 40 mg by mouth daily.     pindolol 5 MG tablet  Commonly known as:  VISKEN  Take 1 tablet (5 mg total) by mouth 2 (two) times daily. Take 5mg  in the am and 2.5mg  in the pm     senna 8.6 MG Tabs  Commonly known as:  SENOKOT  Take 2 tablets (17.2 mg total) by mouth daily.     torsemide 20 MG tablet  Commonly known as:  DEMADEX  Take 10 mg by mouth every Tuesday, Thursday, and Saturday at 6 PM.     trimethoprim 100 MG tablet  Commonly known as:  TRIMPEX  Take 0.5 tablets (50 mg total) by mouth daily.         Future Appointments Provider Department Dept Phone   10/02/2012 2:45 PM Lonia Skinner, MD [facility] [phone]     Follow-up Information   Follow up with Rollene Rotunda, MD. (The office will call.)    Contact information:   [address] [place] [address] [phone]       BRING ALL MEDICATIONS WITH YOU TO FOLLOW UP  APPOINTMENTS  Time spent with patient to include physician time: 36 min Signed: Theodore Demark, PA-C 09/27/2012, 8:43 AM Co-Sign MD  Patient seen and examined.  Plan as discussed in my rounding note for yesterday and outlined above. Rollene Rotunda  09/28/2012  6:19 PM

## 2012-10-01 NOTE — Telephone Encounter (Signed)
Pt was d/ced from hospital 7/19 - when should her follow up with you be?  OK to double book?

## 2012-10-02 ENCOUNTER — Ambulatory Visit: Payer: Medicaid Other | Admitting: Family Medicine

## 2012-10-02 NOTE — Telephone Encounter (Signed)
She should follow up in two weeks but would probably need to be an APP in by absence.

## 2012-10-03 ENCOUNTER — Inpatient Hospital Stay (HOSPITAL_COMMUNITY)
Admission: EM | Admit: 2012-10-03 | Discharge: 2012-10-04 | DRG: 311 | Disposition: A | Discharge status: Home / Self Care | Payer: Medicaid Other | Attending: Cardiovascular Disease | Admitting: Cardiovascular Disease

## 2012-10-03 ENCOUNTER — Encounter (HOSPITAL_COMMUNITY)
Admission: EM | Disposition: A | Discharge status: Home / Self Care | Payer: Self-pay | Source: Home / Self Care | Attending: Cardiovascular Disease

## 2012-10-03 ENCOUNTER — Emergency Department (HOSPITAL_COMMUNITY): Payer: Medicaid Other

## 2012-10-03 ENCOUNTER — Encounter (HOSPITAL_COMMUNITY): Payer: Self-pay | Admitting: Family Medicine

## 2012-10-03 DIAGNOSIS — I5032 Chronic diastolic (congestive) heart failure: Secondary | ICD-10-CM | POA: Diagnosis present

## 2012-10-03 DIAGNOSIS — Z66 Do not resuscitate: Secondary | ICD-10-CM | POA: Diagnosis present

## 2012-10-03 DIAGNOSIS — R9431 Abnormal electrocardiogram [ECG] [EKG]: Secondary | ICD-10-CM

## 2012-10-03 DIAGNOSIS — N184 Chronic kidney disease, stage 4 (severe): Secondary | ICD-10-CM | POA: Diagnosis present

## 2012-10-03 DIAGNOSIS — F411 Generalized anxiety disorder: Secondary | ICD-10-CM | POA: Diagnosis present

## 2012-10-03 DIAGNOSIS — I4892 Unspecified atrial flutter: Secondary | ICD-10-CM | POA: Diagnosis present

## 2012-10-03 DIAGNOSIS — I10 Essential (primary) hypertension: Secondary | ICD-10-CM | POA: Diagnosis present

## 2012-10-03 DIAGNOSIS — I2119 ST elevation (STEMI) myocardial infarction involving other coronary artery of inferior wall: Secondary | ICD-10-CM

## 2012-10-03 DIAGNOSIS — I129 Hypertensive chronic kidney disease with stage 1 through stage 4 chronic kidney disease, or unspecified chronic kidney disease: Secondary | ICD-10-CM | POA: Diagnosis present

## 2012-10-03 DIAGNOSIS — G8929 Other chronic pain: Secondary | ICD-10-CM | POA: Diagnosis present

## 2012-10-03 DIAGNOSIS — I2510 Atherosclerotic heart disease of native coronary artery without angina pectoris: Secondary | ICD-10-CM | POA: Diagnosis present

## 2012-10-03 DIAGNOSIS — I200 Unstable angina: Principal | ICD-10-CM | POA: Diagnosis present

## 2012-10-03 DIAGNOSIS — Z8673 Personal history of transient ischemic attack (TIA), and cerebral infarction without residual deficits: Secondary | ICD-10-CM

## 2012-10-03 DIAGNOSIS — N1330 Unspecified hydronephrosis: Secondary | ICD-10-CM | POA: Diagnosis present

## 2012-10-03 DIAGNOSIS — Z7982 Long term (current) use of aspirin: Secondary | ICD-10-CM

## 2012-10-03 DIAGNOSIS — N183 Chronic kidney disease, stage 3 unspecified: Secondary | ICD-10-CM | POA: Diagnosis present

## 2012-10-03 DIAGNOSIS — M129 Arthropathy, unspecified: Secondary | ICD-10-CM | POA: Diagnosis present

## 2012-10-03 DIAGNOSIS — I509 Heart failure, unspecified: Secondary | ICD-10-CM | POA: Diagnosis present

## 2012-10-03 DIAGNOSIS — I495 Sick sinus syndrome: Secondary | ICD-10-CM | POA: Diagnosis present

## 2012-10-03 DIAGNOSIS — N390 Urinary tract infection, site not specified: Secondary | ICD-10-CM | POA: Diagnosis present

## 2012-10-03 HISTORY — DX: Atherosclerotic heart disease of native coronary artery without angina pectoris: I25.10

## 2012-10-03 LAB — CBC
MCV: 91.8 fL (ref 78.0–100.0)
Platelets: 220 10*3/uL (ref 150–400)
RBC: 4.26 MIL/uL (ref 3.87–5.11)
WBC: 5.7 10*3/uL (ref 4.0–10.5)

## 2012-10-03 LAB — BASIC METABOLIC PANEL
Chloride: 111 mEq/L (ref 96–112)
GFR calc Af Amer: 39 mL/min — ABNORMAL LOW (ref >90)
Potassium: 4.6 mEq/L (ref 3.5–5.1)
Sodium: 139 mEq/L (ref 135–145)

## 2012-10-03 LAB — POCT I-STAT, CHEM 8
BUN: 30 mg/dL — ABNORMAL HIGH (ref 6–23)
Chloride: 117 mEq/L — ABNORMAL HIGH (ref 96–112)
Potassium: 4.6 mEq/L (ref 3.5–5.1)
Sodium: 142 mEq/L (ref 135–145)

## 2012-10-03 LAB — POCT I-STAT TROPONIN I: Troponin i, poc: 0.04 ng/mL (ref 0.00–0.08)

## 2012-10-03 LAB — PRO B NATRIURETIC PEPTIDE: Pro B Natriuretic peptide (BNP): 4613 pg/mL — ABNORMAL HIGH (ref 0–450)

## 2012-10-03 SURGERY — LEFT HEART CATHETERIZATION WITH CORONARY ANGIOGRAM
Anesthesia: LOCAL

## 2012-10-03 MED ORDER — MELATONIN 5 MG PO TABS: 10.0000 mg | ORAL_TABLET | Freq: Every day | ORAL | Status: DC

## 2012-10-03 MED ORDER — SODIUM CHLORIDE 0.9 % IV SOLN: 250.0000 mL | INTRAVENOUS | Status: DC | PRN

## 2012-10-03 MED ORDER — SODIUM CHLORIDE 0.9 % IJ SOLN: 3.0000 mL | INTRAMUSCULAR | Status: DC | PRN

## 2012-10-03 MED ORDER — ONDANSETRON HCL 4 MG PO TABS: 4.0000 mg | ORAL_TABLET | Freq: Three times a day (TID) | ORAL | Status: DC | PRN

## 2012-10-03 MED ORDER — NITROGLYCERIN 2 % TD OINT
0.5000 [in_us] | TOPICAL_OINTMENT | Freq: Four times a day (QID) | TRANSDERMAL | Status: DC
  Administered 2012-10-03 – 2012-10-04 (×3): 0.5 [in_us] via TOPICAL
  Filled 2012-10-03: qty 30

## 2012-10-03 MED ORDER — HEPARIN SODIUM (PORCINE) 5000 UNIT/ML IJ SOLN
INTRAMUSCULAR | Status: AC
  Filled 2012-10-03: qty 1

## 2012-10-03 MED ORDER — HYDRALAZINE HCL 50 MG PO TABS
50.0000 mg | ORAL_TABLET | Freq: Four times a day (QID) | ORAL | Status: DC
  Administered 2012-10-03 – 2012-10-04 (×2): 50 mg via ORAL
  Filled 2012-10-03 (×6): qty 1

## 2012-10-03 MED ORDER — ACETAMINOPHEN 325 MG PO TABS: 650.0000 mg | ORAL_TABLET | ORAL | Status: DC | PRN

## 2012-10-03 MED ORDER — TORSEMIDE 10 MG PO TABS: 10.0000 mg | ORAL_TABLET | ORAL | Status: DC

## 2012-10-03 MED ORDER — TRAZODONE 25 MG HALF TABLET
25.0000 mg | ORAL_TABLET | Freq: Every evening | ORAL | Status: DC | PRN
  Administered 2012-10-03: 25 mg via ORAL
  Filled 2012-10-03: qty 1

## 2012-10-03 MED ORDER — ASPIRIN EC 81 MG PO TBEC
81.0000 mg | DELAYED_RELEASE_TABLET | Freq: Every day | ORAL | Status: DC
  Administered 2012-10-04: 81 mg via ORAL
  Filled 2012-10-03: qty 1

## 2012-10-03 MED ORDER — PINDOLOL 5 MG PO TABS
5.0000 mg | ORAL_TABLET | Freq: Two times a day (BID) | ORAL | Status: DC
  Administered 2012-10-03 – 2012-10-04 (×2): 5 mg via ORAL
  Filled 2012-10-03 (×3): qty 1

## 2012-10-03 MED ORDER — ONDANSETRON HCL 4 MG/2ML IJ SOLN: 4.0000 mg | Freq: Four times a day (QID) | INTRAMUSCULAR | Status: DC | PRN

## 2012-10-03 MED ORDER — SODIUM CHLORIDE 0.9 % IJ SOLN
3.0000 mL | Freq: Two times a day (BID) | INTRAMUSCULAR | Status: DC
  Administered 2012-10-03 – 2012-10-04 (×2): 3 mL via INTRAVENOUS

## 2012-10-03 MED ORDER — NITROGLYCERIN 0.4 MG SL SUBL: 0.4000 mg | SUBLINGUAL_TABLET | SUBLINGUAL | Status: DC | PRN

## 2012-10-03 MED ORDER — PANTOPRAZOLE SODIUM 40 MG PO TBEC
40.0000 mg | DELAYED_RELEASE_TABLET | Freq: Every day | ORAL | Status: DC
Start: 2012-10-04 — End: 2012-10-04
  Administered 2012-10-04: 40 mg via ORAL
  Filled 2012-10-03: qty 1

## 2012-10-03 MED ORDER — MEGESTROL ACETATE 40 MG PO TABS
160.0000 mg/d | ORAL_TABLET | Freq: Every day | ORAL | Status: DC
  Administered 2012-10-04: 160 mg via ORAL
  Filled 2012-10-03: qty 4

## 2012-10-03 MED ORDER — SENNA 8.6 MG PO TABS
2.0000 | ORAL_TABLET | Freq: Every day | ORAL | Status: DC
  Administered 2012-10-03: 17.2 mg via ORAL
  Filled 2012-10-03 (×2): qty 2

## 2012-10-03 MED ORDER — ASPIRIN EC 81 MG PO TBEC: 81.0000 mg | DELAYED_RELEASE_TABLET | Freq: Every day | ORAL | Status: DC

## 2012-10-03 MED ORDER — MORPHINE SULFATE 2 MG/ML IJ SOLN
INTRAMUSCULAR | Status: AC
Start: 2012-10-03 — End: 2012-10-04
  Filled 2012-10-03: qty 1

## 2012-10-03 MED ORDER — OXYCODONE HCL 5 MG PO TABS
5.0000 mg | ORAL_TABLET | ORAL | Status: DC | PRN
  Administered 2012-10-04: 5 mg via ORAL
  Filled 2012-10-03 (×2): qty 1

## 2012-10-03 NOTE — ED Notes (Signed)
Report attempted 

## 2012-10-03 NOTE — ED Notes (Signed)
Per family pt having chest pain that started earlier today. sts that she feels better but the pain is still slightly there. sts also some soB and earlier was hypertensive and tachycardic.

## 2012-10-03 NOTE — ED Provider Notes (Signed)
History    CSN: [id number] Arrival date & time 10/03/12  1226  First MD Initiated Contact with Patient 10/03/12 1250     Chief Complaint  Patient presents with  . Chest Pain   (Consider location/radiation/quality/duration/timing/severity/associated sxs/prior Treatment) HPI Comments: The patient is a 78 year old female with history of hypertension, CHF, and known coronary artery disease who was just recently released from the hospital from cardiology service with diagnosis of acute MI. She presents with severe, substernal chest pain that abruptly started this morning, has been constant and is similar to previous pain. Her history is somewhat limited by language barrier; her grandson is here and is interpreting for her. She reports some shortness of breath along with the chest pain that started this morning as well. She denies nausea, vomiting, or other recent illness. Nothing has made the chest pain better; nothing has made it worse.  Past Medical History  Diagnosis Date  . Paroxysmal atrial flutter [facility] admission 2/5-04/20/2010    High fall risk-not a Coumadin candidate  . Hypertension   . Chronic diastolic heart failure     a. Echo February 2012: EF 55%; moderate LVH; mild AI; mild MR; PASP 39  . Sick sinus syndrome     a. Limits use of beta blocker-pindolol tolerated  . CAD (coronary artery disease)     a. 04/2010 NSTEMI->med rx;  b. 09/2012 inf stemi->med rx.  . Arthritis   . Anxiety   . Hydronephrosis     Chronic  . CHF (congestive heart failure)   . Chronic back pain   . Stroke   . Herpes zoster 03/01/2012   Past Surgical History  Procedure Laterality Date  . Esophagogastroduodenoscopy  04/19/2011    Procedure: ESOPHAGOGASTRODUODENOSCOPY (EGD);  Surgeon: Erick Blinks, MD;  Location: [facility] ENDOSCOPY;  Service: Gastroenterology;  Laterality: N/A;   Family History  Problem Relation Age of Onset  . CAD Neg Hx    History  Substance Use Topics  . Smoking status: Never Smoker   .  Smokeless tobacco: Never Used  . Alcohol Use: No   OB History   Grav Para Term Preterm Abortions TAB SAB Ect Mult Living                 Review of Systems  Unable to perform ROS: Acuity of condition  All other systems reviewed and are negative.    Allergies  Review of patient's allergies indicates no known allergies.  Home Medications   Current Outpatient Rx  Name  Route  Sig  Dispense  Refill  . bisacodyl (DULCOLAX) 10 MG suppository   Rectal   Place 10 mg rectally daily as needed for constipation.         . calcitonin, salmon, (MIACALCIN/FORTICAL) 200 UNIT/ACT nasal spray   Nasal   Place 1 spray into the nose daily.   3.7 mL   12   . diclofenac sodium (VOLTAREN) 1 % GEL   Topical   Apply 2 g topically 4 (four) times daily.   1 Tube   3   . hydrALAZINE (APRESOLINE) 50 MG tablet   Oral   Take 1 tablet (50 mg total) by mouth 4 (four) times daily.   120 tablet   3   . megestrol (MEGACE) 40 MG tablet   Oral   Take 4 tablets (160 mg total) by mouth daily.   120 tablet   3   . Melatonin 5 MG TABS   Oral   Take 10 mg by mouth  at bedtime.          . nitroGLYCERIN (NITROSTAT) 0.4 MG SL tablet   Sublingual   Place 0.4 mg under the tongue every 5 (five) minutes as needed for chest pain.         Marland Kitchen ondansetron (ZOFRAN) 4 MG tablet   Oral   Take 1 tablet (4 mg total) by mouth every 8 (eight) hours as needed for nausea.   20 tablet   0   . oxyCODONE (OXY IR/ROXICODONE) 5 MG immediate release tablet   Oral   Take 1 tablet (5 mg total) by mouth every 4 (four) hours as needed for pain.   90 tablet   0   . pantoprazole (PROTONIX) 40 MG tablet   Oral   Take 40 mg by mouth daily.         . pindolol (VISKEN) 5 MG tablet   Oral   Take 1 tablet (5 mg total) by mouth 2 (two) times daily. Take 5mg  in the am and 2.5mg  in the pm   60 tablet   11   . senna (SENOKOT) 8.6 MG TABS   Oral   Take 2 tablets (17.2 mg total) by mouth daily.   60 each   3   .  torsemide (DEMADEX) 20 MG tablet   Oral   Take 10 mg by mouth every Tuesday, Thursday, and Saturday at 6 PM.         . trimethoprim (TRIMPEX) 100 MG tablet   Oral   Take 0.5 tablets (50 mg total) by mouth daily.   30 tablet   0    BP 109/60  Temp(Src) 97.8 F (36.6 C) (Oral)  Resp 24  SpO2 95% Physical Exam  Nursing note and vitals reviewed. Constitutional:  Appears in pain  HENT:  Head: Normocephalic and atraumatic.  Eyes: Pupils are equal, round, and reactive to light.  Neck: Normal range of motion.  Cardiovascular: Normal rate, regular rhythm, normal heart sounds and intact distal pulses.   Pulmonary/Chest: Effort normal and breath sounds normal.  Abdominal: Soft. Bowel sounds are normal.  Genitourinary:  Deferred  Musculoskeletal: Normal range of motion. She exhibits no edema and no tenderness.  Neurological: She is alert.  Skin: Skin is warm and dry.  Psychiatric: She has a normal mood and affect.    ED Course  Procedures (including critical care time) Labs Reviewed  CBC  PRO B NATRIURETIC PEPTIDE  BASIC METABOLIC PANEL   No results found. No diagnosis found.  EKG normal sinus rhythm 95; PR 152; QRS 70; QTC 490; T-wave inversions in leads 1 and aVL; ST elevations in leads 3 and aVF; T-wave inversions in lead V4, V5, and V6  Results for orders placed during the hospital encounter of 10/03/12  POCT I-STAT, CHEM 8      Result Value Range   Sodium 142  135 - 145 mEq/L   Potassium 4.6  3.5 - 5.1 mEq/L   Chloride 117 (*) 96 - 112 mEq/L   BUN 30 (*) 6 - 23 mg/dL   Creatinine, Ser 1.61 (*) 0.50 - 1.10 mg/dL   Glucose, Bld 98  70 - 99 mg/dL   Calcium, Ion 0.96  0.45 - 1.30 mmol/L   TCO2 19  0 - 100 mmol/L   Hemoglobin 14.3  12.0 - 15.0 g/dL   HCT 40.9  81.1 - 91.4 %  POCT I-STAT TROPONIN I      Result Value Range   Troponin i, poc 0.04  0.00 - 0.08 ng/mL   Comment 3            Dg Chest Portable 1 View  09/24/2012   *RADIOLOGY REPORT*  Clinical Data: Mid  chest pain.  Hypertension.  Diabetes.  PORTABLE CHEST - 1 VIEW  Comparison: 08/17/2012  Findings: Moderate to prominent cardiomegaly noted. The patient is rotated to the right on today's exam, resulting in reduced diagnostic sensitivity and specificity.   Interstitial accentuation bilaterally noted with mild airspace opacity along the right heart border.  IMPRESSION:  1.  Interstitial accentuation probably reflecting interstitial edema along with moderate to prominent cardiomegaly. 2.  Airspace opacity at the right lung base, which may reflect confluent airspace edema or pneumonia.   Original Report Authenticated By: Gaylyn Rong, M.D.      MDM  I spoke with Dr. Excell Seltzer who has evaluated the patient and spoken with family. Current plan, given patient's DO NOT RESUSCITATE status, will be comfort measures only. Morphine given for pain. Will avoid heparin drip at this point. Patient is not a candidate for catheterization.  Will avoid nitrates in the setting of possible inferior wall MI and systolic pressure of 110.  Family and patient updated regarding results and plan   Chest x-ray shows a left lateral lobe infiltrate. This was present on previous x-ray on previous admission. Clinical picture does not suggest new onset pneumonia. Will defer antibiotics at this point  Ashby Dawes, MD 10/03/12 1806

## 2012-10-03 NOTE — ED Notes (Signed)
Pts grandson reports administering 2 adult aspirin pta to the pt.  He insist that the aspirin were not the baby 81 mg PO.

## 2012-10-03 NOTE — H&P (Signed)
Patient ID: Yvette [name] MRN: [id number], DOB/AGE: 10/03/1916   Admit date: 10/03/2012  Primary Physician: Marena Chancy, MD Primary Cardiologist: J. Hochrein, MD   Pt. Profile:  79 year old Asian female who was recently admitted with inferior ST elevation MI which was managed medically, who presents back to the ED with chest pain and persistent inferior ST elevation.  Problem List  Past Medical History  Diagnosis Date  . Paroxysmal atrial flutter [facility] admission 2/5-04/20/2010    High fall risk-not a Coumadin candidate  . Hypertension   . Chronic diastolic heart failure     a. Echo February 2012: EF 55%; moderate LVH; mild AI; mild MR; PASP 39  . Sick sinus syndrome     a. Limits use of beta blocker-pindolol tolerated  . CAD (coronary artery disease) PRESUMED    a. 04/2010 NSTEMI->med rx;  b. 09/2012 inf stemi->med rx.  . Arthritis   . Anxiety   . Hydronephrosis     Chronic  . Chronic back pain   . Stroke   . Herpes zoster 03/01/2012    Past Surgical History  Procedure Laterality Date  . Esophagogastroduodenoscopy  04/19/2011    Procedure: ESOPHAGOGASTRODUODENOSCOPY (EGD);  Surgeon: Erick Blinks, MD;  Location: [facility] ENDOSCOPY;  Service: Gastroenterology;  Laterality: N/A;     Allergies  No Known Allergies  HPI  79 year old female with the above problem list. She was recently admitted on July 14 with rapid atrial flutter and complaints of chest pain and finding of inferior ST segment elevation with lateral ST and T changes on ECG. After discussion with family, decision was made to pursue conservative therapy. She did rule in, eventually peaking her troponin at 3.98. She was maintained on beta blocker therapy with resolution of atrial flutter. She was not treated with aspirin or statin. After further discussion with family, decision was made to make her a DO NOT RESUSCITATE. She was discharged on July 18 when per her family, she was doing reasonably well without recurrence of chest  pain or dyspnea. She lives with family and ambulation is quite limited. She does require assistance to get around. She also uses a bedside commode. This morning at approximately 1 AM, she complained of dyspnea and chest discomfort. Her grandson give her aspirin and then called our office. He was advised to take her to the ED. Here, she was found to have persistent inferior ST segment elevation with less pronounced lateral T changes. Initially a code STEMI was called however this was subsequently called off after discussing treatment options with the family, as they wish to continue with conservative management. She's been treated with morphine in the ED is currently feeling somewhat better. She is hemodynamically stable. All lab work is currently pending.  Home Medications  Prior to Admission medications   Medication Sig Start Date End Date Taking? Authorizing Provider  bisacodyl (DULCOLAX) 10 MG suppository Place 10 mg rectally daily as needed for constipation.    Historical Provider, MD  calcitonin, salmon, (MIACALCIN/FORTICAL) 200 UNIT/ACT nasal spray Place 1 spray into the nose daily. 07/29/12   Renee A Kuneff, DO  diclofenac sodium (VOLTAREN) 1 % GEL Apply 2 g topically 4 (four) times daily. 08/12/12   Renee A Kuneff, DO  hydrALAZINE (APRESOLINE) 50 MG tablet Take 1 tablet (50 mg total) by mouth 4 (four) times daily. 07/25/12   Tommie Sams, DO  megestrol (MEGACE) 40 MG tablet Take 4 tablets (160 mg total) by mouth daily. 09/27/12   Darrol Jump, PA-C  Melatonin 5 MG TABS Take 10 mg by mouth at bedtime.     Historical Provider, MD  nitroGLYCERIN (NITROSTAT) 0.4 MG SL tablet Place 0.4 mg under the tongue every 5 (five) minutes as needed for chest pain.    Historical Provider, MD  ondansetron (ZOFRAN) 4 MG tablet Take 1 tablet (4 mg total) by mouth every 8 (eight) hours as needed for nausea. 08/17/12   Phebe Colla, MD  oxyCODONE (OXY IR/ROXICODONE) 5 MG immediate release tablet Take 1 tablet (5 mg  total) by mouth every 4 (four) hours as needed for pain. 08/23/12   Brent Bulla, MD  pantoprazole (PROTONIX) 40 MG tablet Take 40 mg by mouth daily.    Historical Provider, MD  pindolol (VISKEN) 5 MG tablet Take 1 tablet (5 mg total) by mouth 2 (two) times daily. Take 5mg  in the am and 2.5mg  in the pm 09/27/12   Joline Salt Barrett, PA-C  senna (SENOKOT) 8.6 MG TABS Take 2 tablets (17.2 mg total) by mouth daily. 08/23/12   Brent Bulla, MD  torsemide (DEMADEX) 20 MG tablet Take 10 mg by mouth every Tuesday, Thursday, and Saturday at 6 PM.    Historical Provider, MD  trimethoprim (TRIMPEX) 100 MG tablet Take 0.5 tablets (50 mg total) by mouth daily. 07/25/12   Tommie Sams, DO   Family History  Family History  Problem Relation Age of Onset  . CAD Neg Hx    Social History  History   Social History  . Marital Status: Widowed    Spouse Name: N/A    Number of Children: N/A  . Years of Education: N/A   Occupational History  . Retired    Social History Main Topics  . Smoking status: Never Smoker   . Smokeless tobacco: Never Used  . Alcohol Use: No  . Drug Use: No  . Sexually Active: No   Other Topics Concern  . Not on file   Social History Narrative   Pt lives with family. She has 24 hour care.   Review of Systems General:  No chills, fever, night sweats or weight changes.  Family reports that she often complains of pain all over.  She requires assistance with ambulation and uses a bedside commode @ home.  Cardiovascular:  +++ chest pain, and dyspnea.  She has some degree of chronic doe.  No edema, orthopnea, palpitations, paroxysmal nocturnal dyspnea. Dermatological: No rash, lesions/masses Respiratory: No cough, +++ dyspnea Urologic: No hematuria, dysuria Abdominal:   No nausea, vomiting, diarrhea, bright red blood per rectum, melena, or hematemesis Neurologic:  No visual changes, wkns, changes in mental status. All other systems reviewed and are otherwise negative except as noted  above.  Physical Exam  Blood pressure 109/60, temperature 97.8 F (36.6 C), temperature source Oral, resp. rate 24, SpO2 95.00%.  General: Pleasant, NAD Psych: Normal affect. Neuro: Alert and oriented X 3. Moves all extremities spontaneously. HEENT: Normal  Neck: Supple without bruits or JVD. Lungs:  Resp regular and unlabored, CTA. Heart: RRR no s3, s4, 2/6 systolic murmur. Abdomen: Soft, non-tender, non-distended, BS + x 4.  Extremities: No clubbing, cyanosis or edema. DP/PT/Radials 2+ and equal bilaterally.  Labs  **All labs are pending today  Lab Results  Component Value Date   WBC 5.7 09/27/2012   HGB 14.3 10/03/2012   HCT 42.0 10/03/2012   MCV 91.3 09/27/2012   PLT 261 09/27/2012   Lab Results  Component Value Date   CHOL 153 09/25/2012  HDL 54 09-30-2012   LDLCALC 87 09-30-2012   TRIG 60 09/30/12   Radiology/Studies  Dg Chest Portable 1 View  09/24/2012   *RADIOLOGY REPORT*  Clinical Data: Mid chest pain.  Hypertension.  Diabetes.  PORTABLE CHEST - 1 VIEW  Comparison: 08/17/2012  Findings: Moderate to prominent cardiomegaly noted. The patient is rotated to the right on today's exam, resulting in reduced diagnostic sensitivity and specificity.   Interstitial accentuation bilaterally noted with mild airspace opacity along the right heart border.  IMPRESSION:  1.  Interstitial accentuation probably reflecting interstitial edema along with moderate to prominent cardiomegaly. 2.  Airspace opacity at the right lung base, which may reflect confluent airspace edema or pneumonia.   Original Report Authenticated By: Gaylyn Rong, M.D.    ECG  Rsr, 95, .5-4mm ST elevation in II, III, aVF, V4-V6, lateral TWI. - not significantly changed from last ECG on 01-Oct-2022 with less pronounced ST/T changes in lateral leads.  ASSESSMENT AND PLAN  1. Unstable angina with ST segment elevation: Patient presents with acute onset of chest pain and dyspnea that started about 11 AM. She does have  inferior ST elevation however this was also present on her last ECG on 07/21/24and thus it is not clear if this is an acute change or not. Pain is improved with intravenous morphine. Code STEMI was initially called but has been called off after discussion with family. Family would like to continue to pursue conservative measures and as result, will bring the patient in the hospital and continue her home medications. Add aspirin. We will not be using IV anticoagulation. A troponin is pending however we do not plan to assess further troponins. Given her advanced age, frailty, comorbidities, and recurrence of symptoms, we will ask palliative care to assess the patient and ultimately she may benefit from hospice upon discharge.  2. Hypertension: Currently stable. Follow.  3. Chronic diastolic congestive heart failure:  Volume currently appears stable. We will be checking strict eyes nose and daily weights.  4. Stage III chronic in disease: Follow.  5. Paroxysmal atrial flutter: Continue beta blocker therapy. She does have a history of sick sinus syndrome and has also not tolerated amiodarone in the past. She is currently in sinus rhythm.  Signed, Nicolasa Ducking, NP 10/03/2012, 1:37 PM  Patient seen, examined. Available data reviewed. Agree with findings, assessment, and plan as outlined by Ward Givens, NP. Exam reveals an elderly, frail woman in moderate distress secondary to pain. Lungs are clear, heart is RRR, and there is no peripheral edema. Abdomen is soft with mild diffuse tenderness and positive bowel sounds. EKG shows inferior ST elevation unchanged from old tracings. Extensive records reviewed. Difficult to know if acute event here since baseline EKG shows ST elevation consistent with an old MI and probably inferior wall aneurysm. The patient was made DNR at the time of her last admission less [place] 2 weeks ago. We will treat her conservatively with aspirin and analgesics for pain. Palliative  care will be consulted to help with comfort measures. The patient is absolutely not an appropriate candidate for invasive cardiac procedures and the family confirms wishes for DNR/comfort. She will not be treated with a statin, beta-blocker, or heparin because of comfort measures.  Tonny Bollman, M.D. 10/03/2012 1:48 PM

## 2012-10-03 NOTE — ED Notes (Signed)
Pt noted to have ST segment elevation in her Inferior leads with reciprical changes in I and AvL.

## 2012-10-03 NOTE — Progress Notes (Addendum)
Chaplain Note:  Chaplain responded immediately to Code Stemi, paged out a at 12:56.  Code was cancelled soon after chaplain's arrival.   Pt was on stretcher in trauma bay being treated by ED and Cath lab staff.  Pt's son and grandson were at bedside in support of pt.  Chaplain provided spiritual comfort and support for pt and pt's family. Family expressed appreciation for chaplain support.  Chaplain will follow up as needed.  10/03/12 1256  Clinical Encounter Type  Visited With Patient and family together  Visit Type Spiritual support;ED (Code Stemi)  Referral From Physician  Spiritual Encounters  Spiritual Needs Emotional  Stress Factors  Patient Stress Factors Health changes;Major life changes  Family Stress Factors Loss of control;Major life changes  Verdie Shire, [address]  [phone]

## 2012-10-04 DIAGNOSIS — N390 Urinary tract infection, site not specified: Secondary | ICD-10-CM

## 2012-10-04 DIAGNOSIS — I200 Unstable angina: Secondary | ICD-10-CM

## 2012-10-04 DIAGNOSIS — R9431 Abnormal electrocardiogram [ECG] [EKG]: Secondary | ICD-10-CM

## 2012-10-04 HISTORY — DX: Unstable angina: I20.0

## 2012-10-04 HISTORY — DX: Urinary tract infection, site not specified: N39.0

## 2012-10-04 LAB — URINALYSIS, ROUTINE W REFLEX MICROSCOPIC
Bilirubin Urine: NEGATIVE
Glucose, UA: NEGATIVE mg/dL
Hgb urine dipstick: NEGATIVE
Specific Gravity, Urine: 1.013 (ref 1.005–1.030)
Urobilinogen, UA: 0.2 mg/dL (ref 0.0–1.0)

## 2012-10-04 LAB — URINE MICROSCOPIC-ADD ON

## 2012-10-04 MED ORDER — ISOSORBIDE MONONITRATE ER 30 MG PO TB24: 15.0000 mg | ORAL_TABLET | Freq: Every day | ORAL | Status: DC

## 2012-10-04 MED ORDER — CIPROFLOXACIN HCL 250 MG PO TABS: 250.0000 mg | ORAL_TABLET | Freq: Every day | ORAL | Status: DC

## 2012-10-04 MED ORDER — NITROGLYCERIN 0.4 MG SL SUBL: 0.4000 mg | SUBLINGUAL_TABLET | SUBLINGUAL | Status: AC | PRN

## 2012-10-04 MED ORDER — ISOSORBIDE MONONITRATE 15 MG HALF TABLET
15.0000 mg | ORAL_TABLET | Freq: Every day | ORAL | Status: DC
  Administered 2012-10-04: 15 mg via ORAL
  Filled 2012-10-04: qty 1

## 2012-10-04 NOTE — Care Management Note (Addendum)
    Page 1 of 2   10/04/2012     9:34:21 AM   CARE MANAGEMENT NOTE 10/04/2012  Patient:  Yvette [name], Yvette [name]   Account Number:  [id number]  Date Initiated:  10/04/2012  Documentation initiated by:  Junius Creamer  Subjective/Objective Assessment:   adm w mi     Action/Plan:   lives w fam   Anticipated DC Date:     Anticipated DC Plan:  HOME W HOME HEALTH SERVICES  In-house referral  Hospice / Palliative Care      DC Planning Services  CM consult      [facility] Choice  HOME HEALTH   Choice offered to / List presented to:  C-4 Adult Children        HH arranged  HH-1 RN  HH-10 DISEASE MANAGEMENT  HH-2 PT  HH-6 SOCIAL WORKER      HH agency  [facility]   Status of service:   Medicare Important Message given?   (If response is "NO", the following Medicare IM given date fields will be blank) Date Medicare IM given:   Date Additional Medicare IM given:    Discharge Disposition:  HOME W HOME HEALTH SERVICES  Per UR Regulation:  Reviewed for med. necessity/level of care/duration of stay  If discussed at Long Length of Stay Meetings, dates discussed:    Comments:  7/25 0931 debbie Korianna Washer rn,bsn spoke w hospice margie and fam wants to consider hospice but not ready to make that decision yet. did leave hospice home care list w niece in room. spoke w [employer] by phone and they will look over hospice inform and talk among family and they will cont w [facility] until they decide. have spoken w donna w [facility] and alerted her that pt may be disch home today. enc nse and sw to work w fam on meds that may control ch pain instead of er visits.

## 2012-10-04 NOTE — Discharge Summary (Signed)
Patient ID: Yvette [name],  MRN: [id number], DOB/AGE: Dec 07, 1916 79 y.o.  Admit date: 10/03/2012 Discharge date: 10/04/2012  Primary Care Provider: Marena Chancy Primary Cardiologist: J. Nicholson Starace, MD   Discharge Diagnoses Principal Problem:   Unstable angina  **Conservatively managed. Active Problems:   Paroxysmal atrial flutter   Chronic diastolic heart failure   Sick sinus syndrome   CKD (chronic kidney disease), stage III   HTN (hypertension)   Abnormal ECG  **persistent inferior ST elevation with lateral ST/T changes.   UTI (urinary tract infection)  Allergies No Known Allergies  Procedures  None  History of Present Illness  79 year old female with the above problem list. She was recently admitted on July 14 with rapid atrial flutter and complaints of chest pain and finding of inferior ST segment elevation with lateral ST and T changes on ECG. After discussion with family, decision was made to pursue conservative therapy. She did rule in, eventually peaking her troponin at 3.98. She was maintained on beta blocker therapy with resolution of atrial flutter. She was not treated with aspirin or statin. After further discussion with family, decision was made to make her a DO NOT RESUSCITATE. She was discharged on July 18 when per her family, she was doing reasonably well without recurrence of chest pain or dyspnea. She lives with family and ambulation is quite limited. She does require assistance to get around. She also uses a bedside commode.  On the morning of admission, at 11 AM, the patient complained of dyspnea and chest pain.  She was treated with ASA @ home and then taken to the [facility] ED.  There, she was noted to have inferior ST elevation with lateral ST depression and twi.  This was not significantly changed from her most recent ECG on 7/16.  After additional discussion with family, it was mutually agreed that she would benefit most from continued conservative therapy.  She was  admitted for further management.  Hospital Course  Because the goal was comfort measures, we did not perform extensive laboratory testing or initiate anticoagulation.  Pt received morphine in the ED and had no further chest pain.  One troponin was evaluated and was normal.  We asked palliative care to engage the family and after some discussion, the family is not currently interested in palliative care.  Patient has been hemodynamically stable and we have added low dose long-acting nitrate therapy to her regimen.  She will be discharged home today.  Of note, pt has chronic foley placement.  UA was abnormal and she has been initiated on renal dosed cipro therapy.  Discharge Vitals Blood pressure 129/52, pulse 80, temperature 98.8 F (37.1 C), temperature source Oral, resp. rate 21, height 4\' 10"  (1.473 m), weight 100 lb 8.5 oz (45.6 kg), SpO2 96.00%.  Filed Weights   10/03/12 1751 10/04/12 0500  Weight: 102 lb 8.2 oz (46.5 kg) 100 lb 8.5 oz (45.6 kg)   Labs  Trop i, poc:  0.04 CBC  Recent Labs  10/03/12 1321 10/03/12 1434  WBC  --  5.7  HGB 14.3 13.2  HCT 42.0 39.1  MCV  --  91.8  PLT  --  220   Basic Metabolic Panel  Recent Labs  10/03/12 1321 10/03/12 1434  NA 142 139  K 4.6 4.6  CL 117* 111  CO2  --  18*  GLUCOSE 98 98  BUN 30* 28*  CREATININE 1.70* 1.30*  CALCIUM  --  8.8   Disposition  Pt is being discharged home  today in fair condition.  Follow-up Plans & Appointments  Follow-up Information   Follow up with Tereso Newcomer, PA-C On 10/29/2012. (11:50 AM - Dr. Jenene Slicker PA)    Contact information:   [address] [place] [address] [phone]      Discharge Medications    Medication List    STOP taking these medications       trimethoprim 100 MG tablet  Commonly known as:  TRIMPEX      TAKE these medications       aspirin EC 81 MG tablet  Take 81 mg by mouth daily.     bisacodyl 10 MG suppository  Commonly known as:   DULCOLAX  Place 10 mg rectally daily as needed for constipation.     ciprofloxacin 250 MG tablet  Commonly known as:  CIPRO  Take 1 tablet (250 mg total) by mouth daily.     diclofenac sodium 1 % Gel  Commonly known as:  VOLTAREN  Apply 2 g topically 4 (four) times daily.     hydrALAZINE 50 MG tablet  Commonly known as:  APRESOLINE  Take 1 tablet (50 mg total) by mouth 4 (four) times daily.     isosorbide mononitrate 30 MG 24 hr tablet  Commonly known as:  IMDUR  Take 0.5 tablets (15 mg total) by mouth daily.     megestrol 40 MG tablet  Commonly known as:  MEGACE  Take 4 tablets (160 mg total) by mouth daily.     Melatonin 5 MG Tabs  Take 10 mg by mouth at bedtime.     nitroGLYCERIN 0.4 MG SL tablet  Commonly known as:  NITROSTAT  Place 1 tablet (0.4 mg total) under the tongue every 5 (five) minutes as needed for chest pain.     ondansetron 4 MG tablet  Commonly known as:  ZOFRAN  Take 1 tablet (4 mg total) by mouth every 8 (eight) hours as needed for nausea.     oxyCODONE 5 MG immediate release tablet  Commonly known as:  Oxy IR/ROXICODONE  Take 1 tablet (5 mg total) by mouth every 4 (four) hours as needed for pain.     pantoprazole 40 MG tablet  Commonly known as:  PROTONIX  Take 40 mg by mouth daily.     pindolol 5 MG tablet  Commonly known as:  VISKEN  Take 1 tablet (5 mg total) by mouth 2 (two) times daily. Take 5mg  in the am and 2.5mg  in the pm     senna 8.6 MG Tabs  Commonly known as:  SENOKOT  Take 2 tablets (17.2 mg total) by mouth daily.     torsemide 20 MG tablet  Commonly known as:  DEMADEX  Take 10 mg by mouth every Tuesday, Thursday, and Saturday at 6 PM.       Outstanding Labs/Studies  none  Duration of Discharge Encounter   [place] 30 minutes including physician time.  Signed, Nicolasa Ducking NP 10/04/2012, 12:12 PM   Patient seen and examined.  Plan as discussed in my rounding note for today and outlined above. Rollene Rotunda   10/04/2012  1:02 PM

## 2012-10-04 NOTE — Progress Notes (Signed)
Palliative Medicine Team consult for GOC, hospice consult received; patient previously seen by PMT 07/24/12;  Writer spoke with daughter Sylina Henion via phone c: [phone] - she is working today and stated she does not wish to meet- discussed possibility of hospice services at home and being able to set up for an evaluation over the weekend should the patient discharge (per Dr Jenene Slicker note today)  - daughter stated she did not feel she could make that decision without speaking to her brother and other family members; [place] stated her family realizes her mother is very sick and physically declining, however she reports her 'mother's mind is intact and she can say what she wants' -[place] voiced she wants to be able to speak with her mother and brother specifically about hospice -Clinical research associate attempted to inform daughter about hospice team approach to services and the benefits however [place] stated she did not think her mother or her brother would agree at this time Scientific laboratory technician tried to offer a time to allow this discussion to occur either before patient left the hospital or on the weekend with the hospice agency of her choice scheduled to see them at home - her daughter again stated she would not want to arrange this ahead of time she would want to speak with the [facility] about a home health nurse and would prefer to receive information about hospice agencies and she could contact them from home. PMT contact information as well as hospice information left with granddaughter in room per daughter Hong's request   PMT will sign off at this time please re-consult if we can be of help   Valente David, RN 10/04/2012, 9:53 AM Palliative Medicine Team RN Liaison [phone]

## 2012-10-04 NOTE — Progress Notes (Signed)
SUBJECTIVE:  No chest pain.  No SOB.  She wants to go home.    PHYSICAL EXAM Filed Vitals:   10/03/12 2200 10/04/12 0000 10/04/12 0411 10/04/12 0500  BP: 129/43 151/54 156/70   Pulse: 68 78 83   Temp:  98.6 F (37 C) 99 F (37.2 C)   TempSrc:  Oral Oral   Resp: 18 24 17    Height:      Weight:    100 lb 8.5 oz (45.6 kg)  SpO2: 100% 100% 98%    General:  No distress Lungs:  Decreased breath sounds, clear Heart:  Positive bowel sounds, no rebound no guarding Abdomen:  Positive bowel sounds, no rebound no guarding Extremities:  No edema  LABS:  Results for orders placed during the hospital encounter of 10/03/12 (from the past 24 hour(s))  POCT I-STAT TROPONIN I     Status: None   Collection Time    10/03/12  1:14 PM      Result Value Range   Troponin i, poc 0.04  0.00 - 0.08 ng/mL   Comment 3           POCT I-STAT, CHEM 8     Status: Abnormal   Collection Time    10/03/12  1:21 PM      Result Value Range   Sodium 142  135 - 145 mEq/L   Potassium 4.6  3.5 - 5.1 mEq/L   Chloride 117 (*) 96 - 112 mEq/L   BUN 30 (*) 6 - 23 mg/dL   Creatinine, Ser 1.61 (*) 0.50 - 1.10 mg/dL   Glucose, Bld 98  70 - 99 mg/dL   Calcium, Ion 0.96  0.45 - 1.30 mmol/L   TCO2 19  0 - 100 mmol/L   Hemoglobin 14.3  12.0 - 15.0 g/dL   HCT 40.9  81.1 - 91.4 %  CBC     Status: None   Collection Time    10/03/12  2:34 PM      Result Value Range   WBC 5.7  4.0 - 10.5 K/uL   RBC 4.26  3.87 - 5.11 MIL/uL   Hemoglobin 13.2  12.0 - 15.0 g/dL   HCT 78.2  95.6 - 21.3 %   MCV 91.8  78.0 - 100.0 fL   MCH 31.0  26.0 - 34.0 pg   MCHC 33.8  30.0 - 36.0 g/dL   RDW 08.6  57.8 - 46.9 %   Platelets 220  150 - 400 K/uL  PRO B NATRIURETIC PEPTIDE     Status: Abnormal   Collection Time    10/03/12  2:34 PM      Result Value Range   Pro B Natriuretic peptide (BNP) 4613.0 (*) 0 - 450 pg/mL  BASIC METABOLIC PANEL     Status: Abnormal   Collection Time    10/03/12  2:34 PM      Result Value Range   Sodium  139  135 - 145 mEq/L   Potassium 4.6  3.5 - 5.1 mEq/L   Chloride 111  96 - 112 mEq/L   CO2 18 (*) 19 - 32 mEq/L   Glucose, Bld 98  70 - 99 mg/dL   BUN 28 (*) 6 - 23 mg/dL   Creatinine, Ser 6.29 (*) 0.50 - 1.10 mg/dL   Calcium 8.8  8.4 - 52.8 mg/dL   GFR calc non Af Amer 34 (*) >90 mL/min   GFR calc Af Amer 39 (*) >90 mL/min  URINALYSIS, ROUTINE W  REFLEX MICROSCOPIC     Status: Abnormal   Collection Time    10/04/12 12:03 AM      Result Value Range   Color, Urine YELLOW  YELLOW   APPearance CLOUDY (*) CLEAR   Specific Gravity, Urine 1.013  1.005 - 1.030   pH 6.0  5.0 - 8.0   Glucose, UA NEGATIVE  NEGATIVE mg/dL   Hgb urine dipstick NEGATIVE  NEGATIVE   Bilirubin Urine NEGATIVE  NEGATIVE   Ketones, ur NEGATIVE  NEGATIVE mg/dL   Protein, ur NEGATIVE  NEGATIVE mg/dL   Urobilinogen, UA 0.2  0.0 - 1.0 mg/dL   Nitrite POSITIVE (*) NEGATIVE   Leukocytes, UA LARGE (*) NEGATIVE  URINE MICROSCOPIC-ADD ON     Status: Abnormal   Collection Time    10/04/12 12:03 AM      Result Value Range   WBC, UA 11-20  <3 WBC/hpf   RBC / HPF 0-2  <3 RBC/hpf   Bacteria, UA MANY (*) RARE   Casts HYALINE CASTS (*) NEGATIVE   Urine-Other AMORPHOUS URATES/PHOSPHATES      Intake/Output Summary (Last 24 hours) at 10/04/12 0815 Last data filed at 10/04/12 0600  Gross per 24 hour  Intake    300 ml  Output   1115 ml  Net   -815 ml    ASSESSMENT AND PLAN:   CHEST PAIN:  Troponin this admission is negative.  No further work up.  I will stop NTG paste and start Imdur.    Paroxysmal atrial flutter:  None this admission.  Intolerant of amiodarone  Chronic diastolic heart failure:  Prop BNP was up slightly.  I will give a dose of Torsemide today.    CKD (chronic kidney disease), stage III:  Stable Creat.     DNR:  I will ask hospice to consult for home care.  The family would like to take her home today.   Yvette [name] [facility] 10/04/2012 8:15 AM

## 2012-10-04 NOTE — Progress Notes (Signed)
[facility]  Patient Status: Active (receiving services up to time of hospitalization)  [facility] is providing the following services: RN, PT and MSW  If patient discharges after hours, please call [phone].   Yvette [name] 10/04/2012, 10:17 AM

## 2012-10-05 LAB — URINE CULTURE

## 2012-10-09 ENCOUNTER — Encounter: Payer: Self-pay | Admitting: Physician Assistant

## 2012-10-10 NOTE — Telephone Encounter (Signed)
Pt was given an appointment with Tereso Newcomer, PA

## 2012-10-25 ENCOUNTER — Encounter: Payer: Medicaid Other | Admitting: Physician Assistant

## 2012-10-29 ENCOUNTER — Encounter: Payer: Medicaid Other | Admitting: Physician Assistant

## 2012-11-19 ENCOUNTER — Telehealth: Payer: Self-pay | Admitting: Cardiology

## 2012-11-19 NOTE — Telephone Encounter (Signed)
New Problem  Pt DTR states bp is high 183-198 for 3 days discomfort in her chest. Does not want to go to ER//

## 2012-11-19 NOTE — Telephone Encounter (Signed)
Left pt a message to call back. 

## 2012-11-19 NOTE — Telephone Encounter (Signed)
Patient's daughter returned call she stated mother has been having chest heaviness and sob.Stated yesterday chest heaviness and sob off and on all day took 3 NTGs with relief.Stated she did not sleep last night continues to have chest heaviness and sob this morning.Has took NTG x 1 with relief.Advised needs to go to [facility] ER.Rosann Auerbach was called and told patient going to ER.

## 2012-12-04 ENCOUNTER — Other Ambulatory Visit: Payer: Self-pay

## 2012-12-04 MED ORDER — HYDRALAZINE HCL 50 MG PO TABS: 50.0000 mg | ORAL_TABLET | Freq: Four times a day (QID) | ORAL | Status: DC

## 2012-12-23 ENCOUNTER — Other Ambulatory Visit: Payer: Self-pay | Admitting: Cardiology

## 2012-12-24 ENCOUNTER — Other Ambulatory Visit: Payer: Self-pay | Admitting: *Deleted

## 2012-12-24 MED ORDER — TORSEMIDE 20 MG PO TABS: 10.0000 mg | ORAL_TABLET | ORAL | Status: DC

## 2012-12-25 ENCOUNTER — Other Ambulatory Visit: Payer: Self-pay | Admitting: Physician Assistant

## 2013-01-09 ENCOUNTER — Other Ambulatory Visit: Payer: Self-pay | Admitting: *Deleted

## 2013-01-09 MED ORDER — MEGESTROL ACETATE 40 MG PO TABS: 160.0000 mg/d | ORAL_TABLET | Freq: Every day | ORAL | Status: DC

## 2013-02-15 ENCOUNTER — Other Ambulatory Visit: Payer: Self-pay | Admitting: Cardiology

## 2013-02-15 ENCOUNTER — Other Ambulatory Visit (HOSPITAL_COMMUNITY): Payer: Self-pay | Admitting: Physician Assistant

## 2013-02-27 ENCOUNTER — Other Ambulatory Visit: Payer: Self-pay | Admitting: *Deleted

## 2013-02-27 MED ORDER — PANTOPRAZOLE SODIUM 40 MG PO TBEC: DELAYED_RELEASE_TABLET | ORAL | Status: DC

## 2013-03-10 ENCOUNTER — Encounter: Payer: Self-pay | Admitting: Family Medicine

## 2013-03-25 ENCOUNTER — Other Ambulatory Visit: Payer: Self-pay

## 2013-03-25 MED ORDER — TORSEMIDE 20 MG PO TABS: ORAL_TABLET | ORAL | Status: DC

## 2013-03-27 ENCOUNTER — Telehealth: Payer: Self-pay

## 2013-03-27 NOTE — Telephone Encounter (Signed)
Called Medicaid to get PA for the Pindolol 5 mg. Reference [id number]

## 2013-03-31 ENCOUNTER — Other Ambulatory Visit: Payer: Self-pay | Admitting: Cardiology

## 2013-04-18 ENCOUNTER — Other Ambulatory Visit (HOSPITAL_COMMUNITY): Payer: Self-pay | Admitting: Nurse Practitioner

## 2013-05-09 ENCOUNTER — Other Ambulatory Visit: Payer: Self-pay

## 2013-05-09 MED ORDER — TORSEMIDE 20 MG PO TABS: ORAL_TABLET | ORAL | Status: DC

## 2013-05-12 ENCOUNTER — Other Ambulatory Visit: Payer: Self-pay

## 2013-05-12 MED ORDER — PANTOPRAZOLE SODIUM 40 MG PO TBEC: DELAYED_RELEASE_TABLET | ORAL | Status: DC

## 2013-05-29 ENCOUNTER — Other Ambulatory Visit: Payer: Self-pay | Admitting: Cardiology

## 2013-05-31 ENCOUNTER — Other Ambulatory Visit: Payer: Self-pay | Admitting: Cardiology

## 2013-06-02 ENCOUNTER — Other Ambulatory Visit: Payer: Self-pay

## 2013-07-03 ENCOUNTER — Other Ambulatory Visit: Payer: Self-pay | Admitting: *Deleted

## 2013-07-03 MED ORDER — PANTOPRAZOLE SODIUM 40 MG PO TBEC: DELAYED_RELEASE_TABLET | ORAL | Status: DC

## 2013-07-04 ENCOUNTER — Telehealth: Payer: Self-pay

## 2013-07-07 ENCOUNTER — Other Ambulatory Visit: Payer: Self-pay | Admitting: Cardiology

## 2013-07-07 NOTE — Telephone Encounter (Signed)
Pt can obtain OTC

## 2013-07-09 ENCOUNTER — Other Ambulatory Visit: Payer: Self-pay

## 2013-07-09 MED ORDER — PANTOPRAZOLE SODIUM 40 MG PO TBEC: DELAYED_RELEASE_TABLET | ORAL | Status: DC

## 2013-07-09 NOTE — Telephone Encounter (Signed)
We can fill for two months.  Patient has follow up in May and will need to keep that appt in order to continue to get refills.

## 2013-07-11 DEATH — deceased

## 2013-07-21 ENCOUNTER — Other Ambulatory Visit (HOSPITAL_COMMUNITY): Payer: Self-pay | Admitting: Cardiology

## 2013-07-29 ENCOUNTER — Encounter (INDEPENDENT_AMBULATORY_CARE_PROVIDER_SITE_OTHER): Payer: Self-pay

## 2013-07-29 ENCOUNTER — Ambulatory Visit (INDEPENDENT_AMBULATORY_CARE_PROVIDER_SITE_OTHER): Payer: Medicaid Other | Admitting: Cardiology

## 2013-07-29 ENCOUNTER — Encounter: Payer: Self-pay | Admitting: Cardiology

## 2013-07-29 VITALS — BP 102/62 | Ht <= 58 in | Wt 95.0 lb

## 2013-07-29 DIAGNOSIS — I4892 Unspecified atrial flutter: Secondary | ICD-10-CM

## 2013-07-29 DIAGNOSIS — I200 Unstable angina: Secondary | ICD-10-CM

## 2013-07-29 MED ORDER — PANTOPRAZOLE SODIUM 40 MG PO TBEC: DELAYED_RELEASE_TABLET | ORAL | Status: DC

## 2013-07-29 MED ORDER — HYDRALAZINE HCL 50 MG PO TABS: ORAL_TABLET | ORAL | Status: DC

## 2013-07-29 MED ORDER — ISOSORBIDE MONONITRATE ER 30 MG PO TB24: ORAL_TABLET | ORAL | Status: DC

## 2013-07-29 MED ORDER — TORSEMIDE 20 MG PO TABS: ORAL_TABLET | ORAL | Status: DC

## 2013-07-29 NOTE — Progress Notes (Signed)
HPI The patient is doing well since I last saw her. She lives at home and her daughters take care of her.  She speaks through her daughter and the only thing that she says is that she cannot sleep.  Her daughter reports palpitations about once monthly.  However, she is given an extra pindolol and her heart rate improves.  The patient denies any new symptoms such as chest discomfort, neck or arm discomfort. There has been no new shortness of breath, PND or orthopnea. There have been no reported presyncope or syncope.  She has had no acute symptoms since she was in the hospital last year.    No Known Allergies  Current Outpatient Prescriptions  Medication Sig Dispense Refill  . aspirin EC 81 MG tablet Take 81 mg by mouth daily.      . bisacodyl (DULCOLAX) 10 MG suppository Place 10 mg rectally daily as needed for constipation.      . ciprofloxacin (CIPRO) 250 MG tablet Take 1 tablet (250 mg total) by mouth daily.  7 tablet  0  . diclofenac sodium (VOLTAREN) 1 % GEL Apply 2 g topically 4 (four) times daily.  1 Tube  3  . hydrALAZINE (APRESOLINE) 50 MG tablet TAKE 1 TABLET (50 MG TOTAL) BY MOUTH 4 (FOUR) TIMES DAILY.  120 tablet  1  . isosorbide mononitrate (IMDUR) 30 MG 24 hr tablet TAKE 0.5 TABLETS (15 MG TOTAL) BY MOUTH DAILY.  15 tablet  0  . megestrol (MEGACE) 40 MG tablet Take 4 tablets (160 mg total) by mouth daily.  120 tablet  6  . Melatonin 5 MG TABS Take 10 mg by mouth at bedtime.       . nitroGLYCERIN (NITROSTAT) 0.4 MG SL tablet Place 1 tablet (0.4 mg total) under the tongue every 5 (five) minutes as needed for chest pain.  25 tablet  3  . ondansetron (ZOFRAN) 4 MG tablet Take 1 tablet (4 mg total) by mouth every 8 (eight) hours as needed for nausea.  20 tablet  0  . oxyCODONE (OXY IR/ROXICODONE) 5 MG immediate release tablet Take 1 tablet (5 mg total) by mouth every 4 (four) hours as needed for pain.  90 tablet  0  . pantoprazole (PROTONIX) 40 MG tablet TAKE 1 TABLET (40 MG TOTAL) BY  MOUTH DAILY.  30 tablet  0  . pindolol (VISKEN) 5 MG tablet Take 1 tablet (5 mg total) by mouth 2 (two) times daily. Take 5mg  in the am and 2.5mg  in the pm  60 tablet  11  . senna (SENOKOT) 8.6 MG TABS Take 2 tablets (17.2 mg total) by mouth daily.  60 each  3  . torsemide (DEMADEX) 20 MG tablet TAKE 1/2 (ONE-HALF) TABLET (10 MG TOTAL) BY MOUTH EVERY TUESDAY, THURSDAY, AND SATURDAY AT 6 PM. *CALL OFFICE FOR FOLLOW UP APPOINTMENT*  6 tablet  0   No current facility-administered medications for this visit.    Past Medical History  Diagnosis Date  . Paroxysmal atrial flutter [facility] admission 2/5-04/20/2010    High fall risk-not a Coumadin candidate  . Hypertension   . Chronic diastolic heart failure     a. Echo February 2012: EF 55%; moderate LVH; mild AI; mild MR; PASP 39  . Sick sinus syndrome     a. Limits use of beta blocker-pindolol tolerated  . CAD (coronary artery disease) PRESUMED    a. 04/2010 NSTEMI->med rx;  b. 09/2012 inf stemi->med rx.  . Arthritis   . Anxiety   .  Hydronephrosis     Chronic  . Chronic back pain   . Stroke   . Herpes zoster 03/01/2012    Past Surgical History  Procedure Laterality Date  . Esophagogastroduodenoscopy  04/19/2011    Procedure: ESOPHAGOGASTRODUODENOSCOPY (EGD);  Surgeon: Erick BlinksJay Pyrtle, MD;  [facility];  Service: Gastroenterology;  Laterality: N/A;    ROS: As stated in the HPI and negative for all other systems. (Per her daughter.)  PHYSICAL EXAM There were no vitals taken for this visit. GEN:  No distress, very frail NECK:  No jugular venous distention at 90 degrees, waveform within normal limits, carotid upstroke brisk and symmetric, no bruits, no thyromegalyI LUNGS:  Clear to auscultation bilaterally BACK:  No CVA tenderness CHEST:  Unremarkable HEART:  S1 and S2 within normal limits, no S3, no S4, no clicks, no rubs, no murmurs ABD:  Positive bowel sounds normal in frequency in pitch, no bruits, no rebound, no guarding, unable to  assess midline mass or bruit with the patient seated. EXT:  2 plus pulses throughout, trace edema, no cyanosis no clubbing    ASSESSMENT AND PLAN  1. Hypertension:  The blood pressure is at target. No change in medications is indicated. We will continue with therapeutic lifestyle changes (TLC).  2. Chronic Diastolic CHF: She seems to be euvolemic. She is avoiding salt. We will continue the meds as listed.  3. Atrial Flutter: She is maintaining sinus rhythm for the most part and she can take an extra pindolol as she has been if she has any rapid rates.

## 2013-07-29 NOTE — Patient Instructions (Signed)
Your physician recommends that you continue on your current medications as directed. Please refer to the Current Medication list given to you today.  Follow up as needed  

## 2013-08-15 ENCOUNTER — Telehealth: Payer: Self-pay | Admitting: Cardiology

## 2013-08-15 NOTE — Telephone Encounter (Signed)
Reprinted paperwork and will have Dr Antoine Poche sign it on Monday.

## 2013-08-15 NOTE — Telephone Encounter (Signed)
Paperwork is scanned in EPIC under media.  Asking Dr Antoine Poche if he has the original to be signed.

## 2013-08-15 NOTE — Telephone Encounter (Signed)
New message      Daughter is trying to track down FMLA papers. Daughter want to talk to Pam--she is employed by [employer] and gets points everytime she is out with her mom.

## 2013-08-17 ENCOUNTER — Other Ambulatory Visit: Payer: Self-pay | Admitting: Physician Assistant

## 2013-08-19 ENCOUNTER — Telehealth: Payer: Self-pay | Admitting: Cardiology

## 2013-08-19 NOTE — Telephone Encounter (Signed)
Paperwork completed and signed - taken to MR to be scanned and for the pt's daughter to be called about picking up.

## 2013-08-19 NOTE — Telephone Encounter (Signed)
Sure

## 2013-08-19 NOTE — Telephone Encounter (Signed)
Daughter aware FMLA ready For pick Up, she Will come by Wednesday and pick up 6.9.15/km

## 2013-08-22 NOTE — Telephone Encounter (Signed)
Ok to fill 

## 2013-08-22 NOTE — Telephone Encounter (Signed)
OK to refill

## 2013-09-03 IMAGING — CR DG CHEST 1V PORT
1 series · 1 of 1 positions shown · non-contrast
Comparison: Prior chest x-ray and CT scan of the chest [DATE] and
07/26/2012 respectively

CLINICAL DATA: Pulmonary crackles on auscultation

PORTABLE CHEST - 1 VIEW

[AP]
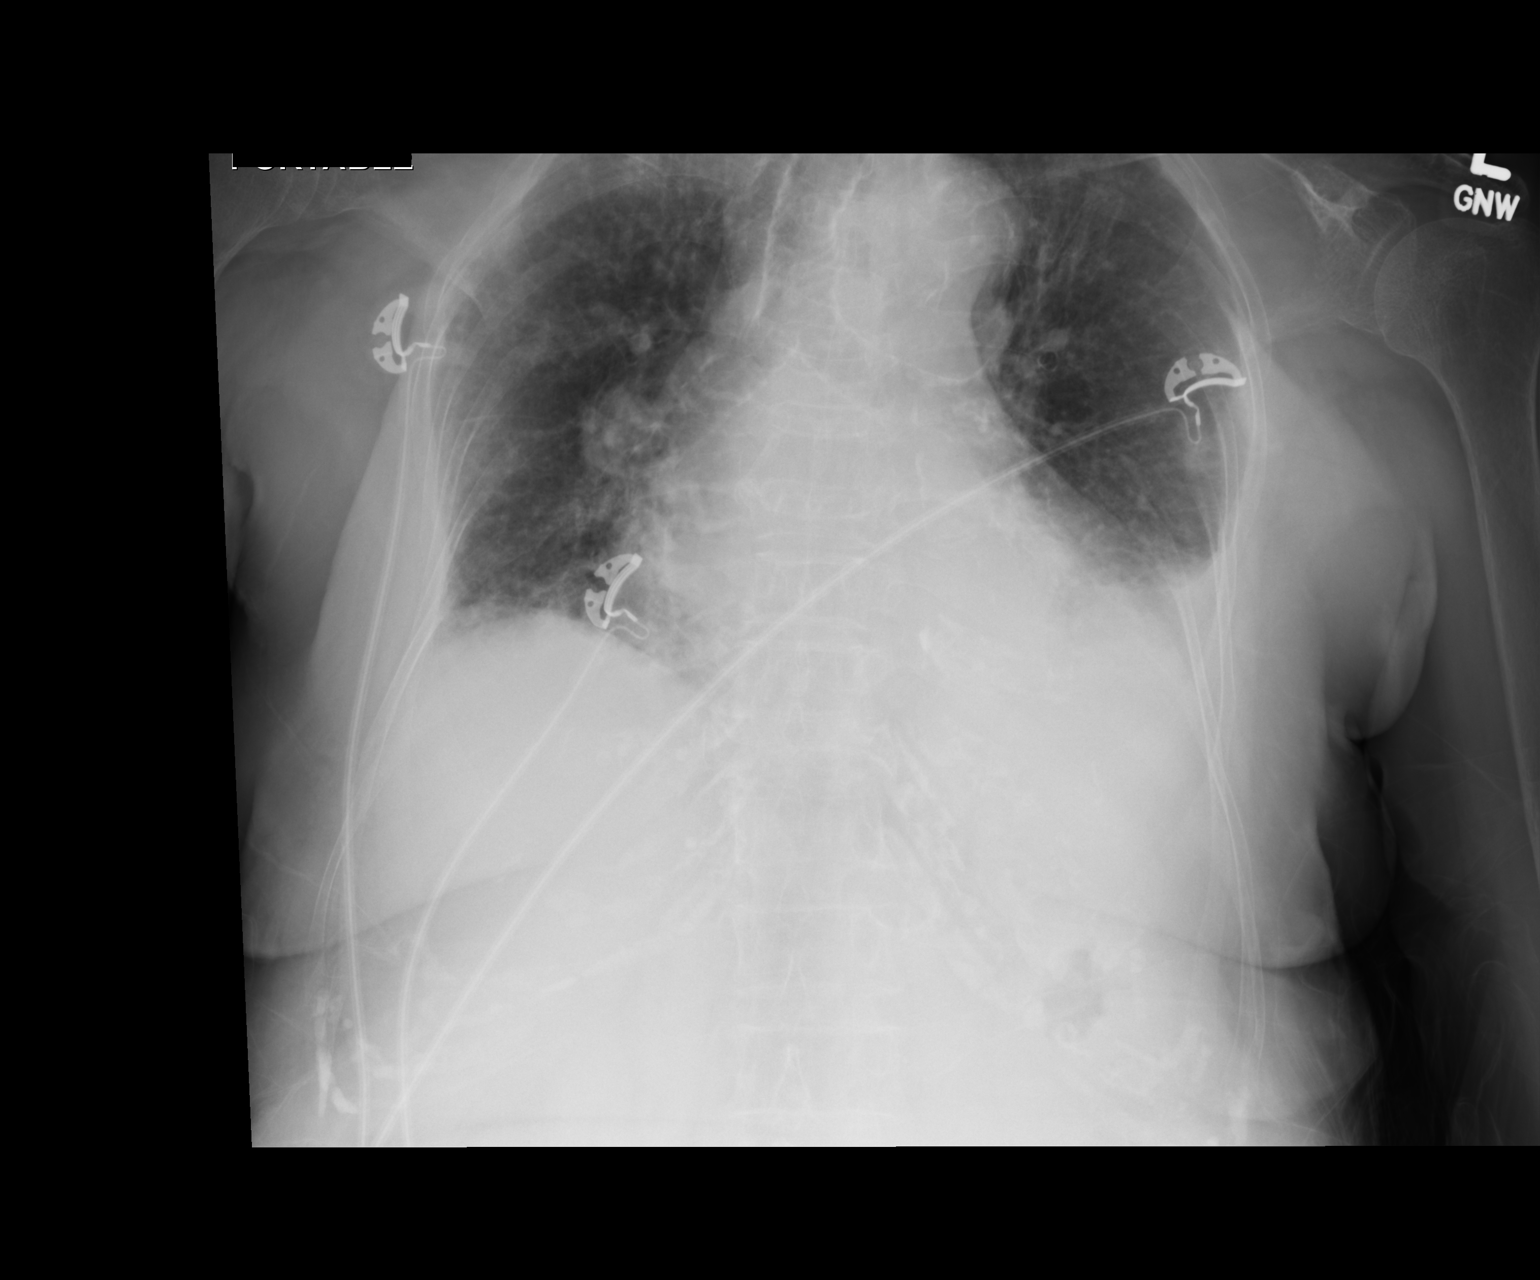

[1 of 1 positions shown; findings below may reference images not displayed]

FINDINGS: Stable appearance of the lungs with extensive background
changes including emphysema, chronic bronchitic changes, and
peripheral accentuation of the interstitial markings suggesting
early fibrosis.  There is cardiomegaly with marked left atrial and
ventricular enlargement extending to the left chest wall.  Aortic
atherosclerosis again noted.  No acute osseous abnormality.
IMPRESSION: 1.  No acute cardiopulmonary disease
2.  Extensive background chronic lung disease including emphysema,
bronchitic changes and possible early pulmonary fibrosis
3. Stable cardiomegaly with left atrial and left ventricular
enlargement
4.  Aortic atherosclerosis

## 2013-10-03 ENCOUNTER — Other Ambulatory Visit: Payer: Self-pay | Admitting: *Deleted

## 2013-10-03 MED ORDER — DICLOFENAC SODIUM 1 % TD GEL: 2.0000 g | Freq: Four times a day (QID) | TRANSDERMAL | Status: AC

## 2013-10-17 ENCOUNTER — Other Ambulatory Visit (HOSPITAL_COMMUNITY): Payer: Self-pay | Admitting: Physician Assistant

## 2013-10-17 ENCOUNTER — Other Ambulatory Visit: Payer: Self-pay

## 2013-10-17 ENCOUNTER — Telehealth: Payer: Self-pay | Admitting: Cardiology

## 2013-10-17 MED ORDER — PINDOLOL 5 MG PO TABS: ORAL_TABLET | ORAL | Status: DC

## 2013-10-17 NOTE — Telephone Encounter (Signed)
Italyhad (the pharmacist) called in regards to a rx written for Pindolol. He stated that there were 2 sets of directions for the script and would like to know which is the correct one. Please call  Thanks

## 2013-10-20 IMAGING — CR DG CHEST 1V PORT
1 series · 1 of 1 positions shown · non-contrast
Comparison: 09/24/2012

CLINICAL DATA: Chest pain

PORTABLE CHEST - 1 VIEW

[AP]
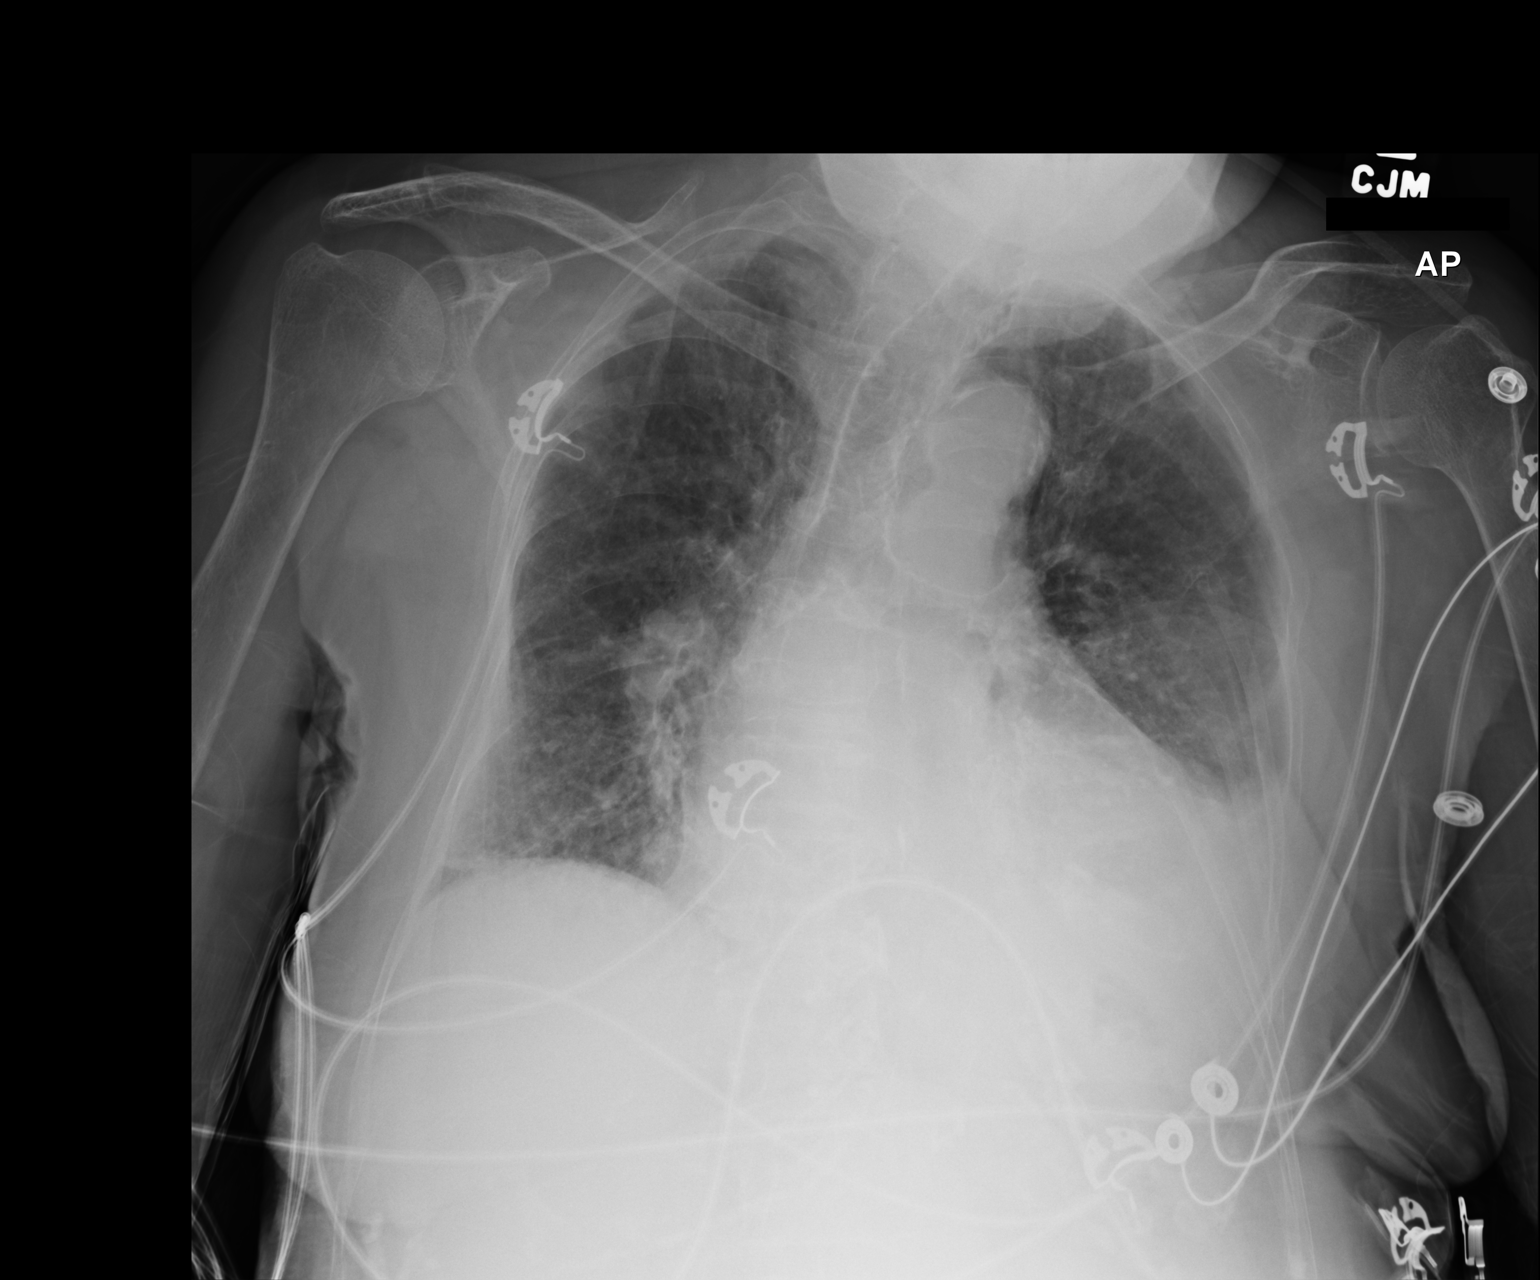

[1 of 1 positions shown; findings below may reference images not displayed]

FINDINGS: The cardiac shadow is stable.  Left basilar infiltrate
with associated effusion is identified.  Mild interstitial changes
are again seen.  No bony abnormality is noted.
IMPRESSION: Left lateral basilar infiltrate.

## 2013-10-29 NOTE — Telephone Encounter (Signed)
Was this handled?

## 2013-11-10 ENCOUNTER — Inpatient Hospital Stay (HOSPITAL_COMMUNITY)
Admission: EM | Admit: 2013-11-10 | Discharge: 2013-11-12 | DRG: 189 | Disposition: A | Discharge status: Home / Self Care | Payer: Medicaid Other | Attending: Family Medicine | Admitting: Family Medicine

## 2013-11-10 ENCOUNTER — Encounter (HOSPITAL_COMMUNITY): Payer: Self-pay | Admitting: Emergency Medicine

## 2013-11-10 ENCOUNTER — Emergency Department (HOSPITAL_COMMUNITY): Payer: Medicaid Other

## 2013-11-10 DIAGNOSIS — Z515 Encounter for palliative care: Secondary | ICD-10-CM

## 2013-11-10 DIAGNOSIS — Z978 Presence of other specified devices: Secondary | ICD-10-CM

## 2013-11-10 DIAGNOSIS — E875 Hyperkalemia: Secondary | ICD-10-CM

## 2013-11-10 DIAGNOSIS — I2510 Atherosclerotic heart disease of native coronary artery without angina pectoris: Secondary | ICD-10-CM | POA: Diagnosis present

## 2013-11-10 DIAGNOSIS — Z79899 Other long term (current) drug therapy: Secondary | ICD-10-CM | POA: Diagnosis not present

## 2013-11-10 DIAGNOSIS — R339 Retention of urine, unspecified: Secondary | ICD-10-CM | POA: Diagnosis present

## 2013-11-10 DIAGNOSIS — Z7982 Long term (current) use of aspirin: Secondary | ICD-10-CM | POA: Diagnosis not present

## 2013-11-10 DIAGNOSIS — I4892 Unspecified atrial flutter: Secondary | ICD-10-CM | POA: Diagnosis not present

## 2013-11-10 DIAGNOSIS — N179 Acute kidney failure, unspecified: Secondary | ICD-10-CM

## 2013-11-10 DIAGNOSIS — I120 Hypertensive chronic kidney disease with stage 5 chronic kidney disease or end stage renal disease: Secondary | ICD-10-CM | POA: Diagnosis present

## 2013-11-10 DIAGNOSIS — Z66 Do not resuscitate: Secondary | ICD-10-CM | POA: Diagnosis present

## 2013-11-10 DIAGNOSIS — N186 End stage renal disease: Secondary | ICD-10-CM | POA: Diagnosis present

## 2013-11-10 DIAGNOSIS — I252 Old myocardial infarction: Secondary | ICD-10-CM

## 2013-11-10 DIAGNOSIS — E872 Acidosis, unspecified: Secondary | ICD-10-CM | POA: Diagnosis present

## 2013-11-10 DIAGNOSIS — N183 Chronic kidney disease, stage 3 unspecified: Secondary | ICD-10-CM | POA: Diagnosis not present

## 2013-11-10 DIAGNOSIS — I359 Nonrheumatic aortic valve disorder, unspecified: Secondary | ICD-10-CM | POA: Diagnosis present

## 2013-11-10 DIAGNOSIS — I5033 Acute on chronic diastolic (congestive) heart failure: Secondary | ICD-10-CM

## 2013-11-10 DIAGNOSIS — J9600 Acute respiratory failure, unspecified whether with hypoxia or hypercapnia: Principal | ICD-10-CM

## 2013-11-10 DIAGNOSIS — E43 Unspecified severe protein-calorie malnutrition: Secondary | ICD-10-CM | POA: Diagnosis present

## 2013-11-10 DIAGNOSIS — R0989 Other specified symptoms and signs involving the circulatory and respiratory systems: Secondary | ICD-10-CM

## 2013-11-10 DIAGNOSIS — I200 Unstable angina: Secondary | ICD-10-CM | POA: Diagnosis present

## 2013-11-10 DIAGNOSIS — R4181 Age-related cognitive decline: Secondary | ICD-10-CM | POA: Diagnosis present

## 2013-11-10 DIAGNOSIS — R0603 Acute respiratory distress: Secondary | ICD-10-CM | POA: Diagnosis present

## 2013-11-10 DIAGNOSIS — I25119 Atherosclerotic heart disease of native coronary artery with unspecified angina pectoris: Secondary | ICD-10-CM

## 2013-11-10 DIAGNOSIS — Z960 Presence of urogenital implants: Secondary | ICD-10-CM

## 2013-11-10 DIAGNOSIS — Z8673 Personal history of transient ischemic attack (TIA), and cerebral infarction without residual deficits: Secondary | ICD-10-CM | POA: Diagnosis not present

## 2013-11-10 DIAGNOSIS — J9602 Acute respiratory failure with hypercapnia: Secondary | ICD-10-CM

## 2013-11-10 DIAGNOSIS — I495 Sick sinus syndrome: Secondary | ICD-10-CM

## 2013-11-10 DIAGNOSIS — R7989 Other specified abnormal findings of blood chemistry: Secondary | ICD-10-CM

## 2013-11-10 DIAGNOSIS — I5043 Acute on chronic combined systolic (congestive) and diastolic (congestive) heart failure: Secondary | ICD-10-CM | POA: Diagnosis present

## 2013-11-10 DIAGNOSIS — N19 Unspecified kidney failure: Secondary | ICD-10-CM

## 2013-11-10 DIAGNOSIS — M549 Dorsalgia, unspecified: Secondary | ICD-10-CM | POA: Diagnosis present

## 2013-11-10 DIAGNOSIS — I5032 Chronic diastolic (congestive) heart failure: Secondary | ICD-10-CM | POA: Diagnosis present

## 2013-11-10 DIAGNOSIS — J189 Pneumonia, unspecified organism: Secondary | ICD-10-CM | POA: Diagnosis present

## 2013-11-10 DIAGNOSIS — I509 Heart failure, unspecified: Secondary | ICD-10-CM | POA: Diagnosis present

## 2013-11-10 DIAGNOSIS — N184 Chronic kidney disease, stage 4 (severe): Secondary | ICD-10-CM | POA: Diagnosis present

## 2013-11-10 DIAGNOSIS — R0609 Other forms of dyspnea: Secondary | ICD-10-CM

## 2013-11-10 DIAGNOSIS — R0601 Orthopnea: Secondary | ICD-10-CM | POA: Diagnosis present

## 2013-11-10 DIAGNOSIS — G8929 Other chronic pain: Secondary | ICD-10-CM | POA: Diagnosis present

## 2013-11-10 DIAGNOSIS — I5023 Acute on chronic systolic (congestive) heart failure: Secondary | ICD-10-CM

## 2013-11-10 DIAGNOSIS — I10 Essential (primary) hypertension: Secondary | ICD-10-CM

## 2013-11-10 HISTORY — DX: Acute respiratory distress: R06.03

## 2013-11-10 HISTORY — DX: Acute myocardial infarction, unspecified: I21.9

## 2013-11-10 HISTORY — DX: Presence of other specified devices: Z97.8

## 2013-11-10 HISTORY — DX: Presence of urogenital implants: Z96.0

## 2013-11-10 LAB — COMPREHENSIVE METABOLIC PANEL
ALT: 15 U/L (ref 0–35)
ANION GAP: 13 (ref 5–15)
AST: 21 U/L (ref 0–37)
Albumin: 3.3 g/dL — ABNORMAL LOW (ref 3.5–5.2)
Alkaline Phosphatase: 52 U/L (ref 39–117)
BUN: 33 mg/dL — AB (ref 6–23)
CALCIUM: 8.8 mg/dL (ref 8.4–10.5)
CO2: 23 meq/L (ref 19–32)
CREATININE: 1.76 mg/dL — AB (ref 0.50–1.10)
Chloride: 106 mEq/L (ref 96–112)
GFR calc non Af Amer: 23 mL/min — ABNORMAL LOW (ref >90)
GFR, EST AFRICAN AMERICAN: 27 mL/min — AB (ref >90)
GLUCOSE: 135 mg/dL — AB (ref 70–99)
Potassium: 5.6 mEq/L — ABNORMAL HIGH (ref 3.7–5.3)
Sodium: 142 mEq/L (ref 137–147)
TOTAL PROTEIN: 8.2 g/dL (ref 6.0–8.3)
Total Bilirubin: 0.3 mg/dL (ref 0.3–1.2)

## 2013-11-10 LAB — BASIC METABOLIC PANEL
Anion gap: 14 (ref 5–15)
BUN: 32 mg/dL — ABNORMAL HIGH (ref 6–23)
CHLORIDE: 106 meq/L (ref 96–112)
CO2: 21 mEq/L (ref 19–32)
Calcium: 8.9 mg/dL (ref 8.4–10.5)
Creatinine, Ser: 1.75 mg/dL — ABNORMAL HIGH (ref 0.50–1.10)
GFR calc Af Amer: 27 mL/min — ABNORMAL LOW (ref >90)
GFR, EST NON AFRICAN AMERICAN: 24 mL/min — AB (ref >90)
GLUCOSE: 122 mg/dL — AB (ref 70–99)
POTASSIUM: 5.8 meq/L — AB (ref 3.7–5.3)
SODIUM: 141 meq/L (ref 137–147)

## 2013-11-10 LAB — CBC WITH DIFFERENTIAL/PLATELET
Basophils Absolute: 0 10*3/uL (ref 0.0–0.1)
Basophils Relative: 0 % (ref 0–1)
Eosinophils Absolute: 0.2 10*3/uL (ref 0.0–0.7)
Eosinophils Relative: 2 % (ref 0–5)
HCT: 37.1 % (ref 36.0–46.0)
HEMOGLOBIN: 11.9 g/dL — AB (ref 12.0–15.0)
LYMPHS PCT: 24 % (ref 12–46)
Lymphs Abs: 2.4 10*3/uL (ref 0.7–4.0)
MCH: 30.1 pg (ref 26.0–34.0)
MCHC: 32.1 g/dL (ref 30.0–36.0)
MCV: 93.9 fL (ref 78.0–100.0)
Monocytes Absolute: 0.9 10*3/uL (ref 0.1–1.0)
Monocytes Relative: 9 % (ref 3–12)
Neutro Abs: 6.6 10*3/uL (ref 1.7–7.7)
Neutrophils Relative %: 65 % (ref 43–77)
PLATELETS: 283 10*3/uL (ref 150–400)
RBC: 3.95 MIL/uL (ref 3.87–5.11)
RDW: 13.1 % (ref 11.5–15.5)
WBC: 10 10*3/uL (ref 4.0–10.5)

## 2013-11-10 LAB — I-STAT CHEM 8, ED
BUN: 34 mg/dL — AB (ref 6–23)
Calcium, Ion: 1.14 mmol/L (ref 1.13–1.30)
Chloride: 115 mEq/L — ABNORMAL HIGH (ref 96–112)
Creatinine, Ser: 2.1 mg/dL — ABNORMAL HIGH (ref 0.50–1.10)
Glucose, Bld: 118 mg/dL — ABNORMAL HIGH (ref 70–99)
HCT: 41 % (ref 36.0–46.0)
Hemoglobin: 13.9 g/dL (ref 12.0–15.0)
POTASSIUM: 5.5 meq/L — AB (ref 3.7–5.3)
SODIUM: 141 meq/L (ref 137–147)
TCO2: 24 mmol/L (ref 0–100)

## 2013-11-10 LAB — MRSA PCR SCREENING: MRSA by PCR: NEGATIVE

## 2013-11-10 LAB — CBC
HEMATOCRIT: 35.5 % — AB (ref 36.0–46.0)
HEMOGLOBIN: 11.4 g/dL — AB (ref 12.0–15.0)
MCH: 30.7 pg (ref 26.0–34.0)
MCHC: 32.1 g/dL (ref 30.0–36.0)
MCV: 95.7 fL (ref 78.0–100.0)
Platelets: 234 10*3/uL (ref 150–400)
RBC: 3.71 MIL/uL — AB (ref 3.87–5.11)
RDW: 13.2 % (ref 11.5–15.5)
WBC: 7.9 10*3/uL (ref 4.0–10.5)

## 2013-11-10 LAB — PRO B NATRIURETIC PEPTIDE
Pro B Natriuretic peptide (BNP): 5631 pg/mL — ABNORMAL HIGH (ref 0–450)
Pro B Natriuretic peptide (BNP): 7907 pg/mL — ABNORMAL HIGH (ref 0–450)

## 2013-11-10 LAB — PHOSPHORUS: Phosphorus: 5.8 mg/dL — ABNORMAL HIGH (ref 2.3–4.6)

## 2013-11-10 LAB — I-STAT ARTERIAL BLOOD GAS, ED
ACID-BASE DEFICIT: 1 mmol/L (ref 0.0–2.0)
Bicarbonate: 25.4 mEq/L — ABNORMAL HIGH (ref 20.0–24.0)
O2 Saturation: 100 %
Patient temperature: 98.6
TCO2: 27 mmol/L (ref 0–100)
pCO2 arterial: 47.9 mmHg — ABNORMAL HIGH (ref 35.0–45.0)
pH, Arterial: 7.332 — ABNORMAL LOW (ref 7.350–7.450)
pO2, Arterial: 212 mmHg — ABNORMAL HIGH (ref 80.0–100.0)

## 2013-11-10 LAB — TSH: TSH: 1.69 u[IU]/mL (ref 0.350–4.500)

## 2013-11-10 LAB — I-STAT CG4 LACTIC ACID, ED: Lactic Acid, Venous: 2.3 mmol/L — ABNORMAL HIGH (ref 0.5–2.2)

## 2013-11-10 LAB — TROPONIN I
Troponin I: <0.3 ng/mL (ref <0.30)
Troponin I: <0.3 ng/mL (ref <0.30)

## 2013-11-10 LAB — PROTIME-INR
INR: 1.14 (ref 0.00–1.49)
PROTHROMBIN TIME: 14.6 s (ref 11.6–15.2)

## 2013-11-10 LAB — APTT: APTT: 26 s (ref 24–37)

## 2013-11-10 LAB — I-STAT TROPONIN, ED: Troponin i, poc: 0.03 ng/mL (ref 0.00–0.08)

## 2013-11-10 LAB — MAGNESIUM: Magnesium: 2.5 mg/dL (ref 1.5–2.5)

## 2013-11-10 MED ORDER — SODIUM CHLORIDE 0.9 % IJ SOLN
3.0000 mL | Freq: Two times a day (BID) | INTRAMUSCULAR | Status: DC
  Administered 2013-11-10 – 2013-11-12 (×4): 3 mL via INTRAVENOUS

## 2013-11-10 MED ORDER — FUROSEMIDE 10 MG/ML IJ SOLN
40.0000 mg | INTRAMUSCULAR | Status: AC
  Administered 2013-11-10: 40 mg via INTRAVENOUS
  Filled 2013-11-10: qty 4

## 2013-11-10 MED ORDER — PINDOLOL 5 MG PO TABS
2.5000 mg | ORAL_TABLET | Freq: Every day | ORAL | Status: DC
  Administered 2013-11-10 – 2013-11-11 (×2): 2.5 mg via ORAL
  Filled 2013-11-10 (×3): qty 1

## 2013-11-10 MED ORDER — SODIUM CHLORIDE 0.9 % IV SOLN: 250.0000 mL | INTRAVENOUS | Status: DC | PRN

## 2013-11-10 MED ORDER — NITROGLYCERIN 0.4 MG SL SUBL: 0.4000 mg | SUBLINGUAL_TABLET | SUBLINGUAL | Status: DC | PRN

## 2013-11-10 MED ORDER — ADULT MULTIVITAMIN W/MINERALS CH
1.0000 | ORAL_TABLET | Freq: Every day | ORAL | Status: DC
  Administered 2013-11-10 – 2013-11-12 (×3): 1 via ORAL
  Filled 2013-11-10 (×3): qty 1

## 2013-11-10 MED ORDER — ALBUTEROL (5 MG/ML) CONTINUOUS INHALATION SOLN: 10.0000 mg/h | INHALATION_SOLUTION | RESPIRATORY_TRACT | Status: DC

## 2013-11-10 MED ORDER — SODIUM CHLORIDE 0.9 % IJ SOLN: 3.0000 mL | INTRAMUSCULAR | Status: DC | PRN

## 2013-11-10 MED ORDER — FUROSEMIDE 10 MG/ML IJ SOLN
40.0000 mg | Freq: Two times a day (BID) | INTRAMUSCULAR | Status: AC
  Administered 2013-11-10 (×2): 40 mg via INTRAVENOUS
  Filled 2013-11-10 (×2): qty 4

## 2013-11-10 MED ORDER — ISOSORBIDE MONONITRATE ER 30 MG PO TB24
30.0000 mg | ORAL_TABLET | Freq: Every day | ORAL | Status: DC
  Administered 2013-11-10: 15 mg via ORAL
  Administered 2013-11-11 – 2013-11-12 (×2): 30 mg via ORAL
  Filled 2013-11-10 (×3): qty 1

## 2013-11-10 MED ORDER — ONDANSETRON HCL 4 MG/2ML IJ SOLN
4.0000 mg | Freq: Once | INTRAMUSCULAR | Status: AC
  Administered 2013-11-10: 4 mg via INTRAVENOUS
  Filled 2013-11-10: qty 2

## 2013-11-10 MED ORDER — ACETAMINOPHEN 325 MG PO TABS: 650.0000 mg | ORAL_TABLET | Freq: Four times a day (QID) | ORAL | Status: DC | PRN

## 2013-11-10 MED ORDER — ACETAMINOPHEN 650 MG RE SUPP: 650.0000 mg | Freq: Four times a day (QID) | RECTAL | Status: DC | PRN

## 2013-11-10 MED ORDER — ISOSORBIDE MONONITRATE 15 MG HALF TABLET
15.0000 mg | ORAL_TABLET | Freq: Every day | ORAL | Status: DC
  Administered 2013-11-10: 15 mg via ORAL
  Filled 2013-11-10: qty 1

## 2013-11-10 MED ORDER — HYDRALAZINE HCL 50 MG PO TABS
50.0000 mg | ORAL_TABLET | Freq: Four times a day (QID) | ORAL | Status: DC
  Administered 2013-11-10 – 2013-11-12 (×9): 50 mg via ORAL
  Filled 2013-11-10 (×12): qty 1

## 2013-11-10 MED ORDER — ENOXAPARIN SODIUM 30 MG/0.3ML ~~LOC~~ SOLN
20.0000 mg | SUBCUTANEOUS | Status: DC
  Administered 2013-11-10 – 2013-11-11 (×2): 20 mg via SUBCUTANEOUS
  Filled 2013-11-10 (×4): qty 0.2

## 2013-11-10 MED ORDER — CHLORHEXIDINE GLUCONATE 0.12 % MT SOLN
15.0000 mL | Freq: Two times a day (BID) | OROMUCOSAL | Status: DC
  Administered 2013-11-10 – 2013-11-12 (×4): 15 mL via OROMUCOSAL
  Filled 2013-11-10 (×6): qty 15

## 2013-11-10 MED ORDER — SODIUM CHLORIDE 0.9 % IJ SOLN
3.0000 mL | Freq: Two times a day (BID) | INTRAMUSCULAR | Status: DC
  Administered 2013-11-10 – 2013-11-12 (×5): 3 mL via INTRAVENOUS

## 2013-11-10 MED ORDER — PINDOLOL 5 MG PO TABS
5.0000 mg | ORAL_TABLET | Freq: Every day | ORAL | Status: DC
  Administered 2013-11-10 – 2013-11-12 (×3): 5 mg via ORAL
  Filled 2013-11-10 (×3): qty 1

## 2013-11-10 MED ORDER — PANTOPRAZOLE SODIUM 40 MG PO TBEC
40.0000 mg | DELAYED_RELEASE_TABLET | Freq: Every day | ORAL | Status: DC
  Administered 2013-11-10 – 2013-11-12 (×3): 40 mg via ORAL
  Filled 2013-11-10 (×3): qty 1

## 2013-11-10 MED ORDER — ASPIRIN EC 81 MG PO TBEC
81.0000 mg | DELAYED_RELEASE_TABLET | Freq: Every day | ORAL | Status: DC
  Administered 2013-11-10 – 2013-11-12 (×3): 81 mg via ORAL
  Filled 2013-11-10 (×3): qty 1

## 2013-11-10 MED ORDER — TORSEMIDE 10 MG PO TABS
10.0000 mg | ORAL_TABLET | ORAL | Status: DC
  Administered 2013-11-10: 10 mg via ORAL
  Filled 2013-11-10: qty 1

## 2013-11-10 MED ORDER — SODIUM CHLORIDE 0.9 % IV SOLN
Freq: Once | INTRAVENOUS | Status: AC
  Administered 2013-11-10: 500 mL via INTRAVENOUS

## 2013-11-10 MED ORDER — ONDANSETRON HCL 4 MG/2ML IJ SOLN: 4.0000 mg | Freq: Three times a day (TID) | INTRAMUSCULAR | Status: AC | PRN

## 2013-11-10 MED ORDER — MORPHINE SULFATE 4 MG/ML IJ SOLN
2.0000 mg | Freq: Once | INTRAMUSCULAR | Status: AC
  Administered 2013-11-10: 2 mg via INTRAVENOUS
  Filled 2013-11-10: qty 1

## 2013-11-10 NOTE — Care Management Note (Signed)
    Page 1 of 1   11/10/2013     8:35:53 AM CARE MANAGEMENT NOTE 11/10/2013  Patient:  VADIE, PRINCIPATO   Account Number:  [id number]  Date Initiated:  11/10/2013  Documentation initiated by:  Junius Creamer  Subjective/Objective Assessment:   adm w resp distress     Action/Plan:   lives w fam, pcp dr Cristal [address]   Anticipated DC Date:     Anticipated DC Plan:        DC Planning Services  CM consult      Choice offered to / List presented to:             Status of service:   Medicare Important Message given?   (If response is "NO", the following Medicare IM given date fields will be blank) Date Medicare IM given:   Medicare IM given by:   Date Additional Medicare IM given:   Additional Medicare IM given by:    Discharge Disposition:    Per UR Regulation:  Reviewed for med. necessity/level of care/duration of stay  If discussed at Long Length of Stay Meetings, dates discussed:    Comments:  8/31 0835 debbie Elva Breaker rn,bsn will moniter for dc needs as pt progresses.

## 2013-11-10 NOTE — Progress Notes (Signed)
Echocardiogram 2D Echocardiogram has been performed.  Yvette [name] 11/10/2013, 11:07 AM

## 2013-11-10 NOTE — Progress Notes (Signed)
Pt taken off Bipap mask at this time. Placed on 2L [place] with humidity. Daughter states that her breathing looks like her normal again and looks much better. RN pulled pt up in bed and pt given swabs for dry mouth. RT will continue to monitor.

## 2013-11-10 NOTE — Progress Notes (Signed)
Family Medicine PGY-3 PCP Note  I have been directly involved in Ms. Dematteo's care as part of the current inpt family medicine teaching service. I appreciate the excellent care provided by the other inpatient FPTS providers and all consultants as well as all nursing and other ancillary staff on behalf of my patient. Please do not hesitate to contact me if I can be of any help.  Bobbye Morton, MD PGY-3, [facility] Family Medicine 11/10/2013, 7:19 AM First call: FPTS Service pager: [phone] (text pages welcome through [facility]) Personal pager: [phone]

## 2013-11-10 NOTE — H&P (Signed)
Family Medicine Teaching [facility] Admission History and Physical Service Pager: [phone]  Patient name: Yvette [name] Medical record number: [id number] Date of birth: 05-May-1916 Age: 80 y.o. Gender: female  Primary Care Provider: Maryjean Ka, MD Consultants: Cardiology Code Status: FULL (discussed on admission)  Chief Complaint: SOB   Assessment and Plan: Yvette [name] is a 80 y.o. female presenting with SOB . PMH is significant for CHF, CKD 3, paroxysmal a-flutter, HTN, CAD, STEMI, unstable angina   Acute respiratory failure: Respiratory acidosis (pH7.332, pCO2 47.9, Bicarb 25.4). Hypoxic on admission (O2 60%'s). CHF exacerbation (CXR stable cardiomegaly and b/l interstitial edema, trace LE edema on exam, JVD to jawline, and Pro-BNP elevation) vs. MI (History of NSTEMI and STEMI in 2012, first trop neg, EKG shows ongoing inferior ST elevation and lateral T wave changes but this is stable from 10/04/13) vs. pneumonia (CXR shows improvement since last in 07/24, no acute processes observed) vs PE (Acute hypoxemia in setting of prolonged baseline immobility) -Admit to cardiac SDU under Dr McDiarmid, vitals per floor protocol -Continue BiPAP 40% O2, P , PEEP 5, RR 18 (O2 saturation at 99% on these settings) -Diuresis per cardiology- received 1 dose of IV lasix in ED; on demadex  3 days a week at home,  -consider repeat CXR in am -repeat EKG in am -cycle troponins -Echo -TSH -consult cardiology, Sees Dr Antoine Poche -consider consult to palliative  Chronic diastolic CHF: Does not appear overtly fluid overloaded on exam, but JVD present to jawline. Last Echo 04/2010: EF 55%, mod LVH. -BNP 5631 (increased from last [phone]) possibly exaggerated by renal insufficiency. -Echo ordered -On torsemide at home 3x weekly, f/u cards rec on diuresis  Unstable angina: -Continue Imdur and BB -Continue ASA  Paroxysmal a-flutter: NSR on EKG -monitor on telemetry  Sick sinus  syndrome: Limits dose of pindolol for rate control of paroxysmal atrial flutter. - Will monitor HR as we cautiously continue beta blocker  AKI on stage III CKD: CrCl is 11 mL/min. Baseline creatinine for her habitus should be quite low. Suspect pre-renal and intra-renal etiology with decreased CO but no significant BUN elevation. Cr 2.10 (baseline 1.2-1.7), BUN 34. -Consider kayexelate if potassium (5.5) does not trend downward with augmented loop diuretic.  -CMP, Mg, phos  HTN: BP 159/69 -Continue Hydralazine and Imdur  Urinary retention: Foley catheter in place. Sees Dr Mena Goes outpatient  -hold uribel  FEN/GI: NPO, saline lock IV, Protonix 40 Prophylaxis: Renally dosed lovenox  Disposition: Admit to cardiac SDU under Dr. McDiarmid  History of Present Illness: Yvette [name] is a 80 y.o. female presenting with SOB.  PMH: CHF, paroxysmal a-flutter, sick sinus-syndrome, HTN, CAD, STEMI, unstable angina, CKD 3, hydronephrosis, generalized weakness  Interpretation is provided by daughter who declines offer for phone interpretor.  Daughter reports that patient began having shortness of breath with CP around 11pm yesterday.  CP was unrelieved by Nitroglycerin over the course of 2 hours and patient was brought to the ED.  Denies increase in weight, LE edema, cough, congestion, sick contacts.  Admits to orthopnea (chronic, unchanged).  Denies any changes in activity level, dietary habits, or changes in medications.  Patient is bed ridden at home, does not independently perform ADLs, and is completely dependent on daughters for care.  Patient sees Dr Antoine Poche for cardiology and Dr Mena Goes for urology.  Last cardiology appointment was 07/29/13.  In ED, patient was hypoxemic with increased work of breathing, placed on Bipap and given 1 dose of IV Lasix.  EKG, CXR, BNP,  and blood gases obtained.   Review Of Systems: Per HPI with the following additions: none Otherwise 12 point review of systems was  performed and was unremarkable.  Patient Active Problem List   Diagnosis Date Noted  . Respiratory distress 11/10/2013  . Unstable angina 10/04/2012  . Abnormal ECG 10/04/2012  . UTI (urinary tract infection) 10/04/2012  . ST elevation myocardial infarction (STEMI) of inferior wall, initial episode of care 09/24/2012  . Loss of weight 08/29/2012  . Vertebral fracture 07/28/2012  . Hydronephrosis 07/25/2012  . CAD (coronary artery disease) 07/25/2012  . Urinary retention 07/25/2012  . Abdominal pain, left upper quadrant 07/24/2012  . Encounter for palliative care 07/24/2012  . Weakness generalized 07/24/2012  . SOB (shortness of breath) 10/05/2011  . Acute on chronic diastolic CHF (congestive heart failure), NYHA class 1 10/05/2011  . HTN (hypertension) 10/05/2011  . Abnormal TSH 10/04/2011  . Fatigue 10/04/2011  . CKD (chronic kidney disease), stage III 04/26/2011  . Physical deconditioning 04/26/2011  . Anxiety 05/05/2010  . Paroxysmal atrial flutter   . Chronic diastolic heart failure   . Sick sinus syndrome   . ATRIAL FLUTTER 05/02/2010  . BRADYCARDIA-TACHYCARDIA SYNDROME 05/02/2010   Past Medical History: Past Medical History  Diagnosis Date  . Paroxysmal atrial flutter [facility] admission 2/5-04/20/2010    High fall risk-not a Coumadin candidate  . Hypertension   . Chronic diastolic heart failure     a. Echo February 2012: EF 55%; moderate LVH; mild AI; mild MR; PASP 39  . Sick sinus syndrome     a. Limits use of beta blocker-pindolol tolerated  . CAD (coronary artery disease) PRESUMED    a. 04/2010 NSTEMI->med rx;  b. 09/2012 inf stemi->med rx.  . Arthritis   . Anxiety   . Hydronephrosis     Chronic  . Chronic back pain   . Stroke   . Herpes zoster 03/01/2012   Past Surgical History: Past Surgical History  Procedure Laterality Date  . Esophagogastroduodenoscopy  04/19/2011    Procedure: ESOPHAGOGASTRODUODENOSCOPY (EGD);  Surgeon: Erick Blinks, MD;  Location: [facility]  ENDOSCOPY;  Service: Gastroenterology;  Laterality: N/A;   Social History: History  Substance Use Topics  . Smoking status: Never Smoker   . Smokeless tobacco: Never Used  . Alcohol Use: No   Additional social history: none  Please also refer to relevant sections of EMR.  Family History: Family History  Problem Relation Age of Onset  . CAD Neg Hx    Allergies and Medications: No Known Allergies No current facility-administered medications on file prior to encounter.   Current Outpatient Prescriptions on File Prior to Encounter  Medication Sig Dispense Refill  . aspirin EC 81 MG tablet Take 81 mg by mouth daily.      . diclofenac sodium (VOLTAREN) 1 % GEL Apply 2 g topically 4 (four) times daily.  1 Tube  5  . Melatonin 5 MG TABS Take 10 mg by mouth at bedtime.       . nitroGLYCERIN (NITROSTAT) 0.4 MG SL tablet Place 1 tablet (0.4 mg total) under the tongue every 5 (five) minutes as needed for chest pain.  25 tablet  3    Objective: BP 127/94  Pulse 91  Temp(Src) 99.4 F (37.4 C) (Rectal)  Resp 15  SpO2 97% Exam: General: thin, elderly female resting in bed, Bipap mask in place, foley catheter in place with BLUE urine, NAD, daughter at bedside HEENT: AT/[place], does not open eyes, mildly full thyroid, no cervical  lymphadenopathy appreciated Cardiovascular: S1S2, RRR, no murmurs appreciated, +JVD to jawline Respiratory: Noted supraclavicular retractions. Decreased air movement b/l, questionable bibasilar crackles (difficult to assess over bipap machine) Abdomen: rotund, soft, NT, +BS Extremities: warm, +1 posterior tibial pulses b/l, peripheral IVs in place in distal forearms Skin: dry, intact, few healed breaks in skin on anterior shins, trace edema at ankles Neuro: gait not assessed, responds to daughter's questions in [place]  Labs and Imaging: CBC BMET   Recent Labs Lab 11/10/13 0156 11/10/13 0203  WBC 10.0  --   HGB 11.9* 13.9  HCT 37.1 41.0  PLT 283  --      Recent Labs Lab 11/10/13 0156 11/10/13 0203  NA 141 141  K 5.8* 5.5*  CL 106 115*  CO2 21  --   BUN 32* 34*  CREATININE 1.75* 2.10*  GLUCOSE 122* 118*  CALCIUM 8.9  --      Dg Chest Port 1 View  11/10/2013   CLINICAL DATA:  Chest Pain  EXAM: PORTABLE CHEST - 1 VIEW  COMPARISON:  10/03/2012  FINDINGS: Stable cardiomegaly. Improved aeration of the left lung base with some residual linear scarring or subsegmental atelectasis. Mild diffuse interstitial edema and central pulmonary vascular congestion. No definite effusion.  Atheromatous aortic arch. Visualized skeletal structures are unremarkable.  IMPRESSION: 1. Stable cardiomegaly and bilateral interstitial edema. 2. Resolution of previously noted left lower lung consolidation.   Electronically Signed   By: Oley Balm M.D.   On: 11/10/2013 02:01    EKG 11/10/13: Sinus rhythm, Probable LVH with secondary repol abnrm, Anterior Q waves, possibly due to LVH, ST elevation- consider inferior injury  Raliegh Ip, DO 11/10/2013, 3:40 AM PGY-1, [facility] FPTS Intern pager: [phone], text pages welcome   I have seen and evaluated the patient with Dr. Nadine Counts. I am in agreement with the note above in its revised form. My additions are in red.  Delaney Schnick B. Jarvis Newcomer, MD, PGY-2 11/10/2013 5:45 AM

## 2013-11-10 NOTE — Progress Notes (Signed)
OT Cancellation Note  Patient Details Name: Yvette [name] MRN: [id number] DOB: 12/09/1916   Cancelled Treatment:    Reason Eval/Treat Not Completed: Other (comment). Pt is on bedrest until after 5PM today, will attempt eval tomorrow.  [name] [phone] 11/10/2013, 7:17 AM

## 2013-11-10 NOTE — ED Provider Notes (Signed)
CSN: [id number]     Arrival date & time 11/10/13  0146 History   First MD Initiated Contact with Patient 11/10/13 [phone]     Chief Complaint  Patient presents with  . Shortness of Breath     (Consider location/radiation/quality/duration/timing/severity/associated sxs/prior Treatment) HPI Comments: The patient is a very sick and 80 year old female with a history of diastolic heart failure, hypertension and sick sinus syndrome. She presents to the hospital with severe respiratory distress which according to the family member started at 11:00 this evening. It was acute in onset, persistent and severe, found to have oxygen saturations of 60% on room air. Required BiPAP in route for adequate oxygenation but the patient remained uncomfortable appearing, very tachycardic and in apparent respiratory distress. She does not speaking much, her family member gives the majority of the history. The family member states that she has not had any swelling of her legs and has not gained any weight according to her daily weights. She does see cardiology and most recently saw them and they. She does have a history of paroxysmal atrial tachycardia is, she is not a Coumadin candidate due to frequent fall risk. She was given a nitroglycerin and an aspirin prior to arrival because she was complaining of chest pain with her shortness of breath. Discussion with the daughter, the patient is a full code.  Patient is a 80 y.o. female presenting with shortness of breath. The history is provided by the EMS personnel, a relative and medical records.  Shortness of Breath   Past Medical History  Diagnosis Date  . Paroxysmal atrial flutter [facility] admission 2/5-04/20/2010    High fall risk-not a Coumadin candidate  . Hypertension   . Chronic diastolic heart failure     a. Echo February 2012: EF 55%; moderate LVH; mild AI; mild MR; PASP 39  . Sick sinus syndrome     a. Limits use of beta blocker-pindolol tolerated  . CAD (coronary  artery disease) PRESUMED    a. 04/2010 NSTEMI->med rx;  b. 09/2012 inf stemi->med rx.  . Arthritis   . Anxiety   . Hydronephrosis     Chronic  . Chronic back pain   . Stroke   . Herpes zoster 03/01/2012   Past Surgical History  Procedure Laterality Date  . Esophagogastroduodenoscopy  04/19/2011    Procedure: ESOPHAGOGASTRODUODENOSCOPY (EGD);  Surgeon: Erick Blinks, MD;  Location: [facility] ENDOSCOPY;  Service: Gastroenterology;  Laterality: N/A;   Family History  Problem Relation Age of Onset  . CAD Neg Hx    History  Substance Use Topics  . Smoking status: Never Smoker   . Smokeless tobacco: Never Used  . Alcohol Use: No   OB History   Grav Para Term Preterm Abortions TAB SAB Ect Mult Living                 Review of Systems  Unable to perform ROS: Acuity of condition  Respiratory: Positive for shortness of breath.       Allergies  Review of patient's allergies indicates no known allergies.  Home Medications   Prior to Admission medications   Medication Sig Start Date End Date Taking? Authorizing Provider  aspirin EC 81 MG tablet Take 81 mg by mouth daily.   Yes Historical Provider, MD  diclofenac sodium (VOLTAREN) 1 % GEL Apply 2 g topically 4 (four) times daily. 10/03/13  Yes Stephanie [address], MD  hydrALAZINE (APRESOLINE) 50 MG tablet Take 50 mg by mouth 4 (four) times daily.  Yes Historical Provider, MD  isosorbide mononitrate (IMDUR) 30 MG 24 hr tablet Take 15 mg by mouth daily.   Yes Historical Provider, MD  megestrol (MEGACE) 40 MG tablet Take 160 mg by mouth daily.   Yes Historical Provider, MD  Melatonin 5 MG TABS Take 10 mg by mouth at bedtime.    Yes Historical Provider, MD  Meth-Hyo-M Bl-Na Phos-Ph Sal (URIBEL) 118 MG CAPS Take 1 capsule by mouth 4 (four) times daily.   Yes Historical Provider, MD  Multiple Vitamin (MULTIVITAMIN WITH MINERALS) TABS tablet Take 1 tablet by mouth daily.   Yes Historical Provider, MD  nitroGLYCERIN (NITROSTAT) 0.4 MG SL tablet  Place 1 tablet (0.4 mg total) under the tongue every 5 (five) minutes as needed for chest pain. 10/04/12  Yes Ok Anis, NP  pantoprazole (PROTONIX) 40 MG tablet Take 40 mg by mouth daily.   Yes Historical Provider, MD  pindolol (VISKEN) 5 MG tablet Take 2.5-5 mg by mouth See admin instructions. Take 1 tablet in the morning and 0.5 tablet in the evening.   Yes Historical Provider, MD  torsemide (DEMADEX) 20 MG tablet Take 10 mg by mouth 3 (three) times a week. Take on Tuesday, Thursday and Saturday at 6pm.   Yes Historical Provider, MD   BP 127/94  Pulse 91  Temp(Src) 99.4 F (37.4 C) (Rectal)  Resp 15  SpO2 97% Physical Exam  Nursing note and vitals reviewed. Constitutional: She appears distressed.  HENT:  Head: Normocephalic and atraumatic.  Mouth/Throat: Oropharynx is clear and moist. No oropharyngeal exudate.  Eyes: Conjunctivae and EOM are normal. Pupils are equal, round, and reactive to light. Right eye exhibits no discharge. Left eye exhibits no discharge. No scleral icterus.  Neck: Normal range of motion. Neck supple. No JVD present. No thyromegaly present.  Cardiovascular: Regular rhythm, normal heart sounds and intact distal pulses.  Exam reveals no gallop and no friction rub.   No murmur heard. Regular tachycardia, JVD present, minimal peripheral edema  Pulmonary/Chest: She is in respiratory distress. She has no wheezes. She has rales.  Scattered rales at the bases, increased work of breathing, accessory muscle use present  Abdominal: Soft. Bowel sounds are normal. She exhibits no distension and no mass. There is no tenderness.  Genitourinary:  Foley catheter in place, urine is blue in color, family member states it is medication related and chronic  Musculoskeletal: Normal range of motion. She exhibits no edema and no tenderness.  Lymphadenopathy:    She has no cervical adenopathy.  Neurological: She is alert. Coordination normal.  Skin: Skin is warm and dry. No  rash noted. She is not diaphoretic. No erythema.  Psychiatric: She has a normal mood and affect. Her behavior is normal.    ED Course  Procedures (including critical care time) Labs Review Labs Reviewed  BASIC METABOLIC PANEL - Abnormal; Notable for the following:    Potassium 5.8 (*)    Glucose, Bld 122 (*)    BUN 32 (*)    Creatinine, Ser 1.75 (*)    GFR calc non Af Amer 24 (*)    GFR calc Af Amer 27 (*)    All other components within normal limits  CBC WITH DIFFERENTIAL - Abnormal; Notable for the following:    Hemoglobin 11.9 (*)    All other components within normal limits  PRO B NATRIURETIC PEPTIDE - Abnormal; Notable for the following:    Pro B Natriuretic peptide (BNP) 5631.0 (*)    All other components  within normal limits  I-STAT CHEM 8, ED - Abnormal; Notable for the following:    Potassium 5.5 (*)    Chloride 115 (*)    BUN 34 (*)    Creatinine, Ser 2.10 (*)    Glucose, Bld 118 (*)    All other components within normal limits  I-STAT CG4 LACTIC ACID, ED - Abnormal; Notable for the following:    Lactic Acid, Venous 2.30 (*)    All other components within normal limits  I-STAT ARTERIAL BLOOD GAS, ED - Abnormal; Notable for the following:    pH, Arterial 7.332 (*)    pCO2 arterial 47.9 (*)    pO2, Arterial 212.0 (*)    Bicarbonate 25.4 (*)    All other components within normal limits  APTT  PROTIME-INR  BLOOD GAS, ARTERIAL  I-STAT TROPOININ, ED    Imaging Review Dg Chest Port 1 View  11/10/2013   CLINICAL DATA:  Chest Pain  EXAM: PORTABLE CHEST - 1 VIEW  COMPARISON:  10/03/2012  FINDINGS: Stable cardiomegaly. Improved aeration of the left lung base with some residual linear scarring or subsegmental atelectasis. Mild diffuse interstitial edema and central pulmonary vascular congestion. No definite effusion.  Atheromatous aortic arch. Visualized skeletal structures are unremarkable.  IMPRESSION: 1. Stable cardiomegaly and bilateral interstitial edema. 2.  Resolution of previously noted left lower lung consolidation.   Electronically Signed   By: Oley Balm M.D.   On: 11/10/2013 02:01     EKG Interpretation   Date/Time:  Monday November 10 2013 01:49:23 EDT Ventricular Rate:  95 PR Interval:  152 QRS Duration: 92 QT Interval:  450 QTC Calculation: 566 R Axis:   73 Text Interpretation:  Sinus rhythm LVH with secondary repolarization  abnormality Anterior Q waves, possibly due to LVH Minimal ST elevation,  inferior leads Prolonged QT interval Artifact in lead(s) I II III aVR aVL  aVF V1 V2 V3 V6 since last tracing no significant change Abnormal ekg  Confirmed by Shyam Dawson  MD, Geanine Vandekamp ([id number]) on 11/10/2013 1:58:34 AM      MDM   Final diagnoses:  Respiratory distress  Acute on chronic systolic congestive heart failure  Hyperkalemia  Renal failure    The patient is in acute respiratory distress. Her EKG is grossly abnormal with tachycardia, there is baseline wander which prevents evaluation of elevation  There is significant concern for ischemic process, CHF, x-ray and labs ordered, IV access obtained, cardiac monitoring, BiPAP required. The patient is critically ill.  The patient remains critically ill with the need for BiPAP. It has significantly improved her symptoms and she is no longer hypoxic, she is not tachycardic, she is resting after a small dose of morphine and her blood pressure is in a normal range. Her laboratory workup shows that she has a slight acidosis, slight hyperkalemia, worsening renal insufficiency and renal failure. She has a normal troponin, BNP of over 5000 and a normal blood count. I suspect this is related to congestive heart failure, Lasix will be ordered, patient will be admitted, will consult internal medicine teaching service.  The patient has stabilized nicely on positive airway pressure, I have discussed the care with the family practice resident who will come to admit the patient to the step down  unit.  CRITICAL CARE Performed by: Vida Roller Total critical care time: 35 Critical care time was exclusive of separately billable procedures and treating other patients. Critical care was necessary to treat or prevent imminent or life-threatening deterioration. Critical care was time  spent personally by me on the following activities: development of treatment plan with patient and/or surrogate as well as nursing, discussions with consultants, evaluation of patient's response to treatment, examination of patient, obtaining history from patient or surrogate, ordering and performing treatments and interventions, ordering and review of laboratory studies, ordering and review of radiographic studies, pulse oximetry and re-evaluation of patient's condition.   Vida Roller, MD 11/10/13 [phone]

## 2013-11-10 NOTE — H&P (Signed)
I have seen and examined this patient. I have discussed with Dr Jarvis Newcomer and Dr Nadine Counts.  I agree with their findings and plans as documented in their admission note.   Acute Issue 1. Acute on Chronic heart Failure with preserved ejection fraction - Improved with IV Lasix 40 mg x 1 and Bipap - Second admission for cardiac condition in a month.  Family chose Do Not Resuscitate status for patient during the 10/04/13 hospitalization. Cardiology's management plan is conservative therapy.  Family was not interested in palliative care consultation during last hospitalization.  2. End-of-Life Discussion - I spoke with dgt at bedside.   - Daughter says that her mother had requested to keep her alive as long as possible.  - The daughter had the belief that a patient must have "DNR" status to enroll in Hospice. My understanding of CMS regulation of hospice benefit is that "DNR" status is not a necessary condition of enrollment, though hospice agencies may decline to provide services to patients without "DNR" status.  If family remains resistant to Hospice enrollment with full code status, then completion of a MOST form with the family may be helpful in defining the extent of non-comfort care interventions desired by family.   I would recommend stopping the Megace as it is associated with volume overload through its progestational effect.   Consult with cardiology for home loop diuretic dose and schedule.    Consider increase in long-acting nitrate with goal to decrease preload and PCWP.  If family is willing to use a comfort measure for control of her her  Dyspnea-related symptoms, consider use of opiate like oxycodone as needed dyspnea.  I would avoid Morphine given her CKD and risk of accumulating toxic morphine metabolites.

## 2013-11-10 NOTE — Progress Notes (Signed)
Patient NO:MVEH Kercheval      DOB: 04-May-1916      MCN:[id number]  Mrs. Yvette [name] and her family are well known to the Palliative Medicine Team.  Met with Patient's daughter Yvette [name].  Patient continues to tell family "I want to live".  She has done well per Yvette [name] since our last visit with them almost 1 yr ago.  Yvette [name] states this visit has been the first in many months.  Current goals are treat the treatable, full code.  Yvette [name] states she will talk with her brothers about officially meeting to discuss further options.     I gave Yvette [name] our card to call after she talks with her brothers.   Yvette Burdine L. Lovena Le, MD MBA The Palliative Medicine Team at [facility] Phone: [phone] Pager: [phone] ( Use team phone after hours)

## 2013-11-10 NOTE — ED Notes (Signed)
Pt from home. Started having SOB and CP around 11pm. EMS called. Pt denied CP upon arrival, but was agitated and combative. O2 sats found to be in the 60's on room air. Pt placed on CPAP and o2 sats 98%. Pt ESRD with indwelling catheter. Has a history of CHF. Pt received  albuterol, 0.5 of atrovent,  solumedrol per EMS.

## 2013-11-10 NOTE — ED Notes (Signed)
Pt tolerating Bipap, RT at [facility] obtaining ABG, zofran and morphine given. Pt remains alert, NAD, calm, interactive, speaking with daughter at [facility], VSS, improved.

## 2013-11-10 NOTE — ED Notes (Signed)
Attempted report 

## 2013-11-10 NOTE — Progress Notes (Signed)
PT Cancellation Note  Patient Details Name: Yvette [name] MRN: [id number] DOB: 1916/10/03   Cancelled Treatment:    Reason Eval/Treat Not Completed: Patient not medically ready.  Pt currently on 12hr bedrest until ~5:30pm.  Will f/u tomorrow.     Nicolette Gieske, Alison Murray 11/10/2013, 7:17 AM

## 2013-11-10 NOTE — Progress Notes (Signed)
Pt transported to CCU without complication.

## 2013-11-10 NOTE — Consult Note (Addendum)
CARDIOLOGY CONSULT NOTE   Patient ID: Yvette [name] MRN: [id number], DOB/AGE: 1916/11/25   Admit date: 11/10/2013 Date of Consult: 11/10/2013  Primary Physician: Maryjean Ka, MD Primary Cardiologist: Dr Antoine Poche  Reason for consult:  Respiratory failure   Problem List  Past Medical History  Diagnosis Date  . Paroxysmal atrial flutter [facility] admission 2/5-04/20/2010    High fall risk-not a Coumadin candidate  . Hypertension   . Chronic diastolic heart failure     a. Echo February 2012: EF 55%; moderate LVH; mild AI; mild MR; PASP 39  . Sick sinus syndrome     a. Limits use of beta blocker-pindolol tolerated  . CAD (coronary artery disease) PRESUMED    a. 04/2010 NSTEMI->med rx;  b. 09/2012 inf stemi->med rx.  . Arthritis   . Anxiety   . Hydronephrosis     Chronic  . Chronic back pain   . Stroke   . Herpes zoster 03/01/2012  . Myocardial infarction   . CHF (congestive heart failure)   . Chronic indwelling Foley catheter 11/10/2013    Past Surgical History  Procedure Laterality Date  . Esophagogastroduodenoscopy  04/19/2011    Procedure: ESOPHAGOGASTRODUODENOSCOPY (EGD);  Surgeon: Erick Blinks, MD;  Location: [facility] ENDOSCOPY;  Service: Gastroenterology;  Laterality: N/A;     Allergies  No Known Allergies  HPI   80 year old female with a history of CHF with pEF, hypertension and sick sinus syndrome, ESRD on HD. She presents to the hospital with severe respiratory distress which according to the family member started at 11 pm yesterday. It was acute in onset, persistent and severe, found to have oxygen saturations of 60% on room air. Required BiPAP in route for adequate oxygenation, o2 sats 98% but the patient remained uncomfortable appearing, very tachycardic and in apparent respiratory distress. She does not speaking much, her family member gives the majority of the history. The family member states that she has not had any swelling of her legs and has not gained any weight  according to her daily weights. She does see Dr Antoine Poche in our clinic, the last visit on 07/29/13 when she denied any symptoms other Marshelle insomnia. She was well compensated and euvolemic at that time. She was in SR at that with PRN pindolol for paroxysmal a-flutter. She is not a Coumadin candidate due to frequent fall risk. She was given a nitroglycerin and an aspirin prior to arrival because she was complaining of chest pain with her shortness of breath. The patient is a full code. The patient has known CKD stage 4 and a permanent catheter, but the daughter is refusing HD. The patient has been on chronic torsemide 10 mg po daily and is producing good amount of urine.    Inpatient Medications  . aspirin EC  81 mg Oral Daily  . chlorhexidine  15 mL Mouth/Throat BID  . enoxaparin (LOVENOX) injection  20 mg Subcutaneous Q24H  . hydrALAZINE  50 mg Oral QID  . isosorbide mononitrate  15 mg Oral Daily  . multivitamin with minerals  1 tablet Oral Daily  . pantoprazole  40 mg Oral Daily  . pindolol  2.5 mg Oral QHS  . pindolol  5 mg Oral Daily  . sodium chloride  3 mL Intravenous Q12H  . sodium chloride  3 mL Intravenous Q12H  . torsemide  10 mg Oral Once per day on Mon Wed Fri    Family History Family History  Problem Relation Age of Onset  . CAD Neg Hx  Social History History   Social History  . Marital Status: Widowed    Spouse Name: N/A    Number of Children: N/A  . Years of Education: N/A   Occupational History  . Retired    Social History Main Topics  . Smoking status: Never Smoker   . Smokeless tobacco: Never Used  . Alcohol Use: No  . Drug Use: No  . Sexual Activity: No   Other Topics Concern  . Not on file   Social History Narrative   Pt lives with family. She has 24 hour care.    Review of Systems  General:  No chills, fever, night sweats or weight changes.  Cardiovascular:  No chest pain, dyspnea on exertion, edema, orthopnea, palpitations, paroxysmal  nocturnal dyspnea. Dermatological: No rash, lesions/masses Respiratory: No cough, dyspnea Urologic: No hematuria, dysuria Abdominal:   No nausea, vomiting, diarrhea, bright red blood per rectum, melena, or hematemesis Neurologic:  No visual changes, wkns, changes in mental status. All other systems reviewed and are otherwise negative except as noted above.  Physical Exam  Blood pressure 167/94, pulse 98, temperature 98.3 F (36.8 C), temperature source Axillary, resp. rate 22, height  (1.473 m), weight 97 lb 14.2 oz (44.4 kg), SpO2 91.00%.  General: Pleasant, NAD, sleeping with nasal canula  Psych: Normal affect. Neuro: Alert and oriented X 3. Moves all extremities spontaneously. HEENT: Normal  Neck: Supple without bruits or JVD. Lungs:  Resp regular and unlabored, crackles at the bases Heart: RRR no s3, s4, or murmurs. Abdomen: Soft, non-tender, non-distended, BS + x 4.  Extremities: No clubbing, cyanosis or edema. DP/PT/Radials 2+ and equal bilaterally.  Labs  Recent Labs  11/10/13 0641  TROPONINI <0.30   Lab Results  Component Value Date   WBC 7.9 11/10/2013   HGB 11.4* 11/10/2013   HCT 35.5* 11/10/2013   MCV 95.7 11/10/2013   PLT 234 11/10/2013    Recent Labs Lab 11/10/13 0641  NA 142  K 5.6*  CL 106  CO2 23  BUN 33*  CREATININE 1.76*  CALCIUM 8.8  PROT 8.2  BILITOT 0.3  ALKPHOS 52  ALT 15  AST 21  GLUCOSE 135*   Lab Results  Component Value Date   CHOL 153 09/25/2012   HDL 54 09/25/2012   LDLCALC 87 09/25/2012   TRIG 60 09/25/2012   Radiology/Studies  Dg Chest Port 1 View  11/10/2013   CLINICAL DATA:  Chest Pain  EXAM: PORTABLE CHEST - 1 VIEW  COMPARISON:  10/03/2012  FINDINGS: Stable cardiomegaly. Improved aeration of the left lung base with some residual linear scarring or subsegmental atelectasis. Mild diffuse interstitial edema and central pulmonary vascular congestion. No definite effusion.  Atheromatous aortic arch. Visualized skeletal  structures are unremarkable.  IMPRESSION: 1. Stable cardiomegaly and bilateral interstitial edema. 2. Resolution of previously noted left lower lung consolidation.     Echocardiogram - none in our system  ECG: Sinus tachycardia, LVH, negative T waves in the lateral leads, more profound [place] on ECG on 7/25, STE in lead III, unchanged from the prior ECG    ASSESSMENT AND PLAN  80 year old female   1. Acute on chronic CHF with pEF - the patient is fluid overloaded, there is no echocardiogram in Epic, the results are pending, we will follow for filling pressures and RVSP.   - we will start with Lasix 40 mg iv BID x 2 doses and reassess tomorrow with close monitoring of her Crea. If necessary we can  always use her permcath for temporary HD in order to remove extra fluids.  2. HTN - this might be the reason for her CHF decompensation, the daughter states that recently her BP has been elevated in the evening up to 190 mmHg. For now I will increase Imdur to 30 mg po daily, I would also divide her meds so that some antihypertensives are taken in the am and some in the PM.  3. Paroxysmal atrial flutter, currently in SR, continue pindolol  Signed, Lars Masson, MD, [facility] 11/10/2013, 10:38 AM

## 2013-11-11 DIAGNOSIS — I5033 Acute on chronic diastolic (congestive) heart failure: Secondary | ICD-10-CM

## 2013-11-11 DIAGNOSIS — N179 Acute kidney failure, unspecified: Secondary | ICD-10-CM

## 2013-11-11 DIAGNOSIS — I2510 Atherosclerotic heart disease of native coronary artery without angina pectoris: Secondary | ICD-10-CM

## 2013-11-11 DIAGNOSIS — I209 Angina pectoris, unspecified: Secondary | ICD-10-CM

## 2013-11-11 DIAGNOSIS — N183 Chronic kidney disease, stage 3 unspecified: Secondary | ICD-10-CM

## 2013-11-11 DIAGNOSIS — E43 Unspecified severe protein-calorie malnutrition: Secondary | ICD-10-CM | POA: Insufficient documentation

## 2013-11-11 HISTORY — DX: Unspecified severe protein-calorie malnutrition: E43

## 2013-11-11 LAB — BASIC METABOLIC PANEL
ANION GAP: 18 — AB (ref 5–15)
BUN: 55 mg/dL — AB (ref 6–23)
CALCIUM: 8.9 mg/dL (ref 8.4–10.5)
CO2: 20 mEq/L (ref 19–32)
CREATININE: 2.19 mg/dL — AB (ref 0.50–1.10)
Chloride: 103 mEq/L (ref 96–112)
GFR calc non Af Amer: 18 mL/min — ABNORMAL LOW (ref >90)
GFR, EST AFRICAN AMERICAN: 21 mL/min — AB (ref >90)
Glucose, Bld: 131 mg/dL — ABNORMAL HIGH (ref 70–99)
Potassium: 5.3 mEq/L (ref 3.7–5.3)
Sodium: 141 mEq/L (ref 137–147)

## 2013-11-11 MED ORDER — TORSEMIDE 10 MG PO TABS: 10.0000 mg | ORAL_TABLET | ORAL | Status: DC

## 2013-11-11 MED ORDER — TORSEMIDE 20 MG PO TABS
20.0000 mg | ORAL_TABLET | Freq: Every day | ORAL | Status: DC
  Administered 2013-11-11: 20 mg via ORAL
  Filled 2013-11-11: qty 1

## 2013-11-11 MED ORDER — TORSEMIDE 20 MG PO TABS
20.0000 mg | ORAL_TABLET | Freq: Every day | ORAL | Status: DC
  Administered 2013-11-12: 20 mg via ORAL
  Filled 2013-11-11 (×2): qty 1

## 2013-11-11 MED ORDER — ENSURE COMPLETE PO LIQD
237.0000 mL | Freq: Two times a day (BID) | ORAL | Status: DC
  Administered 2013-11-11 – 2013-11-12 (×2): 237 mL via ORAL

## 2013-11-11 NOTE — Clinical Documentation Improvement (Signed)
Please clarify if you agree hyperkalemia per ED note 11/10/13 or other diagnoses and document in pn or d/c summary    Possible Clinical Conditions?                                   Other Condition___________________                 Cannot Clinically Determine_________   Supporting Information: Risk Factors:  HTN, Resp failure, SSS, JVD to jawline  slight hyperkalemia, worsening renal insufficiency and renal failure. Per ED note 11/10/13  Diagnostics Potassium=508, 505, 506   Treatment:  Monitoring labs  Thank You, Enis Slipper ,RN Clinical Documentation Specialist:  [phone]  [facility]- Health Information Management

## 2013-11-11 NOTE — Discharge Summary (Signed)
Family Medicine Teaching [facility] Discharge Summary  Patient name: Yvette [name] Medical record number: [id number] Date of birth: June 09, 1916 Age: 80 y.o. Gender: female Date of Admission: 11/10/2013  Date of Discharge: 11/12/13 Admitting Physician: Leighton Roach McDiarmid, MD  Primary Care Provider: Maryjean Ka, MD Consultants: Cardiology, Palliative Care  Indication for Hospitalization: Acute on Chronic Diastolic Congestive Heart Failure  Discharge Diagnoses/Problem List:  Acute respiratory Failure secondary to Acute on Chronic CHF Hyperkalemia HTN Paroxysmal Atrial Flutter Unstable Angina Sick Sinus Syndrome AKI on Stage III CKD Urinary Retention  Disposition: Discharge home.  Discharge Condition: Stable  Brief Hospital Course:  1. Acute respiratory Failure secondary to Acute on Chronic CHF -Cardiology consulted -Echocardiogram- EF=65-70%, Grade I diastolic dysfunction of left ventricle, severe LVH with small LV cavity, moderate aortic regurgitation, mild mitral stenosis and mitral regurgitation, normal RVSP, and abnormal relaxation with elevated filling pressures -EKG- ST elevation in inferior leads and LVH, unchanged since 09/2013 -Given two doses of Lasix  then transitioned to Torsemide  PO. -Original on Bipap, but then transitioned to 4L [place] and weaned to room air -Troponins <0.3 x3 -BNP- 7907 -Patient's family did not desire Palliative Care meeting during hospitalization, but resources given so family can contact Palliative Care if desired in future  2. Hyperkalemia-  -K 5.8 initially, trended down to 4.1 at discharge  3. HTN -Range during hospitalization:  105-188 / 45-111 -Imdur increased to  PO -Hydralazine  four times daily   4. Paroxysmal Atrial Flutter -Monitored by telemetry, which showed NSR throughout hospitalization -Treated with Pindolol  and 2.5mg  at bed  5. Unstable Angina -Stable throughout hospitalization -Treated with Imdur,  Pindolol, and Aspirin   6. Sick Sinus Syndrome -HR range during hospitalization- 86-111 -Closely monitored use of Pindolol and HR  7. AKI on Stage III CKD -Cr-  1.75,  2.10, 1.76, 2.19, 1.88  -Baseline Creatinine noted to be 1.2-1.7 -BUN- 32, 34, 33, 55, 59  8. Urinary Retention -Uribel held throughout hospitalization -Foley in place  Issues for Follow Up:  -Increased dose of Torsemide to  PO.  Adjust dose based on clinical exam. -Increased dose of Imdur to .  Monitor BP as outpatient. -Follow-up renal function with BMP in office -Consider referral to Geriatric Clinic -Follows up with Cardiology on 12/17/13  Significant Procedures: None  Significant Labs and Imaging:   Recent Labs Lab 11/10/13 0156 11/10/13 0203 11/10/13 0641  WBC 10.0  --  7.9  HGB 11.9* 13.9 11.4*  HCT 37.1 41.0 35.5*  PLT 283  --  234    Recent Labs Lab 11/10/13 0156 11/10/13 0203 11/10/13 0641 11/11/13 0947 11/12/13 0732  NA 141 141 142 141 145  K 5.8* 5.5* 5.6* 5.3 4.1  CL 106 115* 106 103 102  CO2 21  --  GLUCOSE 122* 118* 135* 131* 148*  BUN 32* 34* 33* 55* 59*  CREATININE 1.75* 2.10* 1.76* 2.19* 1.88*  CALCIUM 8.9  --  8.8 8.9 9.3  MG  --   --  2.5  --   --   PHOS  --   --  5.8*  --   --   ALKPHOS  --   --  52  --   --   AST  --   --  21  --   --   ALT  --   --  15  --   --   ALBUMIN  --   --  3.3*  --   --    -  Troponins <0.3 x3 -BNP- 7907 -EKG- ST elevation in inferior leads and LVH, unchanged since 09/2013  Results/Tests Pending at Time of Discharge: None  Discharge Medications:    Medication List         aspirin EC 81 MG tablet  Take 81 mg by mouth daily.     diclofenac sodium 1 % Gel  Commonly known as:  VOLTAREN  Apply 2 g topically 4 (four) times daily.     hydrALAZINE 50 MG tablet  Commonly known as:  APRESOLINE  Take 50 mg by mouth 4 (four) times daily.     isosorbide mononitrate 30 MG 24 hr tablet  Commonly known as:  IMDUR  Take  15 mg by mouth daily.     megestrol 40 MG tablet  Commonly known as:  MEGACE  Take 160 mg by mouth daily.     Melatonin 5 MG Tabs  Take 10 mg by mouth at bedtime.     multivitamin with minerals Tabs tablet  Take 1 tablet by mouth daily.     nitroGLYCERIN 0.4 MG SL tablet  Commonly known as:  NITROSTAT  Place 1 tablet (0.4 mg total) under the tongue every 5 (five) minutes as needed for chest pain.     pantoprazole 40 MG tablet  Commonly known as:  PROTONIX  Take 40 mg by mouth daily.     pindolol 5 MG tablet  Commonly known as:  VISKEN  Take 2.5-5 mg by mouth See admin instructions. Take 1 tablet in the morning and 0.5 tablet in the evening.     torsemide 20 MG tablet  Commonly known as:  DEMADEX  Take 1 tablet (20 mg total) by mouth daily. Take on Tuesday, Thursday and Saturday at 6pm.     URIBEL 118 MG Caps  Take 1 capsule by mouth 4 (four) times daily.        Discharge Instructions: Please refer to Patient Instructions section of EMR for full details.  Patient was counseled important signs and symptoms that should prompt return to medical care, changes in medications, dietary instructions, activity restrictions, and follow up appointments.   Follow-Up Appointments: Follow-up Information   Follow up with Rollene Rotunda, MD On 12/17/2013. (2:45 PM)    Specialty:  Cardiology   Contact information:   [address] [place] [address] [phone]       Follow up with Saralyn Pilar, DO On 11/18/2013. ( :15 for Hospital Follow-Up)    Specialty:  Osteopathic Medicine   Contact information:   [address] [place] [address] [phone]       Araceli Bouche, DO 11/12/2013, 3:32 PM PGY-1, [facility] Family Medicine

## 2013-11-11 NOTE — Progress Notes (Signed)
INITIAL NUTRITION ASSESSMENT  DOCUMENTATION CODES Per approved criteria  -Severe malnutrition in the context of chronic illness  Pt meets criteria for severe MALNUTRITION in the context of chronic illness as evidenced by 6% weight loss in 1 month, severe fat and muscle wasting and reported intake <75% of estimated needs for >1 month.  INTERVENTION: Ensure Complete po BID, each supplement provides 350 kcal and 13 grams of protein  NUTRITION DIAGNOSIS: Inadequate oral intake related to chronic illness as evidenced by wt loss and reported intake less Zasha estimated needs.   Goal: Pt to meet >/= 90% of their estimated nutrition needs   Monitor:  Weight trend, po intake, acceptance of supplements, labs  Reason for Assessment: Low Braden  80 y.o. female  Admitting Dx: <principal problem not specified>  ASSESSMENT: 80 y.o. female presenting with SOB . PMH is significant for CHF, CKD 3, paroxysmal a-flutter, HTN, CAD, STEMI, unstable angina   - Pt followed by palliative care. Spoke with pt's daughter. She says that pt has only been eating ~1/3 of her meals in the hospital. Pt drinks Ensure supplements at home (1/day). Her usual body weight is ~103 lbs.   Nutrition Focused Physical Exam:  Subcutaneous Fat:  Orbital Region: moderate to severe wasting Upper Arm Region: moderate to severe wasting Thoracic and Lumbar Region: n/a  Muscle:  Temple Region: moderate to severe wasting Clavicle Bone Region: moderate to severe wasting Clavicle and Acromion Bone Region: moderate to severe wasting Scapular Bone Region: n/a Dorsal Hand: moderate to severe wasting Patellar Region: moderate wasting Anterior Thigh Region: moderate wasting Posterior Calf Region: moderate wasting  Edema: none  Height: Ht Readings from Last 1 Encounters:  11/10/13  (1.473 m)    Weight: Wt Readings from Last 1 Encounters:  11/11/13 97 lb 3.6 oz (44.1 kg)    Ideal Body Weight: 40.9 kg  % Ideal  Body Weight: 108%  Wt Readings from Last 10 Encounters:  11/11/13 97 lb 3.6 oz (44.1 kg)  07/29/13 95 lb (43.092 kg)  10/04/12 100 lb 8.5 oz (45.6 kg)  10/04/12 100 lb 8.5 oz (45.6 kg)  09/27/12 102 lb 8.2 oz (46.5 kg)  09/02/12 103 lb (46.72 kg)  08/16/12 91 lb 7.9 oz (41.5 kg)  07/26/12 104 lb 15 oz (47.6 kg)  07/25/12 107 lb 12.9 oz (48.9 kg)  07/18/12 110 lb (49.896 kg)    Usual Body Weight: 103 lbs  % Usual Body Weight: 94%  BMI:  Body mass index is 20.33 kg/(m^2).  Estimated Nutritional Needs: Kcal: 1300-1500 Protein: 65-75 g Fluid: 1.5 L/day  Skin: WNL  Diet Order: Cardiac  EDUCATION NEEDS: -Education needs addressed   Intake/Output Summary (Last 24 hours) at 11/11/13 1128 Last data filed at 11/11/13 0500  Gross per 24 hour  Intake    240 ml  Output   2100 ml  Net  -1860 ml    Last BM: prior to admission   Labs:   Recent Labs Lab 11/10/13 0156 11/10/13 0203 11/10/13 0641 11/11/13 0947  NA 141 141 142 141  K 5.8* 5.5* 5.6* 5.3  CL 106 115* 106 103  CO2 21  --  23 20  BUN 32* 34* 33* 55*  CREATININE 1.75* 2.10* 1.76* 2.19*  CALCIUM 8.9  --  8.8 8.9  MG  --   --  2.5  --   PHOS  --   --  5.8*  --   GLUCOSE 122* 118* 135* 131*    CBG (last 3)  No results found for this basename: GLUCAP,  in the last 72 hours  Scheduled Meds: . aspirin EC  81 mg Oral Daily  . chlorhexidine  15 mL Mouth/Throat BID  . enoxaparin (LOVENOX) injection  20 mg Subcutaneous Q24H  . hydrALAZINE  50 mg Oral QID  . isosorbide mononitrate  30 mg Oral Daily  . multivitamin with minerals  1 tablet Oral Daily  . pantoprazole  40 mg Oral Daily  . pindolol  2.5 mg Oral QHS  . pindolol  5 mg Oral Daily  . sodium chloride  3 mL Intravenous Q12H  . sodium chloride  3 mL Intravenous Q12H  . torsemide  20 mg Oral Daily    Continuous Infusions:   Past Medical History  Diagnosis Date  . Paroxysmal atrial flutter [facility] admission 2/5-04/20/2010    High fall risk-not a  Coumadin candidate  . Hypertension   . Chronic diastolic heart failure     a. Echo February 2012: EF 55%; moderate LVH; mild AI; mild MR; PASP 39  . Sick sinus syndrome     a. Limits use of beta blocker-pindolol tolerated  . CAD (coronary artery disease) PRESUMED    a. 04/2010 NSTEMI->med rx;  b. 09/2012 inf stemi->med rx.  . Arthritis   . Anxiety   . Hydronephrosis     Chronic  . Chronic back pain   . Stroke   . Herpes zoster 03/01/2012  . Myocardial infarction   . CHF (congestive heart failure)   . Chronic indwelling Foley catheter 11/10/2013    Past Surgical History  Procedure Laterality Date  . Esophagogastroduodenoscopy  04/19/2011    Procedure: ESOPHAGOGASTRODUODENOSCOPY (EGD);  Surgeon: Erick Blinks, MD;  Location: [facility] ENDOSCOPY;  Service: Gastroenterology;  Laterality: N/A;    Ebbie Latus RD, LDN

## 2013-11-11 NOTE — Progress Notes (Signed)
Family Medicine Teaching Service Daily Progress Note Intern Pager: [phone]  Patient name: Yvette [name] Medical record number: [id number] Date of birth: 09-22-1916 Age: 80 y.o. Gender: female  Primary Care Provider: Maryjean Ka, MD Consultants: Cardiology (Dr. Delton See), Palliative Care (Dr. Ladona Ridgel) Code Status: Full  Pt Overview and Major Events to Date:  11/10/13:  Admission to Cardiac SDU; Cardiology and Palliative Care Consulted; Echocardiogram; transitioned from Bipap to [place]  Assessment and Plan: Yvette [name] is a 80year old female with past medical history of CHF, CKD stage III, Paroxysmal atrial flutter, HTN, CAD, STEMI, and Unstable Angina, presenting with shortness of breath.  #Acute Respiratory Failure, most likely secondary to Acute on Chronic Diastolic CHF -DDx: CHF exacerbation vs. MI vs. Pneumonia vs. PE  -CHF- CXR shows stable cardiomegaly and bilateral interstitial edema, edema, JVD, BNP elevation  -MI- history of NSTEMI and STEMI in 2012, stable EKG with inferior ST elevation and lateral T wave changes, troponins stable  -Pneumonia- improvement since CXR on 7/24, no acute findings  -PE- acute hypoxemia with prolonged immobility -Cardiology Consult Recommendation:   -Echocardiogram   -04/2010- EF 55% with moderate LVH   -11/10/13- EF 65-70%     -Grade I diastolic dysfunction of left ventricle    -Severe LVH with small LV cavity    -Moderate aortic regurgitation    -Mild mitral stenosis and mitral regurgitation    -Normal RVSP    -Abnormal relaxation with elevated filling pressures.  -Lasix  IV BID for two doses  -Monitor Creatinine  -Consider using permcath for temporary hemodialysis to remove fluids -Transitioned from Bipap to 4L [place] with humidity on 11/10/13  -Attempt to wean today -Labs:  -Troponins: <0.30 x3  -BNP: 7907  -TSH: 1.69  -BMP pending -Medications:  -Torsemide  PO -PRN Medications:  -Acetaminophen  tablet/suppository q6hr PRN  pain/fever  -Nitroglycerin 0.4mg  q79min PRN Chest Pain  -Follow-up PT/OT recommendations  #HTN -Cardiology Consult Recommendation:  -Increase Imdur to  PO -Continue Hydralazine  four times daily   #Paroxysmal Atrial Flutter -Continue Pindolol  and 2.5mg  at bed -Monitor on telemetry  #Unstable Angina -Continue Imdure, Pindolol, and Aspirin   #Sick Sinus Syndrome -Use Pindolol (tx of Paroxysmal atrial flutter and unstable angina) with caution. -Monitor on telemetry  #AKI on Stage III CKD -Cr=2.19  -Baseline Cr= 1.2-1.7 -BUN=55 -Consider Kayexelate if potassium doesn't trend down -Monitor CMP with Magnesium and Phosphorus  #Urinary Retention -Foley in place -Hold Uribel--has turned urine blue  FEN/GI: NPO, saline lock IV, Protonix , heart diet Social:  Case Management consult for heart failure health screen and PT/OT  PPx: Renally Dosed Lovenox   Disposition:  -Admitted to Cardiac SDU under Dr. McDiarmid.  Subjective:  Yvette [name] is doing well today and is breathing much better Yvette [name] yesterday according to her daughter.  Daughter states that she has not complained of difficulty breathing all night.  At baseline, patient is talkative with no dysfunction in memory.  Daughter states mother has been more confused yesterday and today and has been more sleepy.  No acute complaints overnight.  Objective: Temp:  [97.6 F (36.4 C)-99.3 F (37.4 C)] 98.8 F (37.1 C) (09/01 1124) Pulse Rate:  [88-101] 91 (09/01 1300) Resp:  [13-24] 22 (09/01 1300) BP: (105-175)/(45-88) 137/69 mmHg (09/01 1300) SpO2:  [92 %-100 %] 97 % (09/01 1300) Weight:  [97 lb 3.6 oz (44.1 kg)] 97 lb 3.6 oz (44.1 kg) (09/01 0500) Physical Exam: General: 80 yo female resting comfortably and in no apparent distress.  Did  not open eyes throughout exam. Cardiovascular: S1 and S2 noted. Regular rate and rhythm.  No murmurs noted. Respiratory: Clear to auscultation bilaterally.  No wheezing.  No increased work of breathing. Abdomen: Bowel sounds noted.  Soft and non-distended. Extremities: No edema noted.  Laboratory:  Recent Labs Lab 11/10/13 0156 11/10/13 0203 11/10/13 0641  WBC 10.0  --  7.9  HGB 11.9* 13.9 11.4*  HCT 37.1 41.0 35.5*  PLT 283  --  234    Recent Labs Lab 11/10/13 0156 11/10/13 0203 11/10/13 0641 11/11/13 0947  NA 141 141 142 141  K 5.8* 5.5* 5.6* 5.3  CL 106 115* 106 103  CO2 21  --  23 20  BUN 32* 34* 33* 55*  CREATININE 1.75* 2.10* 1.76* 2.19*  CALCIUM 8.9  --  8.8 8.9  PROT  --   --  8.2  --   BILITOT  --   --  0.3  --   ALKPHOS  --   --  52  --   ALT  --   --  15  --   AST  --   --  21  --   GLUCOSE 122* 118* 135* 131*   Imaging/Diagnostic Tests: -Echocardiogram:  EF 65-70% with grade I diastolic dysfunction of left ventricle and severe LVH with small LV cavity, moderate aortic regurgitation, mild mitral stenosis and mitral regurgitation, normal RVSP, and abnormal relaxation with elevated filling pressures.  [address] [place], [place] 11/11/2013, 2:12 PM PGY-1, [facility] Family Medicine FPTS Intern pager: [phone], text pages welcome

## 2013-11-11 NOTE — Clinical Documentation Improvement (Signed)
Please clarify if pt's  Acute on Chronic CHF in setting of ESRD on HD, HTN, and CAD can be further specified as one of the diagnoses listed below and document in pn or d/c summary.   Possible Clinical Conditions?  Hypertensive Heart Disease Cardiorenal Syndrome  Other Condition________________________________________ Cannot Clinically Determine  Supporting Information:  Risk Factors: Signs & Symptoms: SOB, HTN, Resp failure, SSS, JVD to jawline Grade I diastolic dysfunction of left ventricle  -Severe LVH with small LV cavity  -Moderate aortic regurgitation  -Mild mitral stenosis and mitral regurgitation  The patient has known CKD stage 4 and a permanent catheter, but the daughter is refusing HD. The patient has been on chronic torsemide 10 mg po daily and is producing good amount of urine. Per CN 11/10/13  Diagnostics: CXR: IMPRESSION:1. Stable cardiomegaly and bilateral interstitial edema. 11/10/13 Treatment: IMDUR Toresmide 10 mg po daily  furosemide (LASIX) injection 40 mg    Thank You, Enis Slipper ,RN Clinical Documentation Specialist:  [phone]  [facility]- Health Information Management

## 2013-11-11 NOTE — Progress Notes (Signed)
OT Cancellation Note  Patient Details Name: Yvette [name] MRN: [id number] DOB: 1916-08-29   Cancelled Treatment:    Reason Eval/Treat Not Completed: OT screened, no needs identified, will sign off. Pt is total (A) at baseline and bed bound. Pt is hospice level care with pending palliative consult per POC with MD progress notes. Ot to sign off acutely.  Discussed with RN. Orders being d/ced from by MD at this time  Harolyn Rutherford Pager: [phone]  11/11/2013, 2:34 PM

## 2013-11-11 NOTE — Progress Notes (Addendum)
FMTS Attending Note  I personally saw and evaluated the patient. The plan of care was discussed with the resident team. I agree with the assessment and plan as documented by the resident.   Subjective obtained with help of patient's daughter, dyspnea is improved, patient's daughter to discuss goals of care with her brothers  Acute respiratory failure secondary to acute on chronic diastolic CHF - diuresing well, primary management of diuresis per cardiology team Hypertension-at goal per JNC 8, continue current regimen PAF- Currently rate controlled Acute kidney injury on CKD stage III - creatinine elevated today secondary to diuresis, continue to monitor  Donnella Sham M.D.

## 2013-11-11 NOTE — Progress Notes (Signed)
Clarified torsemide order with teaching service resident.  Patient already received one dose of 20 mg this morning.  Second ordered dose not given as is duplicate order for today.  Next dose to be given tomorrow as scheduled.

## 2013-11-11 NOTE — Progress Notes (Signed)
Patient WJ:XBJY Venuti      DOB: March 05, 1917      NWG:[id number]  No Call from [place] regarding official meeting for goals of care.  I will stop by in the am again if we don't hear from the family.  I would be glad to be present for meeting with the primary service if this will help facilitate understanding of the patient's limitations.  Family is always courteous when Palliative Consult placed but I can't force them to meet with [place]. We remain open and ready to embrace them when they are ready.   Ottilia Pippenger L. Ladona Ridgel, MD MBA The Palliative Medicine Team at [facility] Phone: [phone] Pager: [phone] ( Use team phone after hours)

## 2013-11-11 NOTE — Progress Notes (Signed)
       Patient Name: Yvette [name] Date of Encounter: 11/11/2013    SUBJECTIVE: Lethargic and not conversant. Denies discomfort. Daughter is present in the room and wants to take her home.  TELEMETRY:  Normal sinus rhythm Filed Vitals:   11/11/13 0900 11/11/13 1000 11/11/13 1100 11/11/13 1124  BP: 152/80 153/64 168/65 168/65  Pulse: 98 93 93 95  Temp:    98.8 F (37.1 C)  TempSrc:    Oral  Resp: Height:      Weight:      SpO2: 99% 99% 99% 99%    Intake/Output Summary (Last 24 hours) at 11/11/13 1241 Last data filed at 11/11/13 1100  Gross per 24 hour  Intake    300 ml  Output   1700 ml  Net  -1400 ml   I/O Net: -2820 cc since admission  LABS: Basic Metabolic Panel:  Recent Labs  91/47/82 0203 11/10/13 0641 11/11/13 0947  NA 141 142 141  K 5.5* 5.6* 5.3  CL 115* 106 103  CO2  --  23 20  GLUCOSE 118* 135* 131*  BUN 34* 33* 55*  CREATININE 2.10* 1.76* 2.19*  CALCIUM  --  8.8 8.9  MG  --  2.5  --   PHOS  --  5.8*  --    CBC:  Recent Labs  11/10/13 0156 11/10/13 0203 11/10/13 0641  WBC 10.0  --  7.9  NEUTROABS 6.6  --   --   HGB 11.9* 13.9 11.4*  HCT 37.1 41.0 35.5*  MCV 93.9  --  95.7  PLT 283  --  234   Cardiac Enzymes:  Recent Labs  11/10/13 0641 11/10/13 1200 11/10/13 1903  TROPONINI <0.30 <0.30 <0.30   BNP    Component Value Date/Time   PROBNP 7907.0* 11/10/2013 1200     Radiology/Studies:  Chest x-ray 11/10/13: IMPRESSION:  1. Stable cardiomegaly and bilateral interstitial edema.  2. Resolution of previously noted left lower lung consolidation.    Physical Exam: Blood pressure 168/65, pulse 95, temperature 98.8 F (37.1 C), temperature source Oral, resp. rate 16, height  (1.473 m), weight 97 lb 3.6 oz (44.1 kg), SpO2 99.00%. Weight change: -10.6 oz (-0.3 kg)  Wt Readings from Last 3 Encounters:  11/11/13 97 lb 3.6 oz (44.1 kg)  07/29/13 95 lb (43.092 kg)  10/04/12 100 lb 8.5 oz (45.6 kg)    Lethargic Neck  pain elevation is noted Chest is clear anteriorly Cardiac exam reveals a regular rhythm without gallop No edema is noted in the lower extremities  ASSESSMENT:  1. Acute on chronic diastolic heart failure, improved with diuresis 2. Acute on chronic kidney disease, stage III 3. Severe malnutrition 4. Elderly and  frail  Plan:  1. Diastolic heart failure has improved. She will need to be on a stronger diuretic regimen at discharge to prevent recurrence. He is at high risk for acute kidney injury because of variable volume intake. 2. Not a candidate for an invasive evaluation/therapy. Discussed this with the daughter. 3. If renal and clinical state are stable, could potentially be discharged home tomorrow 4. Should be hospice and palliative care   Signed, Lesleigh Noe 11/11/2013, 12:41 PM

## 2013-11-11 NOTE — Progress Notes (Signed)
Report given to receiving RN. Patient in bed sleeping with family at bedside. No signs or symptoms of distress. No verbal complaints from family.

## 2013-11-12 DIAGNOSIS — E43 Unspecified severe protein-calorie malnutrition: Secondary | ICD-10-CM

## 2013-11-12 DIAGNOSIS — Z9889 Other specified postprocedural states: Secondary | ICD-10-CM

## 2013-11-12 LAB — BASIC METABOLIC PANEL
Anion gap: 15 (ref 5–15)
BUN: 59 mg/dL — ABNORMAL HIGH (ref 6–23)
CALCIUM: 9.3 mg/dL (ref 8.4–10.5)
CO2: 28 meq/L (ref 19–32)
CREATININE: 1.88 mg/dL — AB (ref 0.50–1.10)
Chloride: 102 mEq/L (ref 96–112)
GFR calc non Af Amer: 22 mL/min — ABNORMAL LOW (ref >90)
GFR, EST AFRICAN AMERICAN: 25 mL/min — AB (ref >90)
Glucose, Bld: 148 mg/dL — ABNORMAL HIGH (ref 70–99)
Potassium: 4.1 mEq/L (ref 3.7–5.3)
Sodium: 145 mEq/L (ref 137–147)

## 2013-11-12 MED ORDER — TORSEMIDE 20 MG PO TABS: 20.0000 mg | ORAL_TABLET | Freq: Every day | ORAL | Status: DC

## 2013-11-12 NOTE — Progress Notes (Signed)
Family Medicine Teaching Service Daily Progress Note Intern Pager: [phone]  Patient name: Yvette [name] Medical record number: [id number] Date of birth: 1916/09/26 Age: 80 y.o. Gender: female  Primary Care Provider: Maryjean Ka, MD Consultants: Cardiology (Dr. Delton See), Palliative Care (Dr. Ladona Ridgel) Code Status: Full  Pt Overview and Major Events to Date:  11/10/13:  Admission to Cardiac SDU; Cardiology and Palliative Care Consulted; Echocardiogram; transitioned from Bipap to [facility] 11/11/13: Weaned to room air.  Assessment and Plan: Yvette [name] is a 80year old female with past medical history of CHF, CKD stage III, Paroxysmal atrial flutter, HTN, CAD, STEMI, and Unstable Angina, presenting with shortness of breath.  #Acute Respiratory Failure, most likely secondary to Acute on Chronic Diastolic CHF -Improved -DDx: CHF exacerbation vs. MI vs. Pneumonia vs. PE -Cardiology Consult Recommendations:   -Echocardiogram   -04/2010- EF 55% with moderate LVH   -11/10/13- EF 65-70%     -Grade I diastolic dysfunction of left ventricle    -Severe LVH with small LV cavity    -Moderate aortic regurgitation    -Mild mitral stenosis and mitral regurgitation    -Normal RVSP    -Abnormal relaxation with elevated filling pressures.  -Lasix  IV BID for two doses- given on 11/10/13  -Monitor Creatinine  -Consider using permcath for temporary hemodialysis to remove fluids -Transitioned to Room Air on 11/11/13  -Was initially on Bipap on 8/31, but then transitioned to [place] -Labs:  -Troponins: <0.30 x3  -BNP: 7907  -TSH: 1.69  -BMP: 145/4.1/102/28/59/1.88<148 -Medications:  -Torsemide  PO -PRN Medications:  -Acetaminophen  tablet/suppository q6hr PRN pain/fever  -Nitroglycerin 0.4mg  q14min PRN Chest Pain  -Family does not wish to meet with Palliative Medicine at this time, but was given the contact information should they chose to meet to discuss care in the future.  #HTN -Cardiology Consult  Recommendation:  -Increase Imdur to  PO -Continue Hydralazine  four times daily   #Paroxysmal Atrial Flutter -Continue Pindolol  and 2.5mg  at bed -Monitor on telemetry  #Unstable Angina -Continue Imdure, Pindolol, and Aspirin   #Sick Sinus Syndrome -Use Pindolol (tx of Paroxysmal atrial flutter and unstable angina) with caution. -Monitor on telemetry  #AKI on Stage III CKD -Cr:  1.88, down from 2.19 yesterday  -Baseline Cr: 1.2-1.7 -BUN:  59, up from 55 yesterday -Monitor CMP with Magnesium and Phosphorus -Hyperkalemia: Resolved  -K: 4.1   #Urinary Retention -Foley in place -Hold Uribel--has turned urine blue  FEN/GI: NPO, saline lock IV, Protonix , heart diet Social:  Case Management consult for heart failure health screen and PT/OT  PPx: Renally Dosed Lovenox   Disposition:  -Admitted to Cardiac SDU under Dr. McDiarmid. -Anticipated discharge today.  Subjective:  No acute complaints overnight.  Interview done with grandson, who speaks [place], as Mrs. Patton only speaks [place] and refuses to Chief Operating Officer.  Denies any pain or trouble breathing.  [name] states she is close to baseline in terms of alertness and behavior, but still seems a little more confused [place] prior to hospitalization.  Family does not wish to meet with Palliative Medicine at this time.  States she is ready to go home.  Objective: Temp:  [98 F (36.7 C)-98.8 F (37.1 C)] 98.5 F (36.9 C) (09/02 0442) Pulse Rate:  [86-95] 89 (09/02 0442) Resp:  [13-22] 16 (09/01 1542) BP: (116-168)/(50-96) 137/69 mmHg (09/02 0442) SpO2:  [92 %-100 %] 93 % (09/01 1542) Weight:  [88 lb 2.9 oz (40 kg)-89 lb 8.1 oz (40.6 kg)] 88 lb 2.9 oz (40  kg) (09/02 0442) Physical Exam: General: 80 yo female resting comfortably and in no apparent distress. More alert compared to yesterday. Cardiovascular: S1 and S2 noted. Regular rate and rhythm.  No murmurs noted. Respiratory: Clear to auscultation  bilaterally.  No wheezing. No increased work of breathing. Abdomen: Bowel sounds noted.  Soft and non-distended. Extremities: No edema noted.  Laboratory:  Recent Labs Lab 11/10/13 0156 11/10/13 0203 11/10/13 0641  WBC 10.0  --  7.9  HGB 11.9* 13.9 11.4*  HCT 37.1 41.0 35.5*  PLT 283  --  234    Recent Labs Lab 11/10/13 0641 11/11/13 0947 11/12/13 0732  NA 142 141 145  K 5.6* 5.3 4.1  CL 106 103 102  CO2 BUN 33* 55* 59*  CREATININE 1.76* 2.19* 1.88*  CALCIUM 8.8 8.9 9.3  PROT 8.2  --   --   BILITOT 0.3  --   --   ALKPHOS 52  --   --   ALT 15  --   --   AST 21  --   --   GLUCOSE 135* 131* 148*   Imaging/Diagnostic Tests: -Echocardiogram:  EF 65-70% with grade I diastolic dysfunction of left ventricle and severe LVH with small LV cavity, moderate aortic regurgitation, mild mitral stenosis and mitral regurgitation, normal RVSP, and abnormal relaxation with elevated filling pressures.  [address] [place], [place] 11/12/2013, 9:28 AM PGY-1, [facility] [place] [facility] FPTS Intern pager: [phone], text pages welcome

## 2013-11-12 NOTE — Progress Notes (Addendum)
DC V, DC Tele, DC Home. Discharge instructions and home medications discussed with family members. Family members translated discharge information. Patient's family members denied any questions or concerns at this time. Patient leaving unit via wheelchair with foley catheter and appears in no acute distress.

## 2013-11-12 NOTE — Progress Notes (Signed)
Upon D/C, foley catheter left in place, per MDO.

## 2013-11-12 NOTE — Progress Notes (Signed)
Subjective: The patient appears comfortable.  Family was at bedside and helped with communication.  Breathing well.  Objective: Vital signs in last 24 hours: Temp:  [98 F (36.7 C)-98.8 F (37.1 C)] 98.5 F (36.9 C) (09/02 0442) Pulse Rate:  [86-98] 89 (09/02 0442) Resp:  [13-22] 16 (09/01 1542) BP: (116-168)/(50-96) 137/69 mmHg (09/02 0442) SpO2:  [92 %-100 %] 93 % (09/01 1542) Weight:  [88 lb 2.9 oz (40 kg)-89 lb 8.1 oz (40.6 kg)] 88 lb 2.9 oz (40 kg) (09/02 0442) Last BM Date: 11/09/13  Intake/Output from previous day: 09/01 0701 - 09/02 0700 In: 1345 [P.O.:470] Out: 1450 [Urine:1450] Intake/Output this shift:    Medications Current Facility-Administered Medications  Medication Dose Route Frequency Provider Last Rate Last Dose  . acetaminophen (TYLENOL) tablet 650 mg  650 mg Oral Q6H PRN Tyrone Nine, MD       Or  . acetaminophen (TYLENOL) suppository 650 mg  650 mg Rectal Q6H PRN Tyrone Nine, MD      . aspirin EC tablet 81 mg  81 mg Oral Daily Tyrone Nine, MD   81 mg at 11/11/13 1125  . chlorhexidine (PERIDEX) 0.12 % solution 15 mL  15 mL Mouth/Throat BID Leighton Roach McDiarmid, MD   15 mL at 11/11/13 2009  . enoxaparin (LOVENOX) injection 20 mg  20 mg Subcutaneous Q24H Tyrone Nine, MD   20 mg at 11/11/13 1713  . feeding supplement (ENSURE COMPLETE) (ENSURE COMPLETE) liquid 237 mL  237 mL Oral BID BM Normand Sloop, RD   237 mL at 11/11/13 1459  . hydrALAZINE (APRESOLINE) tablet 50 mg  50 mg Oral QID Tyrone Nine, MD   50 mg at 11/11/13 2108  . isosorbide mononitrate (IMDUR) 24 hr tablet 30 mg  30 mg Oral Daily Lars Masson, MD   30 mg at 11/11/13 1124  . multivitamin with minerals tablet 1 tablet  1 tablet Oral Daily Tyrone Nine, MD   1 tablet at 11/11/13 1124  . nitroGLYCERIN (NITROSTAT) SL tablet 0.4 mg  0.4 mg Sublingual Q5 min PRN Tyrone Nine, MD      . pantoprazole (PROTONIX) EC tablet 40 mg  40 mg Oral Daily Tyrone Nine, MD   40 mg at 11/11/13 1126  .  pindolol (VISKEN) tablet 2.5 mg  2.5 mg Oral QHS Tyrone Nine, MD   2.5 mg at 11/11/13 2108  . pindolol (VISKEN) tablet 5 mg  5 mg Oral Daily Leighton Roach McDiarmid, MD   5 mg at 11/11/13 1125  . sodium chloride 0.9 % injection 3 mL  3 mL Intravenous Q12H Tyrone Nine, MD   3 mL at 11/11/13 0900  . sodium chloride 0.9 % injection 3 mL  3 mL Intravenous Q12H Tyrone Nine, MD   3 mL at 11/11/13 2108  . sodium chloride 0.9 % injection 3 mL  3 mL Intravenous PRN Tyrone Nine, MD      . torsemide [facility]) tablet 20 mg  20 mg Oral Daily Leighton Roach McDiarmid, MD        PE: General appearance: alert, cooperative and no distress Lungs: clear to auscultation bilaterally Heart: regular rate and rhythm, S1, S2 normal, no murmur, click, rub or gallop Abdomen: Soft, no distention Extremities: No LEE Pulses: 2+ and symmetric Skin: Warm and dry Neurologic: Grossly normal  Lab Results:   Recent Labs  11/10/13 0156 11/10/13 0203 11/10/13 0641  WBC 10.0  --  7.9  HGB 11.9* 13.9 11.4*  HCT 37.1 41.0 35.5*  PLT 283  --  234   BMET  Recent Labs  11/10/13 0156 11/10/13 0203 11/10/13 0641 11/11/13 0947  NA 141 141 142 141  K 5.8* 5.5* 5.6* 5.3  CL 106 115* 106 103  CO2 21  --  23 20  GLUCOSE 122* 118* 135* 131*  BUN 32* 34* 33* 55*  CREATININE 1.75* 2.10* 1.76* 2.19*  CALCIUM 8.9  --  8.8 8.9   PT/INR  Recent Labs  11/10/13 0156  LABPROT 14.6  INR 1.14    Assessment/Plan   Active Problems:   Acute on Chronic diastolic heart failure   Acute on CKD (chronic kidney disease), stage III   CAD (coronary artery disease)   Respiratory distress   Chronic indwelling Foley catheter   Protein-calorie malnutrition, severe  Plan:   She looks euvolemic.  Will need daily weights.  BP ok.  Net fluids: -0.1L/-3.1.  SCr appears stable although elevated. Cardiac Meds:  Torsemide  daily, imdur 30, hydralazine 50 QID, pindolol.=  QD and 2.5QHS.  Palliative medicine trying to set up a meeting.      LOS: 2 days    HAGER, BRYAN PA-C 11/12/2013 7:39 AM  As above; no dyspnea; not volume overloaded on exam; continue present dose of demadex; she will need close fu of renal function following DC; FU with Dr Antoine Poche 2-4 weeks following DC. Agree with palliative care evaluation. Olga Millers

## 2013-11-12 NOTE — Discharge Instructions (Signed)
You were admitted to [facility] on 11/10/13 for Heart Failure.  Pictures of your heart (Echocardiogram) confirmed Congestive Heart Failure. You were treated with diuretics (Lasix and Torsemide) and your breathing improved.  We have set up an appointment on 11/18/13 with Dr. Althea Charon, who works with Dr. Casper Harrison, to check on how you are doing after leaving the hospital.  He will look at how the diuretics are working and check on your kidneys at this visit.  Please follow up with your heart doctor, Dr. Antoine Poche, on 12/17/13   Yvette [name] (Heart Failure) Yvette [name] l tnh tr?ng [name] c v?n ?? v? b?m mu. ?i?u ny c ngh?a l [name] khng b?m ?? mu ?? c? th? c?a qu v? ho?t ??ng t?t. Trong m?t s? tr??ng h?p Yvette [name], d?ch c th? tro ng??c vo ph?i ho?c qu v? b? s?ng n? (ph) c?ng chn. Yvette [name] th??ng l tnh tr?ng lu di (m?n tnh). ?i?u quan trong ??i v?i qu v? l t? ch?m [place] t?t cho b?n thn v th?c hi?n theo k? ho?ch ?i?u tr? c?a chuyn gia ch?m [place] s?c kh?e. Yvette [name] NHN  M?t s? tnh tr?ng s?c kh?e c th? gy ra Yvette [name]. Nh?ng tnh tr?ng s?c kh?e bao g?m:  Huy?t p cao (t?ng huy?t p) T?ng huy?t p lm cho c? [name] lm vi?c nhi?u h?n bnh th??ng. Khi p su?t trong m?ch mu cao, [name] c?n b?m (co bp) b?ng nhi?u l?c h?n ?? l?u thng mu trn kh?p c? th?. Huy?t p cao cu?i cng s? lm cho [name] tr? nn c?ng v y?u.  B?nh ??ng m?ch vnh (CAD). CAD l s? tch t? cholesterol v m? (m?ng bm) trong ??ng m?ch c?a [name]. T?c ??ng m?ch l?y ?i oxy v mu c?a c? [name]. ?i?u ny c th? gy ?au ng?c v c th? d?n ??n nh?i mu c? [name]. Huy?t p cao c?ng c th? gp phn gy ra CAD.  ?au [name] (nh?i mu c? [name]). Nh?i mu c? [name] xu?t hi?n khi m?t ho?c nhi?u ??ng m?ch trong [name] b? t?c. M?t oxy lm t?n th??ng m c? [name]. Khi ?i?u ny x?y ra, m?t ph?n c?a c? [name] s? ch?t. M b? t?n th??ng khng co bp t?t v lm Yvette y?u kh? n?ng b?m mu c?a [name].  Van [name] d? th??ng. Khi van [name] khng m? v ?ng bnh th??ng, n c th? gy Yvette [name]. ?i?u ny  lm cho c? [name] b?m nhi?u h?n ?? gi? cho mu ch?y lin t?c.  B?nh c? [name] (b?nh c? [name] ho?c vim c? [name]). B?nh c? [name] l t?n th??ng c? tim do nh?ng Yvette [name] nhn khc nhau. Nh?ng Yvette [name] nhn ny c th? bao g?m l?m d?ng ma ty ho?c r??u, nhi?m trng ho?c khng r l do. Nh?ng y?u t? ny c th? lm t?ng nguy c? Yvette [name].  B?nh ph?i. B?nh ph?i khi?n cho [name] lm vi?c nhi?u h?n v ph?i khng ho?t ??ng bnh th??ng. ?i?u ny c th? gy ra tr?ng thi c?ng th?ng trn [name], d?n ??n Yvette [name].  Ti?u ???ng. Ti?u ???ng lm t?ng nguy c? Yvette [name]. ???ng trong mu cao gp ph?n lm n?ng ?? m? (lipit) trong mu cao. Ti?u ???ng c?ng c th? t? t? gy t?n th??ng cc m?ch mu nh? mang nh?ng ch?t dinh d??ng quan tr?ng ??n c? [name]. Khi [name] khng nh?n ?? oxy v mu, n c th? lm [name] tr? nn y?u v c?ng. ?i?u ny lm cho [name] khng co bp hi?u qu?Marland Kitchen  Cc tnh tr?ng khc c th? gp ph?n gy Yvette [name]. Cc tnh tr?ng ? bao g?m nh?p [name] b?t th??ng, b?nh v? tuy?n gip v s? l??ng t? bo mu th?p (thi?u mu). M?t s? hnh vi khng lnh m?nh c th? lm t?ng nguy c? Yvette [name], bao g?m:  Th?a cn.  Ht thu?c ho?c nhai thu?c l.  ?n th?c ?n nhi?u ch?t bo v cholesterol.  L?m d?ng ma ty b?t h?p php ho?c r??u.  Thi?u ho?t ??ng th? ch?t. TRI?U CH?NG  Tri?u ch?ng Yvette [name] c th? khc nhau v c th? kh pht hi?n. Tri?u ch?ng c th? bao g?m:  Kh th? khi ho?t ??ng, ch?ng h?n nh? leo c?u thang.  Ho lin t?c.  S?ng ? bn chn, c? chn, chn ho?c b?ng.  T?ng cn khng r Yvette [name] nhn.  Kh th? khi n?m th?ng (kh th? n?m).  T?nh gi?c trong khi ng? v c?n ng?i d?y v ht th? thm khng kh.  [name] ??p nhanh.  M?t m?i v m?t s?c.  C?m gic chong vng, chng m?t ho?c s?p s?a ng?t.  ?n khng ngon mi?ng.  Bu?n nn.  ?i ti?u nhi?u vo ban ?m (ti?u ?m). CH?N ?ON  Ch?n ?on Yvette [name] d?a vo b?nh s? c?a qu v?, tri?u ch?ng, khm th?c th? v cc ki?m tra ch?n ?on. Cc ki?m tra ch?n ?on Yvette [name] c th? bao g?m:  Siu m [name].  ?i?n  tm ??.  Ch?p X quang ng?c.  Xt nghi?m mu.  Nghi?m php g?ng s?c.  Ch?p ??ng m?ch [name].  Ch?p qut h?t nhn phng x?. ?I?U TR?  ?i?u tr? nh?m qu?n l tri?u ch?ng Yvette [name]. Thu?c, thay ??i hnh vi ho?c can thi?p b?ng ph?u thu?t c th? c?n ?? ?i?u tr? Yvette [name].  Thu?c gip ?i?u tr? Yvette [name] c th? bao g?m:  Thu?c ?c ch? men chuy?n angiotensin (ACE). Lo?i thu?c ny ng?n ch?n tc d?ng c?a m?t lo?i protein trong mu c tn l men chuy?n angiotensin. Cc thu?c ?c ch? ACE lm gin (gin n?) m?ch mu v gip gi?m p huy?t.  Thu?c ch?n th? th? angiotensin (ARB). Lo?i thu?c ny ng?n ch?n ho?t ??ng c?a m?t lo?i protein trong mu c tn l angiotensin. Thu?c ch?n th? th? angiotensin lm gin cc m?ch mu v gip h? huy?t p.  Thu?c lm ?i ti?u nhi?u (thu?c l?i ti?u). Thu?c l?i ti?u lm cho th?n lo?i b? mu?i v n??c ra kh?i mu. L??ng d?ch d? th?a ???c lo?i b? qua ???ng ti?u. Vi?c lo?i b? l??ng d?ch d? th?a ny s? lm gi?m kh?i l??ng mu [name] b?m.  Thu?c ch?n beta. Thu?c ny ng?n khng cho [name] ??p qu nhanh v c?i thi?n s?c m?nh c?a c? [name].  Thu?c tr? [name] (digitalis). Thu?c ny lm t?ng s?c m?nh c?a nh?p [name].  Thay ??i hnh vi lnh m?nh bao g?m:  C v duy tr tr?ng l??ng kh?e m?nh.  D?ng ht thu?c ho?c nhai thu?c l.  ?n th?c ?n t?t cho [name].  H?n ch? ho?c trnh u?ng r??u.  Ng?ng s? d?ng ma ty b?t h?p php.  Ho?t ??ng th? ch?t theo ch? d?n c?a chuyn gia ch?m [place] s?c kh?e.  ?i?u tr? b?ng ph?u thu?t cho Yvette [name] c th? bao g?m:  Th? thu?t thng ??ng m?ch b? t?c, s?a ch?a van [name] b? t?n th??ng ho?c lo?i b? m c? [name] b? t?n th??ng.  My tr? [name] ?? c?i thi?n ch?c n?ng c?a c? [name] v ki?m sot  nh?ng nh?p [name] khng bnh th??ng nh?t ??nh.  M?t my kh? rung [name] bn trong ?? ?i?u tr? m?t s? nh?p [name] b?t th??ng nghim tr?ng nh?t ??nh.  M?t thi?t b? h? tr? tm th?t tri (LVAD) ?? h? tr? kh? n?ng b?m c?a [name]. H??NG D?N CH?M [place] T?I NH   Ch? s? d?ng thu?c theo ch? d?n c?a chuyn gia ch?m [place]  s?c kh?e. Thu?c ?ng vai tr quan tr?ng trong vi?c lm gi?m kh?i l??ng cng vi?c c?a [name], lm ch?m s? ti?n tri?n c?a Yvette [name] v c?i thi?n cc tri?u ch?ng c?a qu v?.  Khng ng?ng s? d?ng thu?c tr? khi ???c chuyn gia ch?m [place] s?c kh?e ch? d?n.  Khng b? b?t k? li?u thu?c no.  Mua thm ?? thu?c theo ??n tr??c khi h?t thu?c. Thu?c c?a qu v? l c?n thi?t m?i ngy.  Tham gia ho?t ??ng th? ch?t ? m?c ?? v?a ph?i n?u ???c chuyn gia ch?m [place] s?c kh?e ch? d?n. Ho?t ??ng th? ch?t ? m?c ? v?a ph?i c th? c l?i cho m?t s? ng??i. Ng??i cao tu?i v nh?ng ng??i b? Yvette [name] n?ng c?n tham kh?o  ki?n c?a chuyn gia ch?m [place] s?c kh?e ?? c ???c l?i khuyn v? ho?t ??ng th? ch?t.  ?n th?c ?n t?t cho [name]. Nn l?a ch?n th?c ph?m khng c ch?t bo chuy?n d?ng, ch?t bo bo ha, cholesterol v mu?i (natri). L?a ch?n c l?i cho s?c kh?e bao g?m tri cy v rau t??i ho?c ?ng l?nh, c, th?t n?c, cc lo?i ??u, cc s?n ph?m s?a khng bo ho?c t ch?t bo v ng? c?c Yvette [name] h?t ho?c cc lo?i th?c ph?m giu ch?t x?. Ni chuy?n v?i chuyn gia dinh d??ng ?? tm hi?u thm v? nh?ng lo?i th?c ph?m t?t cho [name].  H?n ch? dng mu?i theo ch? d?n c?a chuyn gia ch?m [place] s?c kh?e. H?n ch? mu?i c th? lm gi?m tri?u ch?ng Yvette [name] ? m?t s? ng??i. Ni chuy?n v?i chuyn gia dinh d??ng ?? tm hi?u thm v? nh?ng lo?i gia v? t?t cho [name].  S? d?ng cc ph??ng php n?u ?n c l?i cho s?c kh?e. Ph??ng php n?u ?n c l?i cho s?c kh?e bao g?m rang, b? l, hun nng, n??ng, lu?c, h?p hay xo. Ni chuy?n v?i chuyn gia dinh d??ng ?? tm hi?u thm v? ph??ng php n?u ?n c l?i cho s?c kh?e.  H?n ch? n??c theo ch? d?n c?a chuyn gia ch?m [place] s?c kh?e. H?n ch? n??c c th? lm gi?m tri?u ch?ng Yvette [name] ? m?t s? ng??i.  T? ki?m tra cn n?ng m?i ngy. Ki?m tra cn n?ng hng ngy r?t quan tr?ng trong vi?c nh?n bi?t s?m v?n ?? th?a n??c. Qu v? nn t? ki?m tra cn n?ng m?i bu?i sng sau khi ?i ti?u v tr??c khi ?n sng. M?c cng m?t l??ng qu?n o m?i l?n t?  ki?m tra cn n?ng. Ghi l?i cn n?ng hng ngy c?a qu v?. Cung c?p ph?n ghi chp cn n?ng c?a qu v? cho chuyn gia ch?m [place] s?c kh?e.  Theo di v ghi l?i huy?t p c?a qu v? n?u ???c chuyn gia ch?m [place] s?c kh?e ch? d?n.  Ki?m tra nh?p [name] n?u ???c chuyn gia ch?m [place] s?c kh?e ch? d?n.  Gi?m cn n?u ???c chuyn gia ch?m  s?c kh?e ch? d?n. Gi?m cn c th? lm gi?m tri?u ch?ng Yvette [name] ? m?t s? ng??i.  D?ng ht thu?c ho?c d?ng nhai thu?c  l. Nicotine lm cho [name] qu v? lm vi?c nhi?u h?n v n lm cho cc m?ch mu cht h?t l?i. Khng s? d?ng k?o ho?c mi?ng dn c nicotine tr??c khi ni chuy?n v?i chuyn gia ch?m [place] s?c kh?e.  Tun th? m?i cu?c h?n khm l?i theo ch? d?n c?a chuyn gia ch?m [place] s?c kh?e. ?i?u ny l quan tr?ng.  Gi?i h?n l??ng r??u qu v? u?ng khng qu 1 ly m?i ngy v?i ph? n? khng mang thai v 2 ly m?i ngy v?i nam gi?i. 1 ly l t??ng ???ng v?i 12 aox? bia, 5 aox? r??u vang, ho?c 1 aox? r??u m?nh. U?ng nhi?u h?n th? s? c h?i cho [name] c?a qu v?. Cho chuyn gia ch?m [place] s?c kh?e bi?t n?u qu v? u?ng r??u vi l?n m?i tu?n. Ni chuy?n v?i chuyn gia ch?m  s?c kh?e v? vi?c u?ng r??u c an ton cho qu v? hay khng. N?u [name] qu v? ? b? t?n th??ng b?i r??u ho?c qu v? b? Yvette [name] n?ng, qu v? nn d?ng h?n vi?c u?ng r??u.  Ng?ng s? d?ng ma ty b?t h?p php.  Lun c?p nh?t cc l?n [name] ch?ng. ?i?u ??c bi?t quan tr?ng l ng?n ng?a nhi?m trng ???ng h h?p thng qua cc l?n [name] ch?ng ph? c?u khu?n v cm hi?n nay.  Qu?n l cc tnh tr?ng s?c kh?e khc nh? t?ng huy?t p, ti?u ???ng, b?nh tuy?n gip ho?c nh?p [name] b?t th??ng theo ch? d?n c?a chuyn gia ch?m  s?c kh?e.  H?c cch qu?n l c?ng th?ng.  L?p k? ho?ch v? th?i gian ngh? ng?i khi m?t m?i.  Tm hi?u cc chi?n l??c qu?n l nhi?t ?? cao. N?u th?i ti?t v cng nng:  Trnh ho?t ??ng th? ch?t m?nh.  S? d?ng ?i?u ha khng kh ho?c qu?t ho?c tm ch? mt.  Trnh caffeine v r??u.  M?c qu?n o r?ng, nh? v sng  mu.  Tm hi?u cc chi?n l??c ?? qu?n l nhi?t ?? th?p. N?u th?i ti?t v cng l?nh:  Trnh ho?t ??ng th? ch?t m?nh.  M?c nhi?u l?p qu?n o.  ?eo g?ng tay, ??i m? v qung kh?n khi ?i ra ngoi.  Trnh u?ng r??u.  Ti?p nh?n gio d?c v h? tr? th??ng xuyn khi c?n.  Tham gia ho?c tm cch ph?c h?i ch?c n?ng khi c?n thi?t ?? duy tr ho?c c?i thi?n kh? n?ng ??c l?p v ch?t l??ng cu?c s?ng. ?I KHM N?U:   Cn n?ng c?a qu v? t?ng thm 3 pao (1,4 kg) trong 1 ngy ho?c 5 pao (2,3 kg) trong 1 tu?n.  Qu v? b? kh th? nhi?u h?n v ?i?u ny l khng bnh th??ng v?i qu v?.  Qu v? khng th? tham gia vo cc ho?t ??ng th? ch?t thng th??ng.  Qu v? d? dng b? m?t m?i.  Qu v? ho nhi?u h?n bnh th??ng, ??c bi?t l v?i ho?t ??ng th? ch?t.  Qu v? b? s?ng nhi?u h?n ? nh?ng ch? nh? tay, chn, c? chn ho?c b?ng.  Qu v? khng th? ng? ???c v kh th?.  Qu v? c?m th?y nh? [name] ??p nhanh (?nh tr?ng ng?c).  Qu v? b? chng m?t ho?c chong vng khi ??ng ln. NGAY L?P T?C ?I KHM N?U:   Qu v? b? kh th?.  C thay ??i tr?ng thi tinh th?n nh? gi?m t?nh to ho?c kh t?p trung.  Qu v? b? ?au ho?c kh ch?u ? ng?c.  Qu v? b? ng?t x?u (  b?t t?nh). ??M B?O QU V?:   Hi?u r cc h??ng d?n ny.  S? theo di tnh tr?ng c?a mnh.  S? yu c?u tr? gip ngay l?p t?c n?u qu v? c?m th?y khng kh?e ho?c th?y tr?m tr?ng h?n. Document Released: 02/27/2005 Document Revised: 07/14/2013 [facility] Patient Information 2015 [place], [place]. This information is not intended to replace advice given to you by your health care provider. Make sure you discuss any questions you have with your health care provider.

## 2013-11-12 NOTE — Progress Notes (Signed)
FMTS Attending Note  I personally saw and evaluated the patient. The plan of care was discussed with the resident team. I agree with the assessment and plan as documented by the resident.   Family at bedside, patient more alert today, mental status back to baseline  Per RN staff patient has short run on SVT on telemetry while taking medications, back in NSR, no associated chest pain or sob  Cardiology has signed off. Palliative care has not yet been able to set up a family meeting.   Acute respiratory failure secondary to acute on chronic diastolic CHF - patient clinically appears well, stable per cardiology for discharge Hypertension-at goal per JNC 8, continue current regimen PAF- Currently rate controlled Acute kidney injury on CKD stage III - creatinine trending downward Short run of SVT on telemetry - patient asymptomatic, back in NSR, resident will make cardiology aware however likely no further intervention given advanced age and co-morbidities  Likely discharge home today pending palliative care recs.   Donnella Sham MD

## 2013-11-12 NOTE — Progress Notes (Signed)
Patient VO:ZDGU Surles      DOB: 02-15-1917      YQI:[id number]  Discussed Palliative Med Consult request with PGY1.  Family aware that we are here to help them but they decline to engage. The consult should be mutually desired in order to be effective.  [place] and her brother are surrogates, they need to approve and want to meet.  Other family members should encourage them to do so but do not speak for the patient nor should they be taken as surrogates.  We are always able to engage when patient is in the hospital but we can not force the family to meet. Patient to discharge today without formal goals of care.  Please consult [place] again when she is admitted. We can always check in and hope that one day they will permit [place] to help.   Thank you for your consultation.  Francisca Harbuck L. Ladona Ridgel, MD MBA The Palliative Medicine Team at [facility] Phone: [phone] Pager: [phone] ( Use team phone after hours)

## 2013-11-12 NOTE — Progress Notes (Signed)
DC IV, DC tele, DC home. Discharge instructions and home medications discussed with patient. Pt denied any questions or concerns at this time. Pt leaving unit via wheelchair and appears in no acute distress.   

## 2013-11-13 NOTE — Discharge Summary (Signed)
I agree with the discharge summary as documented.   Essa Wenk MD  

## 2013-11-18 ENCOUNTER — Encounter: Payer: Self-pay | Admitting: Family Medicine

## 2013-11-18 ENCOUNTER — Ambulatory Visit (INDEPENDENT_AMBULATORY_CARE_PROVIDER_SITE_OTHER): Payer: Medicaid Other | Admitting: Family Medicine

## 2013-11-18 VITALS — BP 130/49 | HR 78 | Temp 98.5°F | Ht <= 58 in | Wt 95.3 lb

## 2013-11-18 DIAGNOSIS — R339 Retention of urine, unspecified: Secondary | ICD-10-CM

## 2013-11-18 DIAGNOSIS — N183 Chronic kidney disease, stage 3 unspecified: Secondary | ICD-10-CM

## 2013-11-18 DIAGNOSIS — I200 Unstable angina: Secondary | ICD-10-CM

## 2013-11-18 DIAGNOSIS — I509 Heart failure, unspecified: Secondary | ICD-10-CM

## 2013-11-18 DIAGNOSIS — I10 Essential (primary) hypertension: Secondary | ICD-10-CM

## 2013-11-18 DIAGNOSIS — I5032 Chronic diastolic (congestive) heart failure: Secondary | ICD-10-CM

## 2013-11-18 NOTE — Progress Notes (Signed)
   Subjective:    Patient ID: Yvette [name], female    DOB: 1916-11-12, 80 y.o.   MRN: [id number]  History provided by patient and son (one of primary care givers), with assistance of both Son and [place] interpreter present for interview.   HPI  Hospital follow-up appointment, recently hospitalized 11/10/13 to 11/12/13 for dyspnea, treated for an acute CHF exacerbation.  Acute CHF Exacerbation, in setting of Chronic Diastolic (Grade 1) CHF - Reports that she has significantly improved since hospitalization. Previously with "difficulty breathing" but did not endorse swelling or chest pain before. Currently with resolved symptoms. No new concerns today. - Reports weight has been stable, has not missed any medications, brought appropriate discharge med list to visit. Currently taking recently changed Torsemide  PO (3 days weekly) - Eating and drinking regularly - Denies any SOB, CP, worsening orthopnea, lower ext edema, fevers/chills, cough  Cardiac: Unstable Angina, PAF, SSS - Reports that she is followed by Cardiology, Dr. Antoine Poche. Has a hospital follow-up appointment scheduled for 12/17/13. - Compliant with medications, recently increased Imdur to  daily (on discharge), Son reports compliance with this, however he is unaware if this is her current dose, as his Aunt admins medicines. - Currently denies any chest pain, palpitations, lightheadedness or dizziness  CHRONIC HTN: Reports no concerns, does not check regularly at home Current Meds - Hydralazine PO, Imdur, Pindolol, Torsemide   Reports good compliance, took meds today. Tolerating well, w/o complaints. Denies HA, dizziness / lightheadedness  AKI, in setting of CKD-Stage III / Chronic Urinary Retention - Reports continues to make good amount of urine since leaving hospital - Chronic indwelling foley catheter, changed monthly - Denies any abdominal / suprapubic pain, dysuria, flank pain, hematuria   I have reviewed and  updated the following as appropriate: allergies and current medications  Social Hx: - Never smoke - Lives at home with Son and his Aunt who are primary caregivers for patient - Mostly wheelchair bound  Review of Systems  See above HPI    Objective:   Physical Exam  BP 130/49  Pulse 78  Temp(Src) 98.5 F (36.9 C) (Oral)  Ht  (1.397 m)  Wt 95 lb 4.8 oz (43.228 kg)  BMI 22.15 kg/m2  Gen - thin frail elderly Female, cooperative, in wheelchair, NAD HEENT - PERRL, EOMI, difficulty hearing, poor dentition, mild dry MM Heart - RRR, no murmurs heard Lungs - Mostly CTAB, with scattered rhonchi, no significant focal crackles heard. Normal WOB Abd - soft, non-tender, mild distention unchanged, no masses, +active BS Ext - non-tender, no edema, peripheral pulses intact +2 b/l Skin - warm, dry, no rashes Neuro - awake, alert     Assessment & Plan:   See specific A&P problem list for details.

## 2013-11-18 NOTE — Assessment & Plan Note (Signed)
Stable  Plan: 1. Continue current changes to meds, BP seems to tolerate, and asymptomatic

## 2013-11-18 NOTE — Assessment & Plan Note (Signed)
Stable, regular UOP with indwelling foley  Plan: 1. Continue change Foley q 1 month 2. Continue to f/u with Urology PRN

## 2013-11-18 NOTE — Assessment & Plan Note (Addendum)
Resolved, no evidence of acute CHF exacerbation or fluid overload

## 2013-11-18 NOTE — Assessment & Plan Note (Signed)
Stable, improved following recent hospitalization for acute exacerbation - Last ECHO 11/10/13 with EF 60-65%, Grade 1 Diastolic dysfunction, severe concentric LVH with small LV cavity. Moderate AR, mild MS - Meds - recently on discharge inc Torsemide to  PO x 3 days weekly  Plan: 1. Continue Torsemide 2. Recommend daily wts, salt and fluid restriction as previously advised 3. Follow-up with Cardiology 12/17/13

## 2013-11-18 NOTE — Assessment & Plan Note (Signed)
Stable, no symptoms currently - Meds: increased Imdur to  daily on discharge  Plan: 1. Continue increased Imdur dose, BP stable and tolerating current regimen 2. Follow-up with Cardiology 12/17/13

## 2013-11-18 NOTE — Patient Instructions (Signed)
Dear Yvette [name], Thank you for coming in to clinic today.  Today we discussed your Recent Hospitalization. 1. It sounds like overall you are doing much better. Breathing better and without chest pain. 2. Your lungs sound good, and I do not hear any evidence of fluid. 3. Recommend to continue current medications as they are (including new Torsemide, and Imdur). 4. Continue to take in regular food and drink daily, focus on improving hydration.  Please follow-up with your Cardiologist (Dr. Antoine Poche) as scheduled 12/17/13.  We will check basic labs today and check your Kidney function. We will mail you letter with results.  Please schedule a follow-up appointment with Dr. McDiarmid in 1 to 3 months for follow-up / may also schedule "Geriatric Clinic" visit.  If you have any other questions or concerns, please feel free to call the clinic to contact me. You may also schedule an earlier appointment if necessary.  However, if your symptoms get significantly worse, please go to the Emergency Department to seek immediate medical attention.  Saralyn Pilar, DO [facility]

## 2013-11-18 NOTE — Assessment & Plan Note (Signed)
Stable, improved Cr on discharge from hospital with resolution of AKI  Plan: 1. Re-check BMET today, follow-up improving Cr trend

## 2013-11-19 ENCOUNTER — Encounter: Payer: Self-pay | Admitting: Family Medicine

## 2013-11-19 LAB — BASIC METABOLIC PANEL
BUN: 36 mg/dL — ABNORMAL HIGH (ref 6–23)
CHLORIDE: 109 meq/L (ref 96–112)
CO2: 22 meq/L (ref 19–32)
CREATININE: 1.32 mg/dL — AB (ref 0.50–1.10)
Calcium: 8.6 mg/dL (ref 8.4–10.5)
Glucose, Bld: 89 mg/dL (ref 70–99)
Potassium: 4.9 mEq/L (ref 3.5–5.3)
Sodium: 136 mEq/L (ref 135–145)

## 2013-11-24 ENCOUNTER — Inpatient Hospital Stay: Payer: Medicaid Other | Admitting: Obstetrics and Gynecology

## 2013-12-17 ENCOUNTER — Ambulatory Visit: Payer: Medicaid Other | Admitting: Cardiology

## 2013-12-25 ENCOUNTER — Ambulatory Visit (INDEPENDENT_AMBULATORY_CARE_PROVIDER_SITE_OTHER): Payer: Medicaid Other | Admitting: Family Medicine

## 2013-12-25 ENCOUNTER — Encounter: Payer: Self-pay | Admitting: Family Medicine

## 2013-12-25 VITALS — BP 153/80 | HR 85 | Temp 98.3°F | Wt 95.0 lb

## 2013-12-25 DIAGNOSIS — H269 Unspecified cataract: Secondary | ICD-10-CM

## 2013-12-25 DIAGNOSIS — H9193 Unspecified hearing loss, bilateral: Secondary | ICD-10-CM

## 2013-12-25 DIAGNOSIS — H6122 Impacted cerumen, left ear: Secondary | ICD-10-CM

## 2013-12-25 DIAGNOSIS — H547 Unspecified visual loss: Secondary | ICD-10-CM | POA: Insufficient documentation

## 2013-12-25 DIAGNOSIS — Z0000 Encounter for general adult medical examination without abnormal findings: Secondary | ICD-10-CM | POA: Diagnosis not present

## 2013-12-25 DIAGNOSIS — G4720 Circadian rhythm sleep disorder, unspecified type: Secondary | ICD-10-CM | POA: Insufficient documentation

## 2013-12-25 DIAGNOSIS — Z7409 Other reduced mobility: Secondary | ICD-10-CM | POA: Insufficient documentation

## 2013-12-25 DIAGNOSIS — Z23 Encounter for immunization: Secondary | ICD-10-CM

## 2013-12-25 DIAGNOSIS — H9190 Unspecified hearing loss, unspecified ear: Secondary | ICD-10-CM | POA: Insufficient documentation

## 2013-12-25 DIAGNOSIS — F0390 Unspecified dementia without behavioral disturbance: Secondary | ICD-10-CM

## 2013-12-25 DIAGNOSIS — H6120 Impacted cerumen, unspecified ear: Secondary | ICD-10-CM | POA: Insufficient documentation

## 2013-12-25 HISTORY — DX: Unspecified cataract: H26.9

## 2013-12-25 NOTE — Patient Instructions (Addendum)
Yvette [name] has dementia which is not uncommon at her age with over 75% of patients Age 78 or more having dementia. She does not appear to have the behavior and psychologic problems that some patient's with dementia get. She is being well supported by her family.  Keep having her walk with assistance at home to help with keeping her muscles as strong as you can.   She had some ear wax on left side.  To keep ear wax from building up, put three drops of mineral oil or olive oil in ear twice a week.  Yvette [name] had a Influenza Vaccination today.  Yvette [name] has poor vision and her hearing is decreased.  \ Try to speak facing her, speak a usual speed and loudness but enuciate as clearly as possible.   Dementia Dementia is a word that is used to describe problems with the brain and how it works. People with dementia have memory loss. They may also have problems with thinking, speaking, or solving problems. It can affect how they act around people, how they do their job, their mood, and their personality. These changes may not show up for a long time. Family or friends may not notice problems in the early part of this disease. HOME CARE The following tips are for the person living with, or caring for, the person with dementia. Make the home safe.  Remove locks on bathroom doors.  Use childproof locks on cabinets where alcohol, cleaning supplies, or chemicals are stored.  Put outlet covers in electrical outlets.  Put in childproof locks to keep doors and windows safe.  Remove stove knobs, or put in safety knobs that shut off on their own.  Lower the temperature on water heaters.  Label medicines. Lock them in a safe place.  Keep knives, lighters, matches, power tools, and guns out of reach or in a safe place.  Remove objects that might break or can hurt the person.  Make sure lighting is good inside and outside.  Put in grab bars if needed.  Use a device that detects falls or other  needs for help. Lessen confusion.  Keep familiar objects and people around.  Use night lights or low lit (dim) lights at night.  Label objects or areas.  Use reminders, notes, or directions for daily activities or tasks.  Keep a simple routine that is the same for waking, meals, bathing, dressing, and bedtime.  Create a calm and quiet home.  Put up clocks and calendars.  Keep emergency numbers and the home address near all phones.  Help show the different times of day. Open the curtains during the day to let light in. Speak clearly and directly.  Choose simple words and short sentences.  Use a gentle, calm voice.  Do not interrupt.  If the person has a hard time finding a word to use, give them the word or thought.  Ask 1 question at a time. Give enough time for the person to answer. Repeat the question if the person does not answer. Do things that lessen restlessness.  Provide a comfortable bed.  Have the same bedtime routine every night.  Have a regular walking and activity schedule.  Lessen naps during the day.  Do not let the person drink a lot of caffeine.  Go to events that are not overwhelming. Eat well and drink fluids.  Lessen distractions during meal times and snacks.  Avoid foods that are too hot or too cold.  Watch how the  person chews and swallows. This is to make sure they do not choke. Other  Keep all vision, hearing, dental, and medical visits with the doctor.  Only give medicines as told by the doctor.  Watch the person's driving ability. Do not let the person drive if he or she cannot drive safely.  Use a program that helps find a person if they become missing. You may need to register with this program. GET HELP RIGHT AWAY IF:   A fever of 102 F (38.9 C) develops.  Confusion develops or gets worse.  Sleepiness develops or gets worse.  Staying awake is hard to do.  New behavior problems start like mood swings, aggression, and  seeing things that are not there.  Problems with balance, speech, or falling develop.  Problems swallowing develop.  Any problems of another sickness develop. MAKE SURE YOU:  Understand these instructions.  Will watch his or her condition.  Will get help right away if he or she is not doing well or gets worse. Document Released: 02/10/2008 Document Revised: 05/22/2011 Document Reviewed: 07/25/2010 [facility] Patient Information 2015 [place], [place]. This information is not intended to replace advice given to you by your health care provider. Make sure you discuss any questions you have with your health care provider.

## 2013-12-25 NOTE — Progress Notes (Signed)
Patient here for annual wellness visit, patient reports: Home Safety: Pt lives at home, with two daughters in a 1 story home.  Pt reports having smoke alarms. Other Information: Corrective lens: Pt does not wear corrective lens, we were unable to obtain an eye exam. Pt reports very poor vision. Dentures: Pt does not have dentures. Does not have dental exams. Memory: Pt reports some memory problems.  Patient's MOCA score (recorded in doc. flowsheet): 4 Bladder:  Pt denies problems with bladder control.  ADL/IADL:  Pt reports dependence in all functions. Balance/Gait: Pt reports 0 falls in the past year.  We discussed home safety and fall prevention.  Pt is dependent on wheel chair, unable to perform Up and Go test.  Hearing:  We were unable to obtain hearing screening, pt said she didn't hear anything.  Vision:  Pt was unable to complete vision screening, not able to see the snellen chart.      Annual Wellness Visit Requirements Recorded Today In  Medical, family, social history Past Medical, Family, Social History Section  Current providers Care team  Current medications Medications  Wt, BP, Ht, BMI Vital signs  Visual acuity (welcome visit) Progress Note  Hearing assessment (welcome visit) Progress note  Tobacco, alcohol, illicit drug use History  ADL Nurse Assessment  Depression Screening Nurse Assessment  Cognitive impairment Document Flowsheet  Mini Mental Status Document Flowsheet  Fall Risk Fall/Depression  Home Safety Progress Note  End of Life Planning (welcome visit) Social Documentation  Medicare preventative services Health Maintenance  Risk factors/conditions needing evaluation/treatment Progress Note  Personalized health advice Patient Instructions, goals, letter  Diet & Exercise Social Documentation  Emergency Contact Social Documentation  Seat Belts Social Documentation  Sun exposure/protection Social Documentation

## 2013-12-25 NOTE — Progress Notes (Signed)
Subjective:    Patient ID: Yvette [name], female    DOB: 02/21/1917, 80 y.o.   MRN: [id number]  H[name]  Patient accompanied by her daughter. Interpreter Yvette [name] from [employer] present for entire office visit Both the patient's dgt and Ms Yvette [name] interpreted.  While patient was to be evaluated in by Dr Shadrach Bartunek in the [facility], she was scheduled in Dr Savon Bordonaro's continuity clinic.   An abbreviated Geriatric Assessment was completed.   History obtained from patient, dgt and EMR HPI  Patient denies pain, shortness of breath, chest discomfort.   Patient has seven children, all of whom are in [place] and most within a few hours of [place]. Two of the dgts take turns staying with their mother.    Primary Care Provider: Two dgts alternate staying with and caring for their mother.  History Chief Complaint  Patient presents with  . Follow-up  . Annual Exam    Medicare Initial Annual Wellness Visit      Outpatient Encounter Prescriptions as of 12/25/2013  Medication Sig  . aspirin EC 81 MG tablet Take 81 mg by mouth daily.  . diclofenac sodium (VOLTAREN) 1 % GEL Apply 2 g topically 4 (four) times daily.  . hydrALAZINE (APRESOLINE) 50 MG tablet Take 50 mg by mouth 4 (four) times daily.  . isosorbide mononitrate (IMDUR) 30 MG 24 hr tablet Take 15 mg by mouth daily.  . megestrol (MEGACE) 40 MG tablet Take 160 mg by mouth daily.  . Melatonin 5 MG TABS Take 10 mg by mouth at bedtime.   . Meth-Hyo-M Bl-Na Phos-Ph Sal (URIBEL) 118 MG CAPS Take 1 capsule by mouth 4 (four) times daily.  . Multiple Vitamin (MULTIVITAMIN WITH MINERALS) TABS tablet Take 1 tablet by mouth daily.  . nitroGLYCERIN (NITROSTAT) 0.4 MG SL tablet Place 1 tablet (0.4 mg total) under the tongue every 5 (five) minutes as needed for chest pain.  . pantoprazole (PROTONIX) 40 MG tablet Take 40 mg by mouth daily.  . pindolol (VISKEN) 5 MG tablet Take 2.5-5 mg by  mouth See admin instructions. Take 1 tablet in the morning and 0.5 tablet in the evening.  . torsemide (DEMADEX) 20 MG tablet Take 1 tablet (20 mg total) by mouth daily. Take on Tuesday, Thursday and Saturday at 6pm.   History Patient Active Problem List   Diagnosis Date Noted  . Cataracts, bilateral, very poor vision 12/25/2013  . Poor vision 12/25/2013  . Impaired mobility and ADLs 12/25/2013  . Hearing loss 12/25/2013  . Cerumen impaction 12/25/2013  . Chronic indwelling Foley catheter 11/10/2013  . Unstable angina, history of 10/04/2012  . History of ST elevation myocardial infarction (STEMI) 09/24/2012  . Vertebral fracture 07/28/2012  . CAD (coronary artery disease) 07/25/2012  . Urinary retention 07/25/2012  . Acute on chronic diastolic CHF (congestive heart failure), NYHA class 1 10/05/2011  . HTN (hypertension) 10/05/2011  . CKD (chronic kidney disease), stage III 04/26/2011  . Anxiety 05/05/2010  . Paroxysmal atrial flutter   . Chronic diastolic heart failure   . Sick sinus syndrome   . ATRIAL FLUTTER 05/02/2010  . BRADYCARDIA-TACHYCARDIA SYNDROME 05/02/2010   Past Medical History  Diagnosis Date  . [facility]ox[facility]    High fall risk-not a Coumadin candidate  . Hypertension   . Chronic diastolic heart failure     a. Echo February 2012: EF 55%; moderate LVH; mild AI; mild MR; PASP 39  .  Sick sinus syndrome     a. Limits use of beta blocker-pindolol tolerated  . CAD (coronary artery disease) PRESUMED    a. 04/2010 NSTEMI->med rx;  b. 09/2012 inf stemi->med rx.  . Arthritis   . Anxiety   . Hydronephrosis     Chronic  . Chronic back pain   . Stroke   . Herpes zoster 03/01/2012  . Myocardial infarction   . CHF (congestive heart failure)   . Chronic indwelling Foley catheter 11/10/2013  . Cataracts, bilateral, very poor vision 12/25/2013  . UTI (urinary tract infection) 10/04/2012  . Unstable angina 10/04/2012  . SOB (shortness of  breath) 10/05/2011  . Respiratory distress 11/10/2013  . Protein-calorie malnutrition, severe 11/11/2013  . Physical deconditioning 04/26/2011  . Loss of weight 08/29/2012  . Fatigue 10/04/2011  . Encounter for palliative care 07/24/2012  . Acute on chronic diastolic CHF (congestive heart failure), NYHA class 1 10/05/2011  . History of ST elevation myocardial infarction (STEMI) 09/24/2012   Past Surgical History  Procedure Laterality Date  . Esophagogastroduodenoscopy  04/19/2011    Procedure: ESOPHAGOGASTRODUODENOSCOPY (EGD);  Surgeon: Erick BlinksJay Pyrtle, MD;  [facility];  Service: Gastroenterology;  Laterality: N/A;   Family History  Problem Relation Age of Onset  . CAD Neg Hx     reports that she has never smoked. She has never used smokeless tobacco. She reports that she does not drink alcohol or use illicit drugs.  Basic Activities of Daily Living and Instrumental Activities of Daily Living Dependent in  eating, bathing, toileting, personal cares, ambulating, grooming, hygiene, dressing upper body, dressing lower body, meal preparation and taking own medications  Caregiver Burdens: total care of patient   Falls in the past six months:   no  Health Maintenance reviewed: Immunization History  Administered Date(s) Administered  . Influenza Whole 01/11/2011  . Influenza,inj,Quad PF,36+ Mos 12/25/2013  . Pneumococcal Conjugate-13 04/25/2008  . Tdap 04/26/2007   Health Maintenance Topics with due status: Overdue     Topic Date Due   ZOSTAVAX 03/13/1978   INFLUENZA VACCINE 10/11/2013    Diet: Regular  Nutritional supplements: none  ROS Obtained from patient and Dgt Denies chest pain;  Denies constipation; denies melena denies diarrhea;  Denies  N/V;  Denies recent falls Denies history of stroke, unilateral weakness   denies tremors;  Denies prolonged sadness, (+) enjoys when relatives visit. Enjoys listening to music.   Vital Signs Weight: 95 lb (43.092 kg) Body mass  index is 22.08 kg/(m^2). The CrCl is unknown because both a height and weight (above a minimum accepted value) are required for this calculation. There is no height on file to calculate BSA. Filed Vitals:   12/25/13 1012  BP: 153/80  Pulse: 85  Temp: 98.3 F (36.8 C)  TempSrc: Oral  Weight: 95 lb (43.092 kg)  SpO2: 96%   Wt Readings from Last 3 Encounters:  12/25/13 95 lb (43.092 kg)  11/18/13 95 lb 4.8 oz (43.228 kg)  11/12/13 88 lb 2.9 oz (40 kg)    Physical Examination:  VS reviewed GEN: Alert, Cooperative, Groomed, NAD HEENT: PERRL; Left EAC occluded, Right TM's translucent with normal LM, (+) LR; No cervical LAN, No thyromegaly, No palpable masses COR: RRR LUNGS: BCTA, No Acc mm use, ABDOMEN: (+)BS, soft, NT, ND,  EXT: No peripheral leg edema. Feet without deformity or lesions. Palpable bilateral dorsal pedal pulses. Dry skin of feet.  Neuro: Strength: 5/5 Left UE and LE  4/5 right UE  Strength: 5/5 Left LE and 5/5 right LE Muscle Tone normal; Tremor not present  Timed Up & Go Test:Unable to perform as patient has been WC bound for years  PHQ-2 = 0 Montreal Cognitive Assessment Scale (In [place] limited by very impaired vision and possible hearing impairment score of 4.     Labs  No results found for this basename: VITAMINB12    No results found for this basename: FOLATE    Lab Results  Component Value Date   TSH 1.690 11/10/2013    No results found for this basename: RPR      Chemistry      Component Value Date/Time   NA 136 11/18/2013 1443   K 4.9 11/18/2013 1443   CL 109 11/18/2013 1443   CO2 22 11/18/2013 1443   BUN 36* 11/18/2013 1443   CREATININE 1.32* 11/18/2013 1443   CREATININE 1.88* 11/12/2013 0732      Component Value Date/Time   CALCIUM 8.6 11/18/2013 1443   ALKPHOS 52 11/10/2013 0641   AST 21 11/10/2013 0641   ALT 15 11/10/2013 0641   BILITOT 0.3 11/10/2013 0641       Lab Results  Component Value Date   HGBA1C 6.1* 09/24/2012       Lab Results  Component Value Date   WBC 7.9 11/10/2013   HGB 11.4* 11/10/2013   HCT 35.5* 11/10/2013   MCV 95.7 11/10/2013   PLT 234 11/10/2013    No results found for this or any previous visit (from the past 24 hour(s)).  Assessment and Plan: Problem List Items Addressed This Visit     Unprioritized   Poor vision - Bilateral cataracts that patient has declined to have surgical intervention   Impaired mobility and ADLs - Dependent in all ADLs including eating   Hearing loss - Likely sensorineural hearing loss though patient did have left EAC cerumen impaction that may be a conductive hearing loss contributing cause.    Dementia - FAST Stage: Stage 7 with preservation of language Palliative Performance Score: 40%.  Behavioral and Psychological Symptoms of Dementia: none except Circadian sleep rhythm changes of dementia with advanced bedtime and fragmented sleep pattern.  Nonpharmacologic treatments of patient: Megace. American Geriatric Society discourages the use of appetite stimulants as it is associated with volume overload in CHF, increase risk of Venous Thromboembolism and adrenal insufficiency with only a modest increase in fat tissue, not muscle and no improvement in QOL or length of life.  Supportive Interventions for caregiver (support group or individual counseling): Caretakers appear to have a working system to provide care to their mother.  While this may be causing some stress on the dgt, she seems hesitant to ask for help. Would recommend that dgt consider attending an Alzheimer association support group.   Would recommend increase sunlight exposure in late afternoon to delay onset of early sleep.      Cerumen impaction, left EAC - unable to disimpact in office b/c patient intolerant of irrigation with Elephant irrigator.  - Recommend dgt use mineral oil drops  in left ear canal twice a week to soften cerumen, allowing it to flow out of EAC slowly.    Cataracts,  bilateral, very poor vision - I suspect that patient has very poor eye sight based on her difficulty seeing the MoCa sheet as more Ted a white blur.     Other Visit Diagnoses   Encounter for immunization    -  Primary    Medicare annual wellness visit, initial  Advanced Directives: Code Status:  Full code   Montreal Cognitive Assessment  12/25/2013  Visuospatial/ Executive (0/5) 0  Naming (0/3) 0  Attention: Read list of digits (0/2) 1  Attention: Read list of letters (0/1) 0  Attention: Serial 7 subtraction starting at 100 (0/3) 0  Language: Repeat phrase (0/2) 2  Language : Fluency (0/1) 0  Abstraction (0/2) 0  Delayed Recall (0/5) 0  Orientation (0/6) 0  Total 3  Adjusted Score (based on education) 4  Contact: First and Last Name if other [place] the patient involved: Yvette [name] (Dgt) [phone] (H) (M) or [phone] (W)  [phone] (home)    Patient to Follow up with Dr. Burnis Medin  in 3 month(s)    Review of Systems     Objective:   Physical Exam        Assessment & Plan:

## 2014-01-01 ENCOUNTER — Ambulatory Visit (INDEPENDENT_AMBULATORY_CARE_PROVIDER_SITE_OTHER): Payer: Medicaid Other | Admitting: Cardiology

## 2014-01-01 ENCOUNTER — Encounter: Payer: Self-pay | Admitting: Cardiology

## 2014-01-01 VITALS — BP 160/75 | HR 85 | Ht <= 58 in | Wt 96.0 lb

## 2014-01-01 DIAGNOSIS — N183 Chronic kidney disease, stage 3 unspecified: Secondary | ICD-10-CM

## 2014-01-01 DIAGNOSIS — I4892 Unspecified atrial flutter: Secondary | ICD-10-CM

## 2014-01-01 DIAGNOSIS — I10 Essential (primary) hypertension: Secondary | ICD-10-CM

## 2014-01-01 DIAGNOSIS — I5033 Acute on chronic diastolic (congestive) heart failure: Secondary | ICD-10-CM

## 2014-01-01 DIAGNOSIS — I495 Sick sinus syndrome: Secondary | ICD-10-CM

## 2014-01-01 NOTE — Progress Notes (Signed)
01/01/2014 Yvette [name]   12/05/1916  [id number]  Primary Physicia Street, Cristal Deerhristopher, MD Primary Cardiologist: Dr Antoine PocheHochrein  HPI:  80 y/o Asian female with a history of chronic diastolic CHF, CAD with a history of STEMI in 2012- medical Rx., chronic renal insufficiency, PAF with SSS, and HTN. She was recently admitted 11/10/13 with acute on chronic diastolic CHF with respiratory failure. She was treated with diuretics and Bipap. Palliative Care consult was declined by the family. Echo showed her EF to be 65-70%. She is here today for post hospital follow up. She was seen with her daughter and an interpreter. They say the pt has been stable at home. She is significantly debilitated, in a wheelchair.    Current Outpatient Prescriptions  Medication Sig Dispense Refill  . aspirin EC 81 MG tablet Take 81 mg by mouth daily.      . diclofenac sodium (VOLTAREN) 1 % GEL Apply 2 g topically 4 (four) times daily.  1 Tube  5  . hydrALAZINE (APRESOLINE) 50 MG tablet Take 50 mg by mouth 4 (four) times daily.      . isosorbide mononitrate (IMDUR) 30 MG 24 hr tablet Take 15 mg by mouth daily.      . megestrol (MEGACE) 40 MG tablet Take 160 mg by mouth daily.      . Melatonin 5 MG TABS Take 10 mg by mouth at bedtime.       . Meth-Hyo-M Bl-Na Phos-Ph Sal (URIBEL) 118 MG CAPS Take 1 capsule by mouth 4 (four) times daily.      . Multiple Vitamin (MULTIVITAMIN WITH MINERALS) TABS tablet Take 1 tablet by mouth daily.      . nitroGLYCERIN (NITROSTAT) 0.4 MG SL tablet Place 1 tablet (0.4 mg total) under the tongue every 5 (five) minutes as needed for chest pain.  25 tablet  3  . pantoprazole (PROTONIX) 40 MG tablet Take 40 mg by mouth daily.      . pindolol (VISKEN) 5 MG tablet Take 2.5-5 mg by mouth See admin instructions. Take 1 tablet in the morning and 0.5 tablet in the evening.      . torsemide (DEMADEX) 20 MG tablet Take 1 tablet (20 mg total) by mouth daily. Take on Tuesday, Thursday and Saturday at 6pm.   30 tablet  0   No current facility-administered medications for this visit.    No Known Allergies  History   Social History  . Marital Status: Widowed    Spouse Name: N/A    Number of Children: 7  . Years of Education: 9   Occupational History  . Retired    Social History Main Topics  . Smoking status: Never Smoker   . Smokeless tobacco: Never Used  . Alcohol Use: No  . Drug Use: No  . Sexual Activity: No   Other Topics Concern  . Not on file   Social History Narrative   Pt lives with family. She has 24 hour care.     Review of Systems: General: negative for chills, fever, night sweats or weight changes.  Cardiovascular: negative for chest pain, dyspnea on exertion, edema, orthopnea, palpitations, paroxysmal nocturnal dyspnea or shortness of breath Dermatological: negative for rash Respiratory: negative for cough or wheezing Urologic: negative for hematuria Abdominal: negative for nausea, vomiting, diarrhea, bright red blood per rectum, melena, or hematemesis Neurologic: negative for visual changes, syncope, or dizziness All other systems reviewed and are otherwise negative except as noted above.    Blood pressure 160/75,  pulse 85, height 4\' 7"  (1.397 m), weight 96 lb (43.545 kg).  General appearance: alert, cooperative, appears stated age, no distress and pale Lungs: decreased at bases Heart: regular rate and rhythm Extremities: no edema   ASSESSMENT AND PLAN:   Acute on chronic diastolic CHF (congestive heart failure), NYHA class 1 Seen post hospital, currently stable.   Paroxysmal atrial flutter Holding NSR. High fall risk-not a Coumadin candidate  Sick sinus syndrome Limits use of beta blocker-pindolol tolerated  HTN (hypertension) Controlled  CKD (chronic kidney disease), stage III SCr 1.32 on 11/18/13   PLAN  I did not change her medications. She can F/U with Dr Antoine PocheHochrein in 3 months. She should be NCB.  Yvette [name] KPA-C 01/01/2014 4:50  PM

## 2014-01-01 NOTE — Assessment & Plan Note (Signed)
Holding NSR. High fall risk-not a Coumadin candidate

## 2014-01-01 NOTE — Assessment & Plan Note (Signed)
Seen post hospital, currently stable.

## 2014-01-01 NOTE — Assessment & Plan Note (Signed)
SCr 1.32 on 11/18/13

## 2014-01-01 NOTE — Assessment & Plan Note (Signed)
Limits use of beta blocker-pindolol tolerated

## 2014-01-01 NOTE — Patient Instructions (Signed)
Your physician recommends that you schedule a follow-up appointment in: 3 Months with Dr [place] LionsHochrien

## 2014-01-01 NOTE — Assessment & Plan Note (Signed)
Controlled.  

## 2014-01-05 ENCOUNTER — Emergency Department (HOSPITAL_COMMUNITY): Payer: Medicaid Other

## 2014-01-05 ENCOUNTER — Inpatient Hospital Stay (HOSPITAL_COMMUNITY)
Admission: EM | Admit: 2014-01-05 | Discharge: 2014-01-09 | DRG: 291 | Disposition: A | Discharge status: Home / Self Care | Payer: Medicaid Other | Attending: Family Medicine | Admitting: Family Medicine

## 2014-01-05 ENCOUNTER — Encounter (HOSPITAL_COMMUNITY): Payer: Self-pay | Admitting: Emergency Medicine

## 2014-01-05 DIAGNOSIS — I248 Other forms of acute ischemic heart disease: Secondary | ICD-10-CM | POA: Diagnosis present

## 2014-01-05 DIAGNOSIS — I4892 Unspecified atrial flutter: Secondary | ICD-10-CM | POA: Diagnosis present

## 2014-01-05 DIAGNOSIS — R0689 Other abnormalities of breathing: Secondary | ICD-10-CM

## 2014-01-05 DIAGNOSIS — I130 Hypertensive heart and chronic kidney disease with heart failure and stage 1 through stage 4 chronic kidney disease, or unspecified chronic kidney disease: Secondary | ICD-10-CM | POA: Diagnosis present

## 2014-01-05 DIAGNOSIS — I255 Ischemic cardiomyopathy: Secondary | ICD-10-CM | POA: Diagnosis present

## 2014-01-05 DIAGNOSIS — I495 Sick sinus syndrome: Secondary | ICD-10-CM | POA: Diagnosis present

## 2014-01-05 DIAGNOSIS — N183 Chronic kidney disease, stage 3 unspecified: Secondary | ICD-10-CM

## 2014-01-05 DIAGNOSIS — Z8673 Personal history of transient ischemic attack (TIA), and cerebral infarction without residual deficits: Secondary | ICD-10-CM | POA: Diagnosis not present

## 2014-01-05 DIAGNOSIS — I10 Essential (primary) hypertension: Secondary | ICD-10-CM | POA: Diagnosis present

## 2014-01-05 DIAGNOSIS — M8580 Other specified disorders of bone density and structure, unspecified site: Secondary | ICD-10-CM | POA: Diagnosis present

## 2014-01-05 DIAGNOSIS — J189 Pneumonia, unspecified organism: Secondary | ICD-10-CM | POA: Diagnosis present

## 2014-01-05 DIAGNOSIS — F0390 Unspecified dementia without behavioral disturbance: Secondary | ICD-10-CM | POA: Diagnosis present

## 2014-01-05 DIAGNOSIS — I480 Paroxysmal atrial fibrillation: Secondary | ICD-10-CM | POA: Diagnosis present

## 2014-01-05 DIAGNOSIS — I5033 Acute on chronic diastolic (congestive) heart failure: Secondary | ICD-10-CM | POA: Diagnosis present

## 2014-01-05 DIAGNOSIS — R1084 Generalized abdominal pain: Secondary | ICD-10-CM

## 2014-01-05 DIAGNOSIS — J9601 Acute respiratory failure with hypoxia: Secondary | ICD-10-CM | POA: Diagnosis present

## 2014-01-05 DIAGNOSIS — I5032 Chronic diastolic (congestive) heart failure: Secondary | ICD-10-CM | POA: Diagnosis present

## 2014-01-05 DIAGNOSIS — M549 Dorsalgia, unspecified: Secondary | ICD-10-CM | POA: Diagnosis present

## 2014-01-05 DIAGNOSIS — I252 Old myocardial infarction: Secondary | ICD-10-CM | POA: Diagnosis not present

## 2014-01-05 DIAGNOSIS — N184 Chronic kidney disease, stage 4 (severe): Secondary | ICD-10-CM | POA: Diagnosis present

## 2014-01-05 DIAGNOSIS — R0902 Hypoxemia: Secondary | ICD-10-CM

## 2014-01-05 DIAGNOSIS — Y95 Nosocomial condition: Secondary | ICD-10-CM | POA: Diagnosis present

## 2014-01-05 DIAGNOSIS — Z978 Presence of other specified devices: Secondary | ICD-10-CM

## 2014-01-05 DIAGNOSIS — R7989 Other specified abnormal findings of blood chemistry: Secondary | ICD-10-CM

## 2014-01-05 DIAGNOSIS — J810 Acute pulmonary edema: Secondary | ICD-10-CM | POA: Diagnosis present

## 2014-01-05 DIAGNOSIS — Z7982 Long term (current) use of aspirin: Secondary | ICD-10-CM | POA: Diagnosis not present

## 2014-01-05 DIAGNOSIS — E872 Acidosis: Secondary | ICD-10-CM | POA: Diagnosis present

## 2014-01-05 DIAGNOSIS — R339 Retention of urine, unspecified: Secondary | ICD-10-CM | POA: Diagnosis present

## 2014-01-05 DIAGNOSIS — Z960 Presence of urogenital implants: Secondary | ICD-10-CM

## 2014-01-05 DIAGNOSIS — I5030 Unspecified diastolic (congestive) heart failure: Secondary | ICD-10-CM

## 2014-01-05 DIAGNOSIS — I25110 Atherosclerotic heart disease of native coronary artery with unstable angina pectoris: Secondary | ICD-10-CM

## 2014-01-05 DIAGNOSIS — Z7409 Other reduced mobility: Secondary | ICD-10-CM | POA: Diagnosis present

## 2014-01-05 DIAGNOSIS — G8929 Other chronic pain: Secondary | ICD-10-CM | POA: Diagnosis present

## 2014-01-05 DIAGNOSIS — I2584 Coronary atherosclerosis due to calcified coronary lesion: Secondary | ICD-10-CM

## 2014-01-05 DIAGNOSIS — R109 Unspecified abdominal pain: Secondary | ICD-10-CM

## 2014-01-05 DIAGNOSIS — I2510 Atherosclerotic heart disease of native coronary artery without angina pectoris: Secondary | ICD-10-CM | POA: Diagnosis present

## 2014-01-05 DIAGNOSIS — J9600 Acute respiratory failure, unspecified whether with hypoxia or hypercapnia: Secondary | ICD-10-CM | POA: Diagnosis present

## 2014-01-05 DIAGNOSIS — E869 Volume depletion, unspecified: Secondary | ICD-10-CM | POA: Diagnosis present

## 2014-01-05 DIAGNOSIS — R1011 Right upper quadrant pain: Secondary | ICD-10-CM

## 2014-01-05 DIAGNOSIS — R778 Other specified abnormalities of plasma proteins: Secondary | ICD-10-CM | POA: Diagnosis present

## 2014-01-05 LAB — URINE MICROSCOPIC-ADD ON

## 2014-01-05 LAB — I-STAT CHEM 8, ED
BUN: 43 mg/dL — ABNORMAL HIGH (ref 6–23)
CREATININE: 1.7 mg/dL — AB (ref 0.50–1.10)
Calcium, Ion: 1.15 mmol/L (ref 1.13–1.30)
Chloride: 113 mEq/L — ABNORMAL HIGH (ref 96–112)
GLUCOSE: 163 mg/dL — AB (ref 70–99)
HCT: 43 % (ref 36.0–46.0)
Hemoglobin: 14.6 g/dL (ref 12.0–15.0)
Potassium: 5.3 mEq/L (ref 3.7–5.3)
Sodium: 143 mEq/L (ref 137–147)
TCO2: 18 mmol/L (ref 0–100)

## 2014-01-05 LAB — PROTIME-INR
INR: 1.07 (ref 0.00–1.49)
PROTHROMBIN TIME: 14.1 s (ref 11.6–15.2)

## 2014-01-05 LAB — COMPREHENSIVE METABOLIC PANEL
ALK PHOS: 64 U/L (ref 39–117)
ALT: 18 U/L (ref 0–35)
AST: 32 U/L (ref 0–37)
Albumin: 3.2 g/dL — ABNORMAL LOW (ref 3.5–5.2)
Anion gap: 18 — ABNORMAL HIGH (ref 5–15)
BUN: 39 mg/dL — ABNORMAL HIGH (ref 6–23)
CO2: 17 mEq/L — ABNORMAL LOW (ref 19–32)
Calcium: 9.2 mg/dL (ref 8.4–10.5)
Chloride: 106 mEq/L (ref 96–112)
Creatinine, Ser: 1.6 mg/dL — ABNORMAL HIGH (ref 0.50–1.10)
GFR calc non Af Amer: 26 mL/min — ABNORMAL LOW (ref >90)
GFR, EST AFRICAN AMERICAN: 30 mL/min — AB (ref >90)
GLUCOSE: 162 mg/dL — AB (ref 70–99)
POTASSIUM: 5.6 meq/L — AB (ref 3.7–5.3)
SODIUM: 141 meq/L (ref 137–147)
TOTAL PROTEIN: 9 g/dL — AB (ref 6.0–8.3)
Total Bilirubin: 0.4 mg/dL (ref 0.3–1.2)

## 2014-01-05 LAB — URINALYSIS, ROUTINE W REFLEX MICROSCOPIC
BILIRUBIN URINE: NEGATIVE
Glucose, UA: NEGATIVE mg/dL
Hgb urine dipstick: NEGATIVE
Ketones, ur: NEGATIVE mg/dL
NITRITE: NEGATIVE
PH: 6 (ref 5.0–8.0)
Protein, ur: 30 mg/dL — AB
SPECIFIC GRAVITY, URINE: 1.015 (ref 1.005–1.030)
UROBILINOGEN UA: 0.2 mg/dL (ref 0.0–1.0)

## 2014-01-05 LAB — CBC WITH DIFFERENTIAL/PLATELET
BASOS ABS: 0 10*3/uL (ref 0.0–0.1)
Basophils Relative: 0 % (ref 0–1)
Eosinophils Absolute: 0.2 10*3/uL (ref 0.0–0.7)
Eosinophils Relative: 2 % (ref 0–5)
HEMATOCRIT: 41 % (ref 36.0–46.0)
HEMOGLOBIN: 11.1 g/dL — AB (ref 12.0–15.0)
LYMPHS PCT: 15 % (ref 12–46)
Lymphs Abs: 1.7 10*3/uL (ref 0.7–4.0)
MCH: 26.2 pg (ref 26.0–34.0)
MCHC: 27.1 g/dL — ABNORMAL LOW (ref 30.0–36.0)
MCV: 96.9 fL (ref 78.0–100.0)
Monocytes Absolute: 0.5 10*3/uL (ref 0.1–1.0)
Monocytes Relative: 5 % (ref 3–12)
NEUTROS ABS: 8.6 10*3/uL — AB (ref 1.7–7.7)
NEUTROS PCT: 78 % — AB (ref 43–77)
PLATELETS: 342 10*3/uL (ref 150–400)
RBC: 4.23 MIL/uL (ref 3.87–5.11)
RDW: 13.5 % (ref 11.5–15.5)
WBC: 11.1 10*3/uL — ABNORMAL HIGH (ref 4.0–10.5)

## 2014-01-05 LAB — TROPONIN I
TROPONIN I: 1.49 ng/mL — AB (ref <0.30)
TROPONIN I: 1.71 ng/mL — AB (ref <0.30)
Troponin I: 1.56 ng/mL (ref <0.30)
Troponin I: 1.62 ng/mL (ref <0.30)

## 2014-01-05 LAB — I-STAT TROPONIN, ED: Troponin i, poc: 1.27 ng/mL (ref 0.00–0.08)

## 2014-01-05 LAB — LACTIC ACID, PLASMA: Lactic Acid, Venous: 1.4 mmol/L (ref 0.5–2.2)

## 2014-01-05 LAB — PRO B NATRIURETIC PEPTIDE: PRO B NATRI PEPTIDE: 9191 pg/mL — AB (ref 0–450)

## 2014-01-05 LAB — LIPASE, BLOOD: Lipase: 118 U/L — ABNORMAL HIGH (ref 11–59)

## 2014-01-05 LAB — I-STAT CG4 LACTIC ACID, ED: Lactic Acid, Venous: 5.03 mmol/L — ABNORMAL HIGH (ref 0.5–2.2)

## 2014-01-05 MED ORDER — TRAZODONE 25 MG HALF TABLET
25.0000 mg | ORAL_TABLET | Freq: Once | ORAL | Status: AC
  Administered 2014-01-05: 25 mg via ORAL
  Filled 2014-01-05: qty 1

## 2014-01-05 MED ORDER — FENTANYL CITRATE 0.05 MG/ML IJ SOLN
100.0000 ug | Freq: Once | INTRAMUSCULAR | Status: DC
  Filled 2014-01-05: qty 2

## 2014-01-05 MED ORDER — PINDOLOL 5 MG PO TABS
5.0000 mg | ORAL_TABLET | Freq: Every day | ORAL | Status: DC
  Administered 2014-01-06 – 2014-01-09 (×4): 5 mg via ORAL
  Filled 2014-01-05 (×4): qty 1

## 2014-01-05 MED ORDER — NITROGLYCERIN IN D5W 200-5 MCG/ML-% IV SOLN
50.0000 ug/min | INTRAVENOUS | Status: DC
  Administered 2014-01-05: 50 ug/min via INTRAVENOUS
  Filled 2014-01-05: qty 250

## 2014-01-05 MED ORDER — LEVOFLOXACIN 750 MG PO TABS: 750.0000 mg | ORAL_TABLET | ORAL | Status: DC

## 2014-01-05 MED ORDER — ISOSORBIDE MONONITRATE 15 MG HALF TABLET
15.0000 mg | ORAL_TABLET | Freq: Every day | ORAL | Status: DC
  Administered 2014-01-06 – 2014-01-09 (×4): 15 mg via ORAL
  Filled 2014-01-05 (×5): qty 1

## 2014-01-05 MED ORDER — ASPIRIN EC 81 MG PO TBEC
81.0000 mg | DELAYED_RELEASE_TABLET | Freq: Every day | ORAL | Status: DC
  Administered 2014-01-06 – 2014-01-09 (×4): 81 mg via ORAL
  Filled 2014-01-05 (×4): qty 1

## 2014-01-05 MED ORDER — DEXTROSE 5 % IV SOLN
1.0000 g | Freq: Once | INTRAVENOUS | Status: AC
  Administered 2014-01-05: 1 g via INTRAVENOUS
  Filled 2014-01-05: qty 10

## 2014-01-05 MED ORDER — URIBEL 118 MG PO CAPS: 1.0000 | ORAL_CAPSULE | Freq: Four times a day (QID) | ORAL | Status: DC

## 2014-01-05 MED ORDER — SODIUM CHLORIDE 0.9 % IJ SOLN: 3.0000 mL | INTRAMUSCULAR | Status: DC | PRN

## 2014-01-05 MED ORDER — ACETAMINOPHEN 325 MG PO TABS: 650.0000 mg | ORAL_TABLET | ORAL | Status: DC | PRN

## 2014-01-05 MED ORDER — SODIUM CHLORIDE 0.9 % IV SOLN: 250.0000 mL | INTRAVENOUS | Status: DC | PRN

## 2014-01-05 MED ORDER — FUROSEMIDE 10 MG/ML IJ SOLN
40.0000 mg | Freq: Two times a day (BID) | INTRAMUSCULAR | Status: DC
  Administered 2014-01-05 – 2014-01-07 (×6): 40 mg via INTRAVENOUS
  Filled 2014-01-05 (×8): qty 4

## 2014-01-05 MED ORDER — SODIUM CHLORIDE 0.9 % IJ SOLN
3.0000 mL | Freq: Two times a day (BID) | INTRAMUSCULAR | Status: DC
  Administered 2014-01-05: 3 mL via INTRAVENOUS
  Administered 2014-01-05: 10:00:00 via INTRAVENOUS
  Administered 2014-01-06 – 2014-01-09 (×7): 3 mL via INTRAVENOUS

## 2014-01-05 MED ORDER — SODIUM CHLORIDE 0.9 % IV BOLUS (SEPSIS)
1000.0000 mL | Freq: Once | INTRAVENOUS | Status: DC
Start: 2014-01-05 — End: 2014-01-05

## 2014-01-05 MED ORDER — PINDOLOL 5 MG PO TABS
2.5000 mg | ORAL_TABLET | Freq: Every evening | ORAL | Status: DC
  Administered 2014-01-05 – 2014-01-08 (×4): 2.5 mg via ORAL
  Filled 2014-01-05 (×5): qty 1

## 2014-01-05 MED ORDER — HYDRALAZINE HCL 50 MG PO TABS
50.0000 mg | ORAL_TABLET | Freq: Four times a day (QID) | ORAL | Status: DC
  Administered 2014-01-05 – 2014-01-09 (×17): 50 mg via ORAL
  Filled 2014-01-05 (×20): qty 1

## 2014-01-05 MED ORDER — NITROGLYCERIN 0.4 MG SL SUBL: 0.4000 mg | SUBLINGUAL_TABLET | SUBLINGUAL | Status: DC | PRN

## 2014-01-05 MED ORDER — HEPARIN SODIUM (PORCINE) 5000 UNIT/ML IJ SOLN
5000.0000 [IU] | Freq: Three times a day (TID) | INTRAMUSCULAR | Status: DC
  Administered 2014-01-05: 5000 [IU] via SUBCUTANEOUS
  Filled 2014-01-05: qty 1

## 2014-01-05 MED ORDER — SODIUM CHLORIDE 0.9 % IV BOLUS (SEPSIS)
500.0000 mL | Freq: Once | INTRAVENOUS | Status: AC
  Administered 2014-01-05: 500 mL via INTRAVENOUS

## 2014-01-05 MED ORDER — ASPIRIN EC 325 MG PO TBEC: 325.0000 mg | DELAYED_RELEASE_TABLET | Freq: Once | ORAL | Status: DC

## 2014-01-05 MED ORDER — LEVOFLOXACIN 750 MG PO TABS
750.0000 mg | ORAL_TABLET | ORAL | Status: AC
Start: 2014-01-05 — End: 2014-01-05
  Administered 2014-01-05: 750 mg via ORAL
  Filled 2014-01-05: qty 1

## 2014-01-05 MED ORDER — ONDANSETRON HCL 4 MG/2ML IJ SOLN: 4.0000 mg | Freq: Four times a day (QID) | INTRAMUSCULAR | Status: DC | PRN

## 2014-01-05 MED ORDER — ALBUTEROL SULFATE (2.5 MG/3ML) 0.083% IN NEBU
10.0000 mg | INHALATION_SOLUTION | Freq: Once | RESPIRATORY_TRACT | Status: AC
  Administered 2014-01-05: 10 mg via RESPIRATORY_TRACT
  Filled 2014-01-05: qty 12

## 2014-01-05 MED ORDER — LEVOFLOXACIN 500 MG PO TABS
500.0000 mg | ORAL_TABLET | ORAL | Status: DC
  Administered 2014-01-07: 500 mg via ORAL
  Filled 2014-01-05 (×2): qty 1

## 2014-01-05 MED ORDER — URELLE 81 MG PO TABS
1.0000 | ORAL_TABLET | Freq: Four times a day (QID) | ORAL | Status: DC
  Administered 2014-01-05 – 2014-01-07 (×6): 81 mg via ORAL
  Filled 2014-01-05 (×9): qty 1

## 2014-01-05 MED ORDER — ENOXAPARIN SODIUM 40 MG/0.4ML ~~LOC~~ SOLN
40.0000 mg | SUBCUTANEOUS | Status: DC
  Administered 2014-01-05 – 2014-01-06 (×2): 40 mg via SUBCUTANEOUS
  Filled 2014-01-05 (×2): qty 0.4

## 2014-01-05 NOTE — ED Notes (Signed)
Spoke with Dr. Karie Schwalbe will keep bed as stepdown, because of elevated troponin's.

## 2014-01-05 NOTE — ED Notes (Signed)
Paged MD to change bed request. Given update on patient status. They state they will change bed to telemetry.

## 2014-01-05 NOTE — Progress Notes (Signed)
Addendum to RN note 10/26 @1225 , Charlton AmorNicole Kohut: I spoke with this RN and communicated that I will pass the message she documented along to the day team (pt was admitted by night cross-cover resident last night, Dr. Adriana Simasook; I was cross-cover and have not evaluated this patient) as they are assuming care for the admission. I communicated this with Dr. Benjamin Stainhekkekandam, who will be deciding on if / when to change orders, so any orders can be changed as appropriate. I do not know if the bed request will be changed or not and did not state that I would change this order. I stated that I would make sure things are taken care of.  Bobbye Mortonhristopher M Ruthann Angulo, MD PGY-3, [facility] 01/05/2014, 12:28 PM In-patient team pager: [phone]

## 2014-01-05 NOTE — ED Notes (Signed)
Pt removed from bipap for trial. Currently with sats 97% on 5L [place]. Will page admitting MD.

## 2014-01-05 NOTE — Progress Notes (Signed)
Family Medicine PGY-3 PCP Note  Pt seen at bedside in the ED; significant language barrier present and pt was on BiPAP at the time, so I did not speak directly with her. Spoke briefly with daughter and helped clarify some of what had been discussed with her by ED providers and Dr. Adriana Simasook; daughter voiced no additional needs at the time.  I appreciate the excellent care provided by the inpatient FPTS and all consultants as well as all nursing and other ancillary staff on behalf of my patient. Please do not hesitate to contact me if I can be of any help.  Bobbye Mortonhristopher M Lexy Meininger, MD PGY-3, [facility] 01/05/2014, 9:51 AM First call: FPTS Service pager: [phone] (text pages welcome through [facility]) Personal pager: [phone]

## 2014-01-05 NOTE — ED Provider Notes (Addendum)
CSN: [id number]     Arrival date & time 01/05/14  0405 History   First MD Initiated Contact with Patient 01/05/14 [id number]     Chief Complaint  Patient presents with  . Abdominal Pain     (Consider location/radiation/quality/duration/timing/severity/associated sxs/prior Treatment) HPI Yvette [name] is a 80 y.o. female with past medical history of CHF, EF 55%, coronary artery disease, chronic kidney disease coming in with shortness of breath abdominal pain. Recent is unable to give history due to language barrier and acuity of condition. Per the daughter in the room she woke up in the middle the night with severe abdominal pain and shortness of breath. Daughter denies any chest pain at that time. EMS gave albuterol treatment, oxygen saturation on room air was as low as 50%. She immediately rose to 100% with breathing treatment.  Daughter denies this ever happening to the patient in the past. She states that the Foley was supposed to be replaced 2 days ago.   Past Medical History  Diagnosis Date  . [facility]    High fall risk-not a Coumadin candidate  . Hypertension   . Chronic diastolic heart failure     a. Echo February 2012: EF 55%; moderate LVH; mild AI; mild MR; PASP 39  . Sick sinus syndrome     a. Limits use of beta blocker-pindolol tolerated  . CAD (coronary artery disease) PRESUMED    a. 04/2010 NSTEMI->med rx;  b. 09/2012 inf stemi->med rx.  . Arthritis   . Anxiety   . Hydronephrosis     Chronic  . Chronic back pain   . Stroke   . Herpes zoster 03/01/2012  . Myocardial infarction   . CHF (congestive heart failure)   . Chronic indwelling Foley catheter 11/10/2013  . Cataracts, bilateral, very poor vision 12/25/2013  . UTI (urinary tract infection) 10/04/2012  . Unstable angina 10/04/2012  . SOB (shortness of breath) 10/05/2011  . Respiratory distress 11/10/2013  . Protein-calorie malnutrition, severe 11/11/2013  . Physical deconditioning  04/26/2011  . Loss of weight 08/29/2012  . Fatigue 10/04/2011  . Encounter for palliative care 07/24/2012  . Acute on chronic diastolic CHF (congestive heart failure), NYHA class 1 10/05/2011  . History of ST elevation myocardial infarction (STEMI) 09/24/2012   Past Surgical History  Procedure Laterality Date  . Esophagogastroduodenoscopy  04/19/2011    Procedure: ESOPHAGOGASTRODUODENOSCOPY (EGD);  Surgeon: Erick BlinksJay Pyrtle, MD;  [facility];  Service: Gastroenterology;  Laterality: N/A;   Family History  Problem Relation Age of Onset  . CAD Neg Hx    History  Substance Use Topics  . Smoking status: Never Smoker   . Smokeless tobacco: Never Used  . Alcohol Use: No   OB History   Grav Para Term Preterm Abortions TAB SAB Ect Mult Living                 Review of Systems    Allergies  Review of patient's allergies indicates no known allergies.  Home Medications   Prior to Admission medications   Medication Sig Start Date End Date Taking? Authorizing Provider  aspirin EC 81 MG tablet Take 81 mg by mouth daily.    Historical Provider, MD  diclofenac sodium (VOLTAREN) 1 % GEL Apply 2 g topically 4 (four) times daily. 10/03/13   Stephanie Couphristopher M Street, MD  hydrALAZINE (APRESOLINE) 50 MG tablet Take 50 mg by mouth 4 (four) times daily.    Historical Provider, MD  isosorbide mononitrate (IMDUR)  30 MG 24 hr tablet Take 15 mg by mouth daily.    Historical Provider, MD  megestrol (MEGACE) 40 MG tablet Take 160 mg by mouth daily.    Historical Provider, MD  Melatonin 5 MG TABS Take 10 mg by mouth at bedtime.     Historical Provider, MD  Meth-Hyo-M Bl-Na Phos-Ph Sal (URIBEL) 118 MG CAPS Take 1 capsule by mouth 4 (four) times daily.    Historical Provider, MD  Multiple Vitamin (MULTIVITAMIN WITH MINERALS) TABS tablet Take 1 tablet by mouth daily.    Historical Provider, MD  nitroGLYCERIN (NITROSTAT) 0.4 MG SL tablet Place 1 tablet (0.4 mg total) under the tongue every 5 (five) minutes as  needed for chest pain. 10/04/12   Ok Anis, NP  pantoprazole (PROTONIX) 40 MG tablet Take 40 mg by mouth daily.    Historical Provider, MD  pindolol (VISKEN) 5 MG tablet Take 2.5-5 mg by mouth See admin instructions. Take 1 tablet in the morning and 0.5 tablet in the evening.    Historical Provider, MD  torsemide (DEMADEX) 20 MG tablet Take 1 tablet (20 mg total) by mouth daily. Take on Tuesday, Thursday and Saturday at 6pm. 11/12/13   Teton N Rumley, DO   BP 118/71  Pulse 98  Temp(Src) 99.6 F (37.6 C) (Rectal)  Resp 22  SpO2 100% Physical Exam  Nursing note and vitals reviewed. Constitutional: She is oriented to person, place, and time. She appears well-developed. She appears distressed.  HENT:  Head: Normocephalic and atraumatic.  Eyes: Conjunctivae and EOM are normal. Pupils are equal, round, and reactive to light. No scleral icterus.  Neck: Normal range of motion. Neck supple. No JVD present. No tracheal deviation present. No thyromegaly present.  Cardiovascular: Regular rhythm and normal heart sounds.  Exam reveals no gallop and no friction rub.   No murmur heard. tachycardia  Pulmonary/Chest: She is in respiratory distress. She has wheezes. She exhibits no tenderness.  Tachypnea, inc wob, accessory muscle use, diffuse crackles throughout lung fields  Abdominal: Soft. Bowel sounds are normal. She exhibits no distension and no mass. There is no tenderness. There is no rebound and no guarding.  Foley in place with blue urine in bag  Musculoskeletal: Normal range of motion. She exhibits no edema and no tenderness.  Lymphadenopathy:    She has no cervical adenopathy.  Neurological: She is alert and oriented to person, place, and time. No cranial nerve deficit. She exhibits normal muscle tone.  Skin: Skin is warm. No rash noted. She is diaphoretic. No erythema. No pallor.    ED Course  Procedures (including critical care time) Labs Review Labs Reviewed  CBC WITH  DIFFERENTIAL - Abnormal; Notable for the following:    WBC 11.1 (*)    Hemoglobin 11.1 (*)    MCHC 27.1 (*)    Neutrophils Relative % 78 (*)    Neutro Abs 8.6 (*)    All other components within normal limits  COMPREHENSIVE METABOLIC PANEL - Abnormal; Notable for the following:    Potassium 5.6 (*)    CO2 17 (*)    Glucose, Bld 162 (*)    BUN 39 (*)    Creatinine, Ser 1.60 (*)    Total Protein 9.0 (*)    Albumin 3.2 (*)    GFR calc non Af Amer 26 (*)    GFR calc Af Amer 30 (*)    Anion gap 18 (*)    All other components within normal limits  LIPASE, BLOOD -  Abnormal; Notable for the following:    Lipase 118 (*)    All other components within normal limits  I-STAT CG4 LACTIC ACID, ED - Abnormal; Notable for the following:    Lactic Acid, Venous 5.03 (*)    All other components within normal limits  I-STAT CHEM 8, ED - Abnormal; Notable for the following:    Chloride 113 (*)    BUN 43 (*)    Creatinine, Ser 1.70 (*)    Glucose, Bld 163 (*)    All other components within normal limits  I-STAT TROPOININ, ED - Abnormal; Notable for the following:    Troponin i, poc 1.27 (*)    All other components within normal limits  URINE CULTURE  PROTIME-INR  URINALYSIS, ROUTINE W REFLEX MICROSCOPIC  PRO B NATRIURETIC PEPTIDE  TROPONIN I  LACTIC ACID, PLASMA  BLOOD GAS, VENOUS    Imaging Review Ct Angio Chest Pe W/cm &/or Wo Cm  01/05/2014   CLINICAL DATA:  Abdominal pain, shortness of breath, wheezing.  EXAM: CT ANGIOGRAPHY CHEST, ABDOMEN AND PELVIS  TECHNIQUE: Multidetector CT imaging through the chest, abdomen and pelvis was performed using the standard protocol during bolus administration of intravenous contrast. Multiplanar reconstructed images and MIPs were obtained and reviewed to evaluate the vascular anatomy.  CONTRAST:  80 cc Omnipaque 350  COMPARISON:  07/26/2012  FINDINGS: CTA CHEST FINDINGS  Degraded by respiratory motion and streak artifact from extremity positioning. Within  this limitation, no pulmonary embolism identified.  Tortuous thoracic aorta and branch vessels without aneurysmal dilatation.  Cardiomegaly.  Coronary artery calcifications.  No pericardial effusion.  Small bilateral pleural effusions.  Prominent mediastinal and right hilar lymph nodes measuring up to 1.3 cm short axis.  Bilateral ground-glass opacities and interlobular septal thickening. More confluent lung base opacities, favor atelectasis.  No pneumothorax. Limited airway evaluation due to respiratory motion. Grossly, central airways are patent.  Review of the MIP images confirms the above findings.  CTA ABDOMEN AND PELVIS FINDINGS  Scattered atherosclerotic disease of the abdominal aorta and branch vessels. Aortic tortuosity without aneurysmal dilatation. Patent celiac axis, SMA, IMA, right renal artery. Diminutive left renal artery.  Organ evaluation is limited in the arterial phase. Within this limitation, hepatic cyst has a nonaggressive appearance. No appreciable acute abnormality of the spleen, biliary system, pancreas, adrenal glands. Atrophic left kidney. Chronic left renal pelvic dilatation versus prominent renal sinus cyst, similar to prior. No hydroureteronephrosis on the right.  Colonic diverticulosis. No CT evidence for colitis or diverticulitis. Appendix within normal limits. No right lower quadrant inflammation. Small bowel loops are of normal course and caliber. No free intraperitoneal air or fluid. No lymphadenopathy.  Foley catheter within a decompressed bladder. No appreciable abnormality of the uterus or adnexa.  Osteopenia. T11, T8, T5 compression fractures, with some interval progression at T5.  Review of the MIP images confirms the above findings.  IMPRESSION: No pulmonary embolism.  Cardiomegaly. Central vascular congestion. Ground-glass opacities and interlobular septal thickening, most in keeping with pulmonary edema.  Prominent mediastinal and right hilar lymph nodes are nonspecific and  may be reactive.  Diffuse osteopenia and multilevel compression fractures including mild interval progression at T5. Correlate for point tenderness.  No acute abdominopelvic process identified by CT.  Colonic diverticulosis.   Electronically Signed   By: Jearld Lesch M.D.   On: 01/05/2014 05:49   Ct Angio Abdomen W/cm &/or Wo Contrast  01/05/2014   CLINICAL DATA:  Abdominal pain, shortness of breath, wheezing.  EXAM: CT  ANGIOGRAPHY CHEST, ABDOMEN AND PELVIS  TECHNIQUE: Multidetector CT imaging through the chest, abdomen and pelvis was performed using the standard protocol during bolus administration of intravenous contrast. Multiplanar reconstructed images and MIPs were obtained and reviewed to evaluate the vascular anatomy.  CONTRAST:  80 cc Omnipaque 350  COMPARISON:  07/26/2012  FINDINGS: CTA CHEST FINDINGS  Degraded by respiratory motion and streak artifact from extremity positioning. Within this limitation, no pulmonary embolism identified.  Tortuous thoracic aorta and branch vessels without aneurysmal dilatation.  Cardiomegaly.  Coronary artery calcifications.  No pericardial effusion.  Small bilateral pleural effusions.  Prominent mediastinal and right hilar lymph nodes measuring up to 1.3 cm short axis.  Bilateral ground-glass opacities and interlobular septal thickening. More confluent lung base opacities, favor atelectasis.  No pneumothorax. Limited airway evaluation due to respiratory motion. Grossly, central airways are patent.  Review of the MIP images confirms the above findings.  CTA ABDOMEN AND PELVIS FINDINGS  Scattered atherosclerotic disease of the abdominal aorta and branch vessels. Aortic tortuosity without aneurysmal dilatation. Patent celiac axis, SMA, IMA, right renal artery. Diminutive left renal artery.  Organ evaluation is limited in the arterial phase. Within this limitation, hepatic cyst has a nonaggressive appearance. No appreciable acute abnormality of the spleen, biliary  system, pancreas, adrenal glands. Atrophic left kidney. Chronic left renal pelvic dilatation versus prominent renal sinus cyst, similar to prior. No hydroureteronephrosis on the right.  Colonic diverticulosis. No CT evidence for colitis or diverticulitis. Appendix within normal limits. No right lower quadrant inflammation. Small bowel loops are of normal course and caliber. No free intraperitoneal air or fluid. No lymphadenopathy.  Foley catheter within a decompressed bladder. No appreciable abnormality of the uterus or adnexa.  Osteopenia. T11, T8, T5 compression fractures, with some interval progression at T5.  Review of the MIP images confirms the above findings.  IMPRESSION: No pulmonary embolism.  Cardiomegaly. Central vascular congestion. Ground-glass opacities and interlobular septal thickening, most in keeping with pulmonary edema.  Prominent mediastinal and right hilar lymph nodes are nonspecific and may be reactive.  Diffuse osteopenia and multilevel compression fractures including mild interval progression at T5. Correlate for point tenderness.  No acute abdominopelvic process identified by CT.  Colonic diverticulosis.   Electronically Signed   By: Jearld LeschAndrew  DelGaizo M.D.   On: 01/05/2014 05:49   Dg Chest Port 1 View  01/05/2014   CLINICAL DATA:  Initial evaluation for hypoxia, shortness of breath.  EXAM: PORTABLE CHEST - 1 VIEW  COMPARISON:  Prior radiograph from 11/10/2013  FINDINGS: Cardiomegaly is stable from prior study. Mediastinal silhouette within normal limits. Atherosclerotic calcifications present within the aortic arch.  Lungs are normally inflated parent small bilateral pleural effusions are present. Diffuse interstitial thickening is most compatible with interstitial edema. Patchy bibasilar opacities most likely reflect edema and/ or atelectasis. No definite focal infiltrates. No pneumothorax.  Diffuse osteopenia present.  No acute osseous abnormality.  IMPRESSION: 1. Cardiomegaly with  mild-to-moderate diffuse interstitial edema. 2. Small bilateral pleural effusions. 3. Mild patchy bibasilar opacities, most likely atelectasis and/or edema.   Electronically Signed   By: Rise MuBenjamin  McClintock M.D.   On: 01/05/2014 05:35     EKG Interpretation   Date/Time:  Monday January 05 2014 04:39:15 EDT Ventricular Rate:  101 PR Interval:  178 QRS Duration: 85 QT Interval:  329 QTC Calculation: 426 R Axis:   70 Text Interpretation:  Sinus tachycardia Probable left atrial enlargement  LVH with secondary repolarization abnormality Artifact Confirmed by Erroll Lunani,  Esra Frankowski Ayokunle [phone]([id number])  on 01/05/2014 5:04:31 AM      MDM   Final diagnoses:  Abdominal pain  Hypoxia  Flash pulmonary edema  Respiratory depression    Patient was in severe distress upon ED arrival.  Hypoic to the 50s, tachypnea, and tachycardic.  She is complaining of SOB and abd pain.  She was immediately placed on Bipap and taken to CT scan of C/A/P for eval for PE vs. Mesenteric ischemia.  Bedside US did not show any RV dilation.   Patient has crackles throughout lung exam consistent with flash pulmonary edema.  She was placed on bipap and nitro gtt.  Upon repeat assessment the patient appears much more comfortable, tachypnea has resolved and vital signs have normalized.  [place] also shows mild infection, given ceftriaxone, may be cause of abdominal pain.  Also could have been some mild ischemia. Trop elevated as well, likely due to demand ischemia with no EKG changes.  Patient had respiratory depression, will call fam med for admission to step down unit.   Angiocath insertion Performed by: Tomasita Crumble  Consent: Verbal consent obtained. Risks and benefits: risks, benefits and alternatives were discussed Time out: Immediately prior to procedure a "time out" was called to verify the correct patient, procedure, equipment, support staff and site/side marked as required.  Preparation: Patient was prepped and draped in the  usual sterile fashion.  Vein Location: L brachial vein  This IV was Ultrasound Guided and visualized entering skin and vein.  Gauge: 20 G  Normal blood return and flush without difficulty Patient tolerance: Patient tolerated the procedure well with no immediate complications.    .  CRITICAL CARE Performed by: Tomasita Crumble   Total critical care time: 40 min  Critical care time was exclusive of separately billable procedures and treating other patients.  Critical care was necessary to treat or prevent imminent or life-threatening deterioration.  Critical care was time spent personally by me on the following activities: development of treatment plan with patient and/or surrogate as well as nursing, discussions with consultants, evaluation of patient's response to treatment, examination of patient, obtaining history from patient or surrogate, ordering and performing treatments and interventions, ordering and review of laboratory studies, ordering and review of radiographic studies, pulse oximetry and re-evaluation of patient's condition.     Tomasita Crumble, MD 01/06/14 1610  Tomasita Crumble, MD 01/06/14 1046

## 2014-01-05 NOTE — H&P (Signed)
Family Medicine [facility] Admission History and Physical Service Pager: [phone]  Patient name: Yvette [name] Medical record number: [id number] Date of birth: 01/26/1917 Age: 80 y.o. Gender: female  Primary Care Provider: Maryjean KaStreet, Christopher, MD Consultants: Cardiology Code Status: Full  Chief Complaint: Abdominal pain and dyspnea  Assessment and Plan: Yvette [name] is a 80 y.o. female with past medical history of CKD 3-4, CAD status post MI, dementia, hypertension, history of CVA, paroxysmal Aflutter, sick sinus syndrome presenting with abdominal pain and acute respiratory failure.   Acute respiratory failure secondary to acute CHF exacerbation: Patient hypoxic on admission with elevated BNP (9191).  Chest xray and CTA findings consistent with pulmonary edema/acute CHF. CTA negative for PE.  Per daughter no recent weight gain and patient is compliant with medications.  Acute decompensation could be secondary to cardiac event given elevated troponin (1.27). -Admit to SDU -Continue BiPAP (respiratory to titrate) -Will diurese w/ Lasix IV 40 mg BID; consulting cardiology this am -repeat EKG  -cycle troponins  -Echo  -Consider palliative care consult  Abdominal Pain and Lactic Acidosis - Unclear etiology.  Lactic acidosis likely secondary to volume depletion in setting of acute CHF exacerbation.  Abdominal exam benign.  -CT abdomen negative for acute findings -Will trend lactic acid -Serial abdominal exams -Will monitor closely.  CAD, Sick Sinus Syndrome -Continue Imdur and BB  -ASA 325 x 1; then 81 mg daily -Continuing home Pindolol  Paroxysmal a-flutter: Sinus tach on EKG -Monitor closely on Tele  CKD 4: Baseline 1.3-1.7; Stable currently.  -Will monitor closely in setting of diuresis.   HTN: BP 159/69  -Continue Hydralazine and Imdur   Hx of CVA -ASA as above.  Urinary retention: Foley catheter in place. Sees Dr Mena GoesEskridge outpatient   FEN/GI: NPO except sips  w/ meds Prophylaxis: Heparin  Disposition: SDU; pending clinical improvement; Consider palliative consult.   History of Present Illness: Yvette [name] is a 80 y.o. female with past medical history of CKD 3-4, CAD status post MI, dementia, hypertension, history of CVA, paroxysmal Aflutter, sick sinus sydrome, significant for presenting with abdominal pain and acute SOB.   Level 5 caveat due to critical illness (patient on BIPAP) and language barrier.   History obtained from the daughter.  She reports that at 3:00 this morning the patient woke up complaining of severe abdominal pain (right lower abdomen).  She reports associated nausea. No vomiting.  No recent fevers, chills.  No recent constpation (normal BM yesterday).  No reports of hematochezia or melena.    Daughter also reports that when she woke up complaining of abdominal pain she had increasing shortness of breath.    Daughter subsequently call 911 and patient was transported to the hospital.  Per the ED physician note, patient was hypoxic in route.  She was subsequently placed on BiPAP.  Workup revealed lactic acidosis of 5, mild leukocytosis of 11.1 (w/ left shift), and Xray/CT findings consistent with acute pulmonary edema.   Of note, the daughter reports that her weights have been stable at 94-95 pounds (no recent weight gain).   Review Of Systems: Per HPI. Otherwise 12 point review of systems was performed and was unremarkable.  Patient Active Problem List   Diagnosis Date Noted  . Flash pulmonary edema 01/05/2014  . Cataracts, bilateral, very poor vision 12/25/2013  . Poor vision 12/25/2013  . Impaired mobility and ADLs 12/25/2013  . Hearing loss 12/25/2013  . Cerumen impaction 12/25/2013  . Dementia 12/25/2013  . Circadian rhythm disorder 12/25/2013  .  Chronic indwelling Foley catheter 11/10/2013  . Unstable angina, history of 10/04/2012  . History of ST elevation myocardial infarction (STEMI) 09/24/2012  . Vertebral  fracture 07/28/2012  . CAD (coronary artery disease) 07/25/2012  . Urinary retention 07/25/2012  . Acute on chronic diastolic CHF (congestive heart failure), NYHA class 1 10/05/2011  . HTN (hypertension) 10/05/2011  . CKD (chronic kidney disease), stage III 04/26/2011  . Anxiety 05/05/2010  . Paroxysmal atrial flutter   . Chronic diastolic heart failure   . Sick sinus syndrome   . ATRIAL FLUTTER 05/02/2010  . BRADYCARDIA-TACHYCARDIA SYNDROME 05/02/2010   Past Medical History: Past Medical History  Diagnosis Date  . Paroxysmal atrial flutter [facility] admission 2/5-04/20/2010    High fall risk-not a Coumadin candidate  . Hypertension   . Chronic diastolic heart failure     a. Echo February 2012: EF 55%; moderate LVH; mild AI; mild MR; PASP 39  . Sick sinus syndrome     a. Limits use of beta blocker-pindolol tolerated  . CAD (coronary artery disease) PRESUMED    a. 04/2010 NSTEMI->med rx;  b. 09/2012 inf stemi->med rx.  . Arthritis   . Anxiety   . Hydronephrosis     Chronic  . Chronic back pain   . Stroke   . Herpes zoster 03/01/2012  . Myocardial infarction   . CHF (congestive heart failure)   . Chronic indwelling Foley catheter 11/10/2013  . Cataracts, bilateral, very poor vision 12/25/2013  . UTI (urinary tract infection) 10/04/2012  . Unstable angina 10/04/2012  . SOB (shortness of breath) 10/05/2011  . Respiratory distress 11/10/2013  . Protein-calorie malnutrition, severe 11/11/2013  . Physical deconditioning 04/26/2011  . Loss of weight 08/29/2012  . Fatigue 10/04/2011  . Encounter for palliative care 07/24/2012  . Acute on chronic diastolic CHF (congestive heart failure), NYHA class 1 10/05/2011  . History of ST elevation myocardial infarction (STEMI) 09/24/2012   Past Surgical History: Past Surgical History  Procedure Laterality Date  . Esophagogastroduodenoscopy  04/19/2011    Procedure: ESOPHAGOGASTRODUODENOSCOPY (EGD);  Surgeon: Erick Blinks, MD;  Location: [facility] ENDOSCOPY;  Service:  Gastroenterology;  Laterality: N/A;   Social History: History  Substance Use Topics  . Smoking status: Never Smoker   . Smokeless tobacco: Never Used  . Alcohol Use: No   Family History: Family History  Problem Relation Age of Onset  . CAD Neg Hx    Allergies and Medications: No Known Allergies No current facility-administered medications on file prior to encounter.   Current Outpatient Prescriptions on File Prior to Encounter  Medication Sig Dispense Refill  . aspirin EC 81 MG tablet Take 81 mg by mouth daily.      . diclofenac sodium (VOLTAREN) 1 % GEL Apply 2 g topically 4 (four) times daily.  1 Tube  5  . hydrALAZINE (APRESOLINE) 50 MG tablet Take 50 mg by mouth 4 (four) times daily.      . isosorbide mononitrate (IMDUR) 30 MG 24 hr tablet Take 15 mg by mouth daily.      . megestrol (MEGACE) 40 MG tablet Take 160 mg by mouth daily.      . Melatonin 5 MG TABS Take 10 mg by mouth at bedtime.       . Meth-Hyo-M Bl-Na Phos-Ph Sal (URIBEL) 118 MG CAPS Take 1 capsule by mouth 4 (four) times daily.      . Multiple Vitamin (MULTIVITAMIN WITH MINERALS) TABS tablet Take 1 tablet by mouth daily.      Marland Kitchen  nitroGLYCERIN (NITROSTAT) 0.4 MG SL tablet Place 1 tablet (0.4 mg total) under the tongue every 5 (five) minutes as needed for chest pain.  25 tablet  3  . pantoprazole (PROTONIX) 40 MG tablet Take 40 mg by mouth daily.      . pindolol (VISKEN) 5 MG tablet Take 2.5-5 mg by mouth See admin instructions. Take 1 tablet in the morning and 0.5 tablet in the evening.      . torsemide (DEMADEX) 20 MG tablet Take 1 tablet (20 mg total) by mouth daily. Take on Tuesday, Thursday and Saturday at 6pm.  30 tablet  0    Objective: BP 118/71  Pulse 98  Temp(Src) 99.6 F (37.6 C) (Rectal)  Resp 22  SpO2 100% Exam General: chronically ill appearing female on BIPAP HEENT: NCAT.  Lungs: Bibasilar rales noted.  No increased WOB currently (on BIPAP).  Heart: RRR, Soft systolic murmur (LSB). Abdomen:  soft, nontender to palpation. Protuberant/mildly distended. No palpable organomegaly. No rebound or guarding.  Extremities: Trace LE edema.  Neurologic: No focal deficits although difficult to fully assess secondary to acute illness (BIPAP in place) and language barrier.  Skin: warm, dry, intact.   Labs and Imaging: CBC BMET   Recent Labs Lab 01/05/14 0421 01/05/14 0437  WBC 11.1*  --   HGB 11.1* 14.6  HCT 41.0 43.0  PLT 342  --     Recent Labs Lab 01/05/14 0421 01/05/14 0437  NA 141 143  K 5.6* 5.3  CL 106 113*  CO2 17*  --   BUN 39* 43*  CREATININE 1.60* 1.70*  GLUCOSE 162* 163*  CALCIUM 9.2  --      Cardiac Panel (last 3 results)  Recent Labs  01/05/14 0400  TROPONINI 1.49*   BNP    Component Value Date/Time   PROBNP 9191.0* 01/05/2014 0400   Lactic Acid 5.03  EKG - Sinus tachycardia; LVH; Twave inversion (V4-V6; no change from prior); Artifact/wandering.   Ct Angio Chest Pe W/cm &/or Wo Cm 01/05/2014 IMPRESSION: No pulmonary embolism.  Cardiomegaly. Central vascular congestion. Ground-glass opacities and interlobular septal thickening, most in keeping with pulmonary edema.  Prominent mediastinal and right hilar lymph nodes are nonspecific and may be reactive.  Diffuse osteopenia and multilevel compression fractures including mild interval progression at T5. Correlate for point tenderness.  No acute abdominopelvic process identified by CT.  Colonic diverticulosis.    Ct Angio Abdomen W/cm &/or Wo Contrast 01/05/2014  IMPRESSION: No pulmonary embolism.  Cardiomegaly. Central vascular congestion. Ground-glass opacities and interlobular septal thickening, most in keeping with pulmonary edema.  Prominent mediastinal and right hilar lymph nodes are nonspecific and may be reactive.  Diffuse osteopenia and multilevel compression fractures including mild interval progression at T5. Correlate for point tenderness.  No acute abdominopelvic process identified by CT.   Colonic diverticulosis.    Dg Chest Port 1 View 01/05/2014  IMPRESSION: 1. Cardiomegaly with mild-to-moderate diffuse interstitial edema. 2. Small bilateral pleural effusions. 3. Mild patchy bibasilar opacities, most likely atelectasis and/or edema.    Everlene OtherJayce Arik Husmann DO Family Medicine PGY-3 Pager #: [phone]

## 2014-01-05 NOTE — Progress Notes (Signed)
Dr. Lindell Sparno requested this pt be injected for this emergent study even though her GFR was under 30 and her creat was 1.7 Per Dr. Sunday Spillersalessio if the ER dr. feels this is emergent enough to override our policy and procedure we can perform the exam 01/05/14 at 4:40am/jmh

## 2014-01-05 NOTE — ED Notes (Addendum)
Dr Jacquelyne Balintmcdermott, family medicine md, states he will change bed request to telemetry.

## 2014-01-05 NOTE — Progress Notes (Addendum)
Paged Teaching Service about patient's daughters' concern for her to be taking medication "uribel" will await orders if MD allows this.    Medication was ordered. Daughter was updated.

## 2014-01-05 NOTE — ED Notes (Signed)
Elevated CG-4 and Troponin results reported to Dr. Mora Bellmanni.

## 2014-01-05 NOTE — ED Notes (Signed)
Patient from home. Language barrier. Patient is having mid lower abdominal pain tha started this morning as well as SOB. Patient was wheezing and EMS gave 5mg  of Albuterol. Sats 98-100%. Patient has indwelling catheter and has only had 90 ml of output since this morning. Hx of HTN and CHF

## 2014-01-05 NOTE — Progress Notes (Signed)
UR completed Kaidynce Pfister K. Temekia Caskey, RN, BSN, MSHL, CCM  01/05/2014 4:34 PM

## 2014-01-05 NOTE — H&P (Signed)
I have seen and examined this patient. I have discussed with Dr Adriana Simasook.  I agree with their findings and plans as documented in their admission note.  Acute Issues  1.  Acute cardiac Pulmonary edema - Increase in Troponin rise - No acute changes in ECG - Pulmonary edema bilaterally on CTA chest and CHEST XRAY - ischemic cardiomyopathy with preserved ejection fraction.   2. Possible LLL infiltrate on CTA with low grade fever and mild leukocytosis - Atypical presentation of pneumonia common in very old patients - Possible Pneumonia  Ongoing issues 1. Bilateral cataracts with poor vision 2. Dependent in all ADLs including eating 3. Probable Sensorineural Hearing loss 4. Probable Dementia, Stage 7 with preserved language.  Palliative Performance score prior to this acute illness 40%.  5. Full code. Discussed with Dgt at bedside.  6. High Risk of Delirium.   Recommendation In order for patient to transfer out of ED, will change to telemetry bed request. Medical management of possible NSTEMI Vs demand ischemia with aspirin, Lovenox, beta blocker (on Pindolol) Consult with Cardiology Start levoquin 750 mg ORAL q 48 hrs for possible pneumonia Active Diuresis. Foley cath for duration of aggressive diuresis.

## 2014-01-05 NOTE — Progress Notes (Signed)
ANTICOAGULATION CONSULT NOTE - Initial Consult  Pharmacy Consult for LMWH Indication: chest pain/ACS  No Known Allergies  Patient Measurements: Weight: 95 lb 14.4 oz (43.5 kg) (01/01/14)   Vital Signs: Temp: 99.6 F (37.6 C) (10/26 0552) Temp Source: Rectal (10/26 0552) BP: 151/83 mmHg (10/26 1300) Pulse Rate: 89 (10/26 1300)  Labs:  Recent Labs  01/05/14 0400 01/05/14 0421 01/05/14 0437 01/05/14 1047  HGB  --  11.1* 14.6  --   HCT  --  41.0 43.0  --   PLT  --  342  --   --   LABPROT  --  14.1  --   --   INR  --  1.07  --   --   CREATININE  --  1.60* 1.70*  --   TROPONINI 1.49*  --   --  1.71*    The CrCl is unknown because both a height and weight (above a minimum accepted value) are required for this calculation.   Medical History: Past Medical History  Diagnosis Date  . [facility]    High fall risk-not a Coumadin candidate  . Hypertension   . Chronic diastolic heart failure     a. Echo February 2012: EF 55%; moderate LVH; mild AI; mild MR; PASP 39  . Sick sinus syndrome     a. Limits use of beta blocker-pindolol tolerated  . CAD (coronary artery disease) PRESUMED    a. 04/2010 NSTEMI->med rx;  b. 09/2012 inf stemi->med rx.  . Arthritis   . Anxiety   . Hydronephrosis     Chronic  . Chronic back pain   . Stroke   . Herpes zoster 03/01/2012  . Myocardial infarction   . CHF (congestive heart failure)   . Chronic indwelling Foley catheter 11/10/2013  . Cataracts, bilateral, very poor vision 12/25/2013  . UTI (urinary tract infection) 10/04/2012  . Unstable angina 10/04/2012  . SOB (shortness of breath) 10/05/2011  . Respiratory distress 11/10/2013  . Protein-calorie malnutrition, severe 11/11/2013  . Physical deconditioning 04/26/2011  . Loss of weight 08/29/2012  . Fatigue 10/04/2011  . Encounter for palliative care 07/24/2012  . Acute on chronic diastolic CHF (congestive heart failure), NYHA class 1 10/05/2011  .  History of ST elevation myocardial infarction (STEMI) 09/24/2012     Assessment: 80 yo F to start LMWH for ACS treatment.  WT 43.5 kg, creat 1.70, creat cl < 30 ml/min.  Hg 14.6, pltc 322. No bleeding reported. I confirmed with MD that full dose anticoagulation is desired for r/o NSTEMI.   Heparin 5K sq given at 1032  Goal of Therapy:  Anti-Xa level 0.6-1 units/ml 4hrs after LMWH dose given Monitor platelets by anticoagulation protocol: Yes   Plan:  -LMWH 1 mg/kg sq q24h = LMWH 40mg  q24 is FULL dose LMWH for ACS treatment, not VTE px -dc sq heparin 5000 sq q8h for VTE px -f/u renal fxn and CBC  Herby AbrahamMichelle T. Eryn Marandola, Pharm.D. [phone] 01/05/2014 2:34 PM

## 2014-01-05 NOTE — Consult Note (Addendum)
Primary Physician: Primary Cardiologist:  Hochrein   HPI: Patient is a 80 yo who we are asked to see re CHF The patient was last seen in clininc by L Kilroy on 10/22.  Hx of chronic diastolic CHF, CAD (STEMI 2012, Rx medically), CRI, PAF (not coumadin candidate due to falls), SSS and HTN. [facility] with acute on chronic diastolic CHF in August.  Palliative care declined by family.  When she was in clinic last week volume status appeared to be OK.  She presented today to ER with abdominal pain and resp failure.  Woke up acutedly SOB  BNP 9191. CXR with pulm edema.  CTA neg for PE >  Daughter noted no recent wt gain.  Lactic acid was 5.   Trop elevated at 1.27  IVlasix started.   CT of abdomen was also negative.          Past Medical History  Diagnosis Date  . Paroxysmal atrial flutter [facility] 2/5[facility]04/20/2010    High fall risk-not a Coumadin candidate  . Hypertension   . Chronic diastolic heart failure     a. Echo February 2012: EF 55%; moderate LVH; mild AI; mild MR; PASP 39  . Sick sinus syndrome     a. Limits use of beta blocker-pindolol tolerated  . CAD (coronary artery disease) PRESUMED    a. 04/2010 NSTEMI->med rx;  b. 09/2012 inf stemi->med rx.  . Arthritis   . Anxiety   . Hydronephrosis     Chronic  . Chronic back pain   . Stroke   . Herpes zoster 03/01/2012  . Myocardial infarction   . CHF (congestive heart failure)   . Chronic indwelling Foley catheter 11/10/2013  . Cataracts, bilateral, very poor vision 12/25/2013  . UTI (urinary tract infection) 10/04/2012  . Unstable angina 10/04/2012  . SOB (shortness of breath) 10/05/2011  . Respiratory distress 11/10/2013  . Protein-calorie malnutrition, severe 11/11/2013  . Physical deconditioning 04/26/2011  . Loss of weight 08/29/2012  . Fatigue 10/04/2011  . Encounter for palliative care 07/24/2012  . Acute on chronic diastolic CHF (congestive heart failure), NYHA class 1 10/05/2011  . History of ST  elevation myocardial infarction (STEMI) 09/24/2012     (Not in a hospital admission)   . [START ON 01/06/2014] aspirin EC  81 mg Oral Daily  . furosemide  40 mg Intravenous BID  . heparin  5,000 Units Subcutaneous 3 times per day  . hydrALAZINE  50 mg Oral QID  . isosorbide mononitrate  15 mg Oral Daily  . sodium chloride  3 mL Intravenous Q12H    Infusions: . sodium chloride      No Known Allergies  History   Social History  . Marital Status: Widowed    Spouse Name: N/A    Number of Children: 7  . Years of Education: 9   Occupational History  . Retired    Social History Main Topics  . Smoking status: Never Smoker   . Smokeless tobacco: Never Used  . Alcohol Use: No  . Drug Use: No  . Sexual Activity: No   Other Topics Concern  . Not on file   Social History Narrative   Pt lives with family. She has 24 hour care.    Family History  Problem Relation Age of Onset  . CAD Neg Hx     REVIEW OF SYSTEMS:  All systems reviewed  Negative to the above problem except as noted above.  PHYSICAL EXAM: Filed Vitals:   01/05/14 0900  BP: 141/75  Pulse: 92  Temp:   Resp: 23     Intake/Output Summary (Last 24 hours) at 01/05/14 0935 Last data filed at 01/05/14 0715  Gross per 24 hour  Intake    500 ml  Output      0 ml  Net    500 ml    General:  Well appearing. No respiratory difficulty HEENT: normal Neck: supple. no JVD. Carotids 2+ bilat; no bruits. No lymphadenopathy or thryomegaly appreciated. Cor: PMI nondisplaced. Regular rate & rhythm. No rubs, gallops or murmurs. Lungs: clear Abdomen: soft, nontender, nondistended. No hepatosplenomegaly. No bruits or masses. Good bowel sounds. Extremities: no cyanosis, clubbing, rash, edema Neuro: alert & oriented x 3, cranial nerves grossly intact. moves all 4 extremities w/o difficulty. Affect pleasant.  ECG:  10/26:  SR 99  Sl ST elevation in III,  T wave V4 to V6  Results for orders placed during the  hospital encounter of 01/05/14 (from the past 24 hour(s))  PRO B NATRIURETIC PEPTIDE     Status: Abnormal   Collection Time    01/05/14  4:00 AM      Result Value Ref Range   Pro B Natriuretic peptide (BNP) 9191.0 (*) 0 - 450 pg/mL  TROPONIN I     Status: Abnormal   Collection Time    01/05/14  4:00 AM      Result Value Ref Range   Troponin I 1.49 (*) <0.30 ng/mL  CBC WITH DIFFERENTIAL     Status: Abnormal   Collection Time    01/05/14  4:21 AM      Result Value Ref Range   WBC 11.1 (*) 4.0 - 10.5 K/uL   RBC 4.23  3.87 - 5.11 MIL/uL   Hemoglobin 11.1 (*) 12.0 - 15.0 g/dL   HCT 16.1  09.6 - 04.5 %   MCV 96.9  78.0 - 100.0 fL   MCH 26.2  26.0 - 34.0 pg   MCHC 27.1 (*) 30.0 - 36.0 g/dL   RDW 40.9  81.1 - 91.4 %   Platelets 342  150 - 400 K/uL   Neutrophils Relative % 78 (*) 43 - 77 %   Neutro Abs 8.6 (*) 1.7 - 7.7 K/uL   Lymphocytes Relative 15  12 - 46 %   Lymphs Abs 1.7  0.7 - 4.0 K/uL   Monocytes Relative 5  3 - 12 %   Monocytes Absolute 0.5  0.1 - 1.0 K/uL   Eosinophils Relative 2  0 - 5 %   Eosinophils Absolute 0.2  0.0 - 0.7 K/uL   Basophils Relative 0  0 - 1 %   Basophils Absolute 0.0  0.0 - 0.1 K/uL  COMPREHENSIVE METABOLIC PANEL     Status: Abnormal   Collection Time    01/05/14  4:21 AM      Result Value Ref Range   Sodium 141  137 - 147 mEq/L   Potassium 5.6 (*) 3.7 - 5.3 mEq/L   Chloride 106  96 - 112 mEq/L   CO2 17 (*) 19 - 32 mEq/L   Glucose, Bld 162 (*) 70 - 99 mg/dL   BUN 39 (*) 6 - 23 mg/dL   Creatinine, Ser 7.82 (*) 0.50 - 1.10 mg/dL   Calcium 9.2  8.4 - 95.6 mg/dL   Total Protein 9.0 (*) 6.0 - 8.3 g/dL   Albumin 3.2 (*) 3.5 - 5.2 g/dL   AST 32  0 - 37 U/L   ALT 18  0 - 35 U/L   Alkaline Phosphatase 64  39 - 117 U/L   Total Bilirubin 0.4  0.3 - 1.2 mg/dL   GFR calc non Af Amer 26 (*) >90 mL/min   GFR calc Af Amer 30 (*) >90 mL/min   Anion gap 18 (*) 5 - 15  LIPASE, BLOOD     Status: Abnormal   Collection Time    01/05/14  4:21 AM      Result  Value Ref Range   Lipase 118 (*) 11 - 59 U/L  PROTIME-INR     Status: None   Collection Time    01/05/14  4:21 AM      Result Value Ref Range   Prothrombin Time 14.1  11.6 - 15.2 seconds   INR 1.07  0.00 - 1.49  I-STAT TROPOININ, ED     Status: Abnormal   Collection Time    01/05/14  4:35 AM      Result Value Ref Range   Troponin i, poc 1.27 (*) 0.00 - 0.08 ng/mL   Comment NOTIFIED PHYSICIAN     Comment 3           I-STAT CG4 LACTIC ACID, ED     Status: Abnormal   Collection Time    01/05/14  4:37 AM      Result Value Ref Range   Lactic Acid, Venous 5.03 (*) 0.5 - 2.2 mmol/L  I-STAT CHEM 8, ED     Status: Abnormal   Collection Time    01/05/14  4:37 AM      Result Value Ref Range   Sodium 143  137 - 147 mEq/L   Potassium 5.3  3.7 - 5.3 mEq/L   Chloride 113 (*) 96 - 112 mEq/L   BUN 43 (*) 6 - 23 mg/dL   Creatinine, Ser 0.981.70 (*) 0.50 - 1.10 mg/dL   Glucose, Bld 119163 (*) 70 - 99 mg/dL   Calcium, Ion 1.471.15  8.291.13 - 1.30 mmol/L   TCO2 18  0 - 100 mmol/L   Hemoglobin 14.6  12.0 - 15.0 g/dL   HCT 56.243.0  13.036.0 - 86.546.0 %  URINALYSIS, ROUTINE W REFLEX MICROSCOPIC     Status: Abnormal   Collection Time    01/05/14  6:01 AM      Result Value Ref Range   Color, Urine YELLOW  YELLOW   APPearance CLEAR  CLEAR   Specific Gravity, Urine 1.015  1.005 - 1.030   pH 6.0  5.0 - 8.0   Glucose, UA NEGATIVE  NEGATIVE mg/dL   Hgb urine dipstick NEGATIVE  NEGATIVE   Bilirubin Urine NEGATIVE  NEGATIVE   Ketones, ur NEGATIVE  NEGATIVE mg/dL   Protein, ur 30 (*) NEGATIVE mg/dL   Urobilinogen, UA 0.2  0.0 - 1.0 mg/dL   Nitrite NEGATIVE  NEGATIVE   Leukocytes, UA SMALL (*) NEGATIVE  URINE MICROSCOPIC-ADD ON     Status: Abnormal   Collection Time    01/05/14  6:01 AM      Result Value Ref Range   Squamous Epithelial / LPF RARE  RARE   WBC, UA 3-6  <3 WBC/hpf   RBC / HPF 0-2  <3 RBC/hpf   Bacteria, UA MANY (*) RARE   Urine-Other MUCOUS PRESENT    LACTIC ACID, PLASMA     Status: None   Collection  Time    01/05/14  6:57 AM      Result Value  Ref Range   Lactic Acid, Venous 1.4  0.5 - 2.2 mmol/L   Ct Angio Chest Pe W/cm &/or Wo Cm  01/05/2014   CLINICAL DATA:  Abdominal pain, shortness of breath, wheezing.  EXAM: CT ANGIOGRAPHY CHEST, ABDOMEN AND PELVIS  TECHNIQUE: Multidetector CT imaging through the chest, abdomen and pelvis was performed using the standard protocol during bolus administration of intravenous contrast. Multiplanar reconstructed images and MIPs were obtained and reviewed to evaluate the vascular anatomy.  CONTRAST:  80 cc Omnipaque 350  COMPARISON:  07/26/2012  FINDINGS: CTA CHEST FINDINGS  Degraded by respiratory motion and streak artifact from extremity positioning. Within this limitation, no pulmonary embolism identified.  Tortuous thoracic aorta and branch vessels without aneurysmal dilatation.  Cardiomegaly.  Coronary artery calcifications.  No pericardial effusion.  Small bilateral pleural effusions.  Prominent mediastinal and right hilar lymph nodes measuring up to 1.3 cm short axis.  Bilateral ground-glass opacities and interlobular septal thickening. More confluent lung base opacities, favor atelectasis.  No pneumothorax. Limited airway evaluation due to respiratory motion. Grossly, central airways are patent.  Review of the MIP images confirms the above findings.  CTA ABDOMEN AND PELVIS FINDINGS  Scattered atherosclerotic disease of the abdominal aorta and branch vessels. Aortic tortuosity without aneurysmal dilatation. Patent celiac axis, SMA, IMA, right renal artery. Diminutive left renal artery.  Organ evaluation is limited in the arterial phase. Within this limitation, hepatic cyst has a nonaggressive appearance. No appreciable acute abnormality of the spleen, biliary system, pancreas, adrenal glands. Atrophic left kidney. Chronic left renal pelvic dilatation versus prominent renal sinus cyst, similar to prior. No hydroureteronephrosis on the right.  Colonic diverticulosis.  No CT evidence for colitis or diverticulitis. Appendix within normal limits. No right lower quadrant inflammation. Small bowel loops are of normal course and caliber. No free intraperitoneal air or fluid. No lymphadenopathy.  Foley catheter within a decompressed bladder. No appreciable abnormality of the uterus or adnexa.  Osteopenia. T11, T8, T5 compression fractures, with some interval progression at T5.  Review of the MIP images confirms the above findings.  IMPRESSION: No pulmonary embolism.  Cardiomegaly. Central vascular congestion. Ground-glass opacities and interlobular septal thickening, most in keeping with pulmonary edema.  Prominent mediastinal and right hilar lymph nodes are nonspecific and may be reactive.  Diffuse osteopenia and multilevel compression fractures including mild interval progression at T5. Correlate for point tenderness.  No acute abdominopelvic process identified by CT.  Colonic diverticulosis.   Electronically Signed   By: Jearld Lesch M.D.   On: 01/05/2014 05:49   Ct Angio Abdomen W/cm &/or Wo Contrast  01/05/2014   CLINICAL DATA:  Abdominal pain, shortness of breath, wheezing.  EXAM: CT ANGIOGRAPHY CHEST, ABDOMEN AND PELVIS  TECHNIQUE: Multidetector CT imaging through the chest, abdomen and pelvis was performed using the standard protocol during bolus administration of intravenous contrast. Multiplanar reconstructed images and MIPs were obtained and reviewed to evaluate the vascular anatomy.  CONTRAST:  80 cc Omnipaque 350  COMPARISON:  07/26/2012  FINDINGS: CTA CHEST FINDINGS  Degraded by respiratory motion and streak artifact from extremity positioning. Within this limitation, no pulmonary embolism identified.  Tortuous thoracic aorta and branch vessels without aneurysmal dilatation.  Cardiomegaly.  Coronary artery calcifications.  No pericardial effusion.  Small bilateral pleural effusions.  Prominent mediastinal and right hilar lymph nodes measuring up to 1.3 cm short  axis.  Bilateral ground-glass opacities and interlobular septal thickening. More confluent lung base opacities, favor atelectasis.  No pneumothorax. Limited airway evaluation due to  respiratory motion. Grossly, central airways are patent.  Review of the MIP images confirms the above findings.  CTA ABDOMEN AND PELVIS FINDINGS  Scattered atherosclerotic disease of the abdominal aorta and branch vessels. Aortic tortuosity without aneurysmal dilatation. Patent celiac axis, SMA, IMA, right renal artery. Diminutive left renal artery.  Organ evaluation is limited in the arterial phase. Within this limitation, hepatic cyst has a nonaggressive appearance. No appreciable acute abnormality of the spleen, biliary system, pancreas, adrenal glands. Atrophic left kidney. Chronic left renal pelvic dilatation versus prominent renal sinus cyst, similar to prior. No hydroureteronephrosis on the right.  Colonic diverticulosis. No CT evidence for colitis or diverticulitis. Appendix within normal limits. No right lower quadrant inflammation. Small bowel loops are of normal course and caliber. No free intraperitoneal air or fluid. No lymphadenopathy.  Foley catheter within a decompressed bladder. No appreciable abnormality of the uterus or adnexa.  Osteopenia. T11, T8, T5 compression fractures, with some interval progression at T5.  Review of the MIP images confirms the above findings.  IMPRESSION: No pulmonary embolism.  Cardiomegaly. Central vascular congestion. Ground-glass opacities and interlobular septal thickening, most in keeping with pulmonary edema.  Prominent mediastinal and right hilar lymph nodes are nonspecific and may be reactive.  Diffuse osteopenia and multilevel compression fractures including mild interval progression at T5. Correlate for point tenderness.  No acute abdominopelvic process identified by CT.  Colonic diverticulosis.   Electronically Signed   By: Jearld Lesch M.D.   On: 01/05/2014 05:49   Dg Chest  Port 1 View  01/05/2014   CLINICAL DATA:  Initial evaluation for hypoxia, shortness of breath.  EXAM: PORTABLE CHEST - 1 VIEW  COMPARISON:  Prior radiograph from 11/10/2013  FINDINGS: Cardiomegaly is stable from prior study. Mediastinal silhouette within normal limits. Atherosclerotic calcifications present within the aortic arch.  Lungs are normally inflated parent small bilateral pleural effusions are present. Diffuse interstitial thickening is most compatible with interstitial edema. Patchy bibasilar opacities most likely reflect edema and/ or atelectasis. No definite focal infiltrates. No pneumothorax.  Diffuse osteopenia present.  No acute osseous abnormality.  IMPRESSION: 1. Cardiomegaly with mild-to-moderate diffuse interstitial edema. 2. Small bilateral pleural effusions. 3. Mild patchy bibasilar opacities, most likely atelectasis and/or edema.   Electronically Signed   By: Rise Mu M.D.   On: 01/05/2014 05:35     ASSESSMENT:  80 yo with history of chronic diastolic CHF  Just seen in clinic last week  Had been doing well.  Developed severe abdominal pain last night  Presented to ER  Now with evid of CHF exacerbation. CT scan signif for pulm edema.  No signif abdominal abnormalities Labs signif for transient elevation of lactic acid.  Improved.  Also mild elevation of troponin (1.27) On exam, patient still with some evid of rales, mild volume increase  I am not sure what caused abdominal pain.  ? hypoperfsion with transient lactic acidosis.  This in turn could have lead to cardiac ischemia.  No evid for arrhythmia.   I would follow BP closely.  Exam closely  Keep on telemetry.  Patient has CAD  I would recomm medical management for above problems given age.   I agree with IV diuresis.  Close f/u of I/Os. I   2  CAD  As above  Follow  Medical Rx  3.  Afib Telemetry.  Not an anticoag candidate  4.CRI  Folllw labs  5.  HTN  Continue medical Rx  Follow BP.

## 2014-01-05 NOTE — Progress Notes (Signed)
Report given to receiving RN. Patient in bed resting, with family at bed side. No signs or symptoms of distress or discomfort noted.  

## 2014-01-05 NOTE — Care Management Note (Addendum)
  Page 2 of 2   01/08/2014     3:31:35 PM CARE MANAGEMENT NOTE 01/08/2014  Patient:  Yvette [name],Yvette   Account Number:  [id number]  Date Initiated:  01/05/2014  Documentation initiated by:  Donato SchultzHUTCHINSON,Cailie Bosshart  Subjective/Objective Assessment:   Pulmonary Edema     Action/Plan:   CM to follow for disposition needs   Anticipated DC Date:  01/07/2014   Anticipated DC Plan:  HOME/SELF CARE  In-house referral  NA      DC Planning Services  CM consult      PAC Choice  NA   Choice offered to / List presented to:  NA           Status of service:  Completed, signed off Medicare Important Message given?   (If response is "NO", the following Medicare IM given date fields will be blank) Date Medicare IM given:   Medicare IM given by:   Date Additional Medicare IM given:   Additional Medicare IM given by:    Discharge Disposition:  HOME/SELF CARE  Per UR Regulation:  Reviewed for med. necessity/level of care/duration of stay  If discussed at Long Length of Stay Meetings, dates discussed:    Comments:  Nil Bolser RN, BSN, MSHL, CCM  Nurse - Case Manager,  ([facility] [id number])  [phone]  01/08/2014 PT RECS:  None DME RECS:  None Medictions:  Compliant Transportation:  Family/DTR Dispo Plan:  Home / Self care.   [name] RN, BSN, MSHL, CCM  Nurse - Case Manager,  ([facility] [id number])  [phone]  01/05/2014 Hx/o 80 y.o. female with PMH of CKD 3-4, CAD status post MI, dementia, hypertension, Hx/o CVA, paroxysmal Aflutter, sick sinus syndrome presenting with abdominal pain and acute respiratory failure. Hx/o HHS with [facility] in 2015 PT RECS:  Pending. Dispo Plan:  Home / Self care with family caregiver/DTR. CM will continue to follow for needs as indicated.

## 2014-01-06 DIAGNOSIS — J810 Acute pulmonary edema: Secondary | ICD-10-CM

## 2014-01-06 DIAGNOSIS — N183 Chronic kidney disease, stage 3 (moderate): Secondary | ICD-10-CM

## 2014-01-06 DIAGNOSIS — I359 Nonrheumatic aortic valve disorder, unspecified: Secondary | ICD-10-CM

## 2014-01-06 DIAGNOSIS — I10 Essential (primary) hypertension: Secondary | ICD-10-CM

## 2014-01-06 DIAGNOSIS — I25110 Atherosclerotic heart disease of native coronary artery with unstable angina pectoris: Secondary | ICD-10-CM

## 2014-01-06 DIAGNOSIS — R7989 Other specified abnormal findings of blood chemistry: Secondary | ICD-10-CM

## 2014-01-06 DIAGNOSIS — I2584 Coronary atherosclerosis due to calcified coronary lesion: Secondary | ICD-10-CM

## 2014-01-06 MED ORDER — ENOXAPARIN SODIUM 30 MG/0.3ML ~~LOC~~ SOLN
30.0000 mg | SUBCUTANEOUS | Status: DC
  Filled 2014-01-06: qty 0.3

## 2014-01-06 NOTE — Progress Notes (Signed)
FMTS Attending Note Patient seen and examined by me, discussed with resident team and I agree with intern progress note for today.  Jkwon Treptow, MD 

## 2014-01-06 NOTE — Progress Notes (Signed)
Patient stated she was exhausted/tired but could not fall asleep.  Patient and family requested to have a low dosage of some sort of sleep aide.  MD on-call ordered trazodone 25mg .  Administered medication and will continue to monitor.

## 2014-01-06 NOTE — Progress Notes (Signed)
  Echocardiogram 2D Echocardiogram has been performed.  Ritika Hellickson FRANCES 01/06/2014, 11:30 AM

## 2014-01-06 NOTE — Evaluation (Signed)
Physical Therapy Evaluation Patient Details Name: Leslie Daleshan Aragones MRN: [id number] DOB: 12/05/1916 Today's Date: 01/06/2014   History of Present Illness  80 y.o. female with past medical history of CKD 3-4, CAD status post MI, dementia, hypertension, history of CVA, paroxysmal Aflutter, sick sinus syndrome presenting with abdominal pain and acute respiratory failure.  Pt with acute on chronic CHF.  Clinical Impression  Pt admitted with above. Pt currently with functional limitations due to the deficits listed below (see PT Problem List).  Pt will benefit from skilled PT to increase their independence and safety with mobility to allow discharge home with family. Family has been providing assistance at home and plans to continue to do so. Family denies need for any further equipment.     Follow Up Recommendations No PT follow up;Supervision/Assistance - 24 hour    Equipment Recommendations  None recommended by PT    Recommendations for Other Services       Precautions / Restrictions Precautions Precautions: Fall      Mobility  Bed Mobility Overal bed mobility: Needs Assistance Bed Mobility: Supine to Sit;Sit to Supine     Supine to sit: Mod assist Sit to supine: Min assist   General bed mobility comments: Assist to bring legs over and elevate trunk.  Transfers                    Ambulation/Gait                Stairs            Wheelchair Mobility    Modified Rankin (Stroke Patients Only)       Balance Overall balance assessment: Needs assistance Sitting-balance support: No upper extremity supported;Feet unsupported Sitting balance-Leahy Scale: Fair Sitting balance - Comments: Sat EOB 7-8 minutes with supervision                                     Pertinent Vitals/Pain Pain Assessment: Faces Faces Pain Scale: No hurt    Home Living Family/patient expects to be discharged to:: Private residence Living Arrangements: Children              Home Equipment: Bedside commode      Prior Function Level of Independence: Needs assistance   Gait / Transfers Assistance Needed: Family reports that pt is able to amb at home with hand-held assist for short distances.           Hand Dominance        Extremity/Trunk Assessment   Upper Extremity Assessment: Generalized weakness           Lower Extremity Assessment: Generalized weakness         Communication   Communication: Interpreter utilized (Pt's daughter)  Cognition Arousal/Alertness: Awake/alert Behavior During Therapy: WFL for tasks assessed/performed Overall Cognitive Status: History of cognitive impairments - at baseline                      General Comments      Exercises        Assessment/Plan    PT Assessment    PT Diagnosis Difficulty walking;Generalized weakness   PT Problem List    PT Treatment Interventions     PT Goals (Current goals can be found in the Care Plan section) Acute Rehab PT Goals Patient Stated Goal: Family would like pt to return home. PT Goal Formulation: With family  Time For Goal Achievement: 01/13/14 Potential to Achieve Goals: Fair    Frequency     Barriers to discharge        Co-evaluation               End of Session Equipment Utilized During Treatment: Oxygen Activity Tolerance: Patient limited by fatigue Patient left: in bed;with call bell/phone within reach;with bed alarm set           Time: 1139-1202 PT Time Calculation (min): 23 min   Charges:   PT Evaluation $Initial PT Evaluation Tier I: 1 Procedure PT Treatments $Therapeutic Activity: 8-22 mins   PT G Codes:          Bernestine Holsapple 01/06/2014, 2:59 PM  [facility] PT [phone]

## 2014-01-06 NOTE — Progress Notes (Addendum)
[id number] 10/26 ---  Patient had a urinary catheter placed in the emergency department.  During shift change, we noticed the patient was still urinating on the bed as well as through the catheter tubing.  Patient was complaining about it and the pee was blood tinged.  We removed that catheter a placed a new one.  We had yellow/green urine return through the tubing.  Assuming the blood tinge was damage from first placement of catheter. Will continue to monitor.    At 0330 10/27 -- . Patient family noticed some scant blood tinged discharge from area.  I did catheter care and cleaned her up.  Thinking this is from the first catheter placement. No urine noted coming out besides through the catheter tubing.

## 2014-01-06 NOTE — Progress Notes (Signed)
Patient Name: Yvette [name] Date of Encounter: 01/06/2014     Active Problems:   Flash pulmonary edema   Acute respiratory failure   Healthcare-associated pneumonia    SUBJECTIVE  Denies any chest discomfort. SOB improved. Daughter at bedside serve as Nurse, learning disabilitytranslator. Abdominal pain resolved. Did not sleep much last night.  CURRENT MEDS . aspirin EC  81 mg Oral Daily  . enoxaparin (LOVENOX) injection  40 mg Subcutaneous Q24H  . furosemide  40 mg Intravenous BID  . hydrALAZINE  50 mg Oral QID  . isosorbide mononitrate  15 mg Oral Daily  . [START ON 01/07/2014] levofloxacin  500 mg Oral Q48H  . pindolol  2.5 mg Oral QPM  . pindolol  5 mg Oral Daily  . sodium chloride  3 mL Intravenous Q12H  . URELLE  1 tablet Oral QID    OBJECTIVE  Filed Vitals:   01/05/14 2100 01/06/14 0207 01/06/14 0211 01/06/14 0656  BP:  156/103 159/77 119/87  Pulse: 94 89  89  Temp: 98.6 F (37 C) 99.3 F (37.4 C)  97.6 F (36.4 C)  TempSrc: Oral Oral  Oral  Resp: 18 21  19   Height:      Weight:    95 lb 0.3 oz (43.1 kg)  SpO2: 96% 97% 97% 97%    Intake/Output Summary (Last 24 hours) at 01/06/14 0745 Last data filed at 01/06/14 0706  Gross per 24 hour  Intake     53 ml  Output   3025 ml  Net  -2972 ml   Filed Weights   01/05/14 1300 01/05/14 1637 01/06/14 0656  Weight: 95 lb 14.4 oz (43.5 kg) 95 lb 7.4 oz (43.3 kg) 95 lb 0.3 oz (43.1 kg)    PHYSICAL EXAM  General: Pleasant, NAD. Neuro: Alert and oriented X 3. Moves all extremities spontaneously. Psych: Normal affect. HEENT:  Normal  Neck: Supple without bruits or JVD. Lungs:  Resp regular and unlabored, mild intermittent rale near R base Heart: RRR no s3, s4, or murmurs. Abdomen: Soft, non-tender, non-distended, BS + x 4.  Extremities: No clubbing, cyanosis or edema. DP/PT/Radials 2+ and equal bilaterally.  Accessory Clinical Findings  CBC  Recent Labs  01/05/14 0421 01/05/14 0437  WBC 11.1*  --   NEUTROABS 8.6*  --   HGB  11.1* 14.6  HCT 41.0 43.0  MCV 96.9  --   PLT 342  --    Basic Metabolic Panel  Recent Labs  01/05/14 0421 01/05/14 0437  NA 141 143  K 5.6* 5.3  CL 106 113*  CO2 17*  --   GLUCOSE 162* 163*  BUN 39* 43*  CREATININE 1.60* 1.70*  CALCIUM 9.2  --    Liver Function Tests  Recent Labs  01/05/14 0421  AST 32  ALT 18  ALKPHOS 64  BILITOT 0.4  PROT 9.0*  ALBUMIN 3.2*    Recent Labs  01/05/14 0421  LIPASE 118*   Cardiac Enzymes  Recent Labs  01/05/14 1047 01/05/14 1628 01/05/14 2210  TROPONINI 1.71* 1.56* 1.62*    TELE NSR with HR 80-90s    ECG  NSR with TWI in lateral leads, which appear to be present since 2014  Echocardiogram 11/10/2013  LV EF: 65% - 70%  ------------------------------------------------------------------- Indications: CHF - 428.0.  ------------------------------------------------------------------- History: PMH: Coronary artery disease. Congestive heart failure. Risk factors: Hypertension.  ------------------------------------------------------------------- Study Conclusions  - Left ventricle: The cavity size was normal. There was severe concentric hypertrophy. Systolic function was vigorous. The  estimated ejection fraction was in the range of 65% to 70%. Wall motion was normal; there were no regional wall motion abnormalities. Doppler parameters are consistent with abnormal left ventricular relaxation (grade 1 diastolic dysfunction). Doppler parameters are consistent with elevated ventricular end-diastolic filling pressure. - Aortic valve: Trileaflet; moderately thickened, moderately calcified leaflets. There was moderate regurgitation. Peak gradient (S): 11 mm Hg. Valve area (VTI): 1.74 cm^2. Valve area (Vmax): 1.78 cm^2. Valve area (Vmean): 1.85 cm^2. - Aortic root: The aortic root was normal in size. - Mitral valve: Calcified annulus. Mildly thickened leaflets . The findings are consistent with mild stenosis. There was  mild regurgitation. Valve area by continuity equation (using LVOT flow): 1.57 cm^2. - Left atrium: The atrium was mildly dilated. - Right ventricle: Systolic function was normal. - Tricuspid valve: There was mild regurgitation. - Pulmonary arteries: Systolic pressure was within the normal range. - Pericardium, extracardiac: A trivial pericardial effusion was identified posterior to the heart. Features were not consistent with tamponade physiology.  Impressions:  - Severe concentric LVH with small LV cavity. Abnormal relaxation with elevated filling pressures. Moderate aortic regurgitation and midl mitral stenosis. Normal RVSP.      Radiology/Studies  Ct Angio Chest Pe W/cm &/or Wo Cm  01/05/2014   CLINICAL DATA:  Abdominal pain, shortness of breath, wheezing.  EXAM: CT ANGIOGRAPHY CHEST, ABDOMEN AND PELVIS  TECHNIQUE: Multidetector CT imaging through the chest, abdomen and pelvis was performed using the standard protocol during bolus administration of intravenous contrast. Multiplanar reconstructed images and MIPs were obtained and reviewed to evaluate the vascular anatomy.  CONTRAST:  80 cc Omnipaque 350  COMPARISON:  07/26/2012  FINDINGS: CTA CHEST FINDINGS  Degraded by respiratory motion and streak artifact from extremity positioning. Within this limitation, no pulmonary embolism identified.  Tortuous thoracic aorta and branch vessels without aneurysmal dilatation.  Cardiomegaly.  Coronary artery calcifications.  No pericardial effusion.  Small bilateral pleural effusions.  Prominent mediastinal and right hilar lymph nodes measuring up to 1.3 cm short axis.  Bilateral ground-glass opacities and interlobular septal thickening. More confluent lung base opacities, favor atelectasis.  No pneumothorax. Limited airway evaluation due to respiratory motion. Grossly, central airways are patent.  Review of the MIP images confirms the above findings.  CTA ABDOMEN AND PELVIS FINDINGS  Scattered  atherosclerotic disease of the abdominal aorta and branch vessels. Aortic tortuosity without aneurysmal dilatation. Patent celiac axis, SMA, IMA, right renal artery. Diminutive left renal artery.  Organ evaluation is limited in the arterial phase. Within this limitation, hepatic cyst has a nonaggressive appearance. No appreciable acute abnormality of the spleen, biliary system, pancreas, adrenal glands. Atrophic left kidney. Chronic left renal pelvic dilatation versus prominent renal sinus cyst, similar to prior. No hydroureteronephrosis on the right.  Colonic diverticulosis. No CT evidence for colitis or diverticulitis. Appendix within normal limits. No right lower quadrant inflammation. Small bowel loops are of normal course and caliber. No free intraperitoneal air or fluid. No lymphadenopathy.  Foley catheter within a decompressed bladder. No appreciable abnormality of the uterus or adnexa.  Osteopenia. T11, T8, T5 compression fractures, with some interval progression at T5.  Review of the MIP images confirms the above findings.  IMPRESSION: No pulmonary embolism.  Cardiomegaly. Central vascular congestion. Ground-glass opacities and interlobular septal thickening, most in keeping with pulmonary edema.  Prominent mediastinal and right hilar lymph nodes are nonspecific and may be reactive.  Diffuse osteopenia and multilevel compression fractures including mild interval progression at T5. Correlate for point tenderness.  No  acute abdominopelvic process identified by CT.  Colonic diverticulosis.   Electronically Signed   By: Jearld LeschAndrew  DelGaizo M.D.   On: 01/05/2014 05:49   Ct Angio Abdomen W/cm &/or Wo Contrast  01/05/2014   CLINICAL DATA:  Abdominal pain, shortness of breath, wheezing.  EXAM: CT ANGIOGRAPHY CHEST, ABDOMEN AND PELVIS  TECHNIQUE: Multidetector CT imaging through the chest, abdomen and pelvis was performed using the standard protocol during bolus administration of intravenous contrast. Multiplanar  reconstructed images and MIPs were obtained and reviewed to evaluate the vascular anatomy.  CONTRAST:  80 cc Omnipaque 350  COMPARISON:  07/26/2012  FINDINGS: CTA CHEST FINDINGS  Degraded by respiratory motion and streak artifact from extremity positioning. Within this limitation, no pulmonary embolism identified.  Tortuous thoracic aorta and branch vessels without aneurysmal dilatation.  Cardiomegaly.  Coronary artery calcifications.  No pericardial effusion.  Small bilateral pleural effusions.  Prominent mediastinal and right hilar lymph nodes measuring up to 1.3 cm short axis.  Bilateral ground-glass opacities and interlobular septal thickening. More confluent lung base opacities, favor atelectasis.  No pneumothorax. Limited airway evaluation due to respiratory motion. Grossly, central airways are patent.  Review of the MIP images confirms the above findings.  CTA ABDOMEN AND PELVIS FINDINGS  Scattered atherosclerotic disease of the abdominal aorta and branch vessels. Aortic tortuosity without aneurysmal dilatation. Patent celiac axis, SMA, IMA, right renal artery. Diminutive left renal artery.  Organ evaluation is limited in the arterial phase. Within this limitation, hepatic cyst has a nonaggressive appearance. No appreciable acute abnormality of the spleen, biliary system, pancreas, adrenal glands. Atrophic left kidney. Chronic left renal pelvic dilatation versus prominent renal sinus cyst, similar to prior. No hydroureteronephrosis on the right.  Colonic diverticulosis. No CT evidence for colitis or diverticulitis. Appendix within normal limits. No right lower quadrant inflammation. Small bowel loops are of normal course and caliber. No free intraperitoneal air or fluid. No lymphadenopathy.  Foley catheter within a decompressed bladder. No appreciable abnormality of the uterus or adnexa.  Osteopenia. T11, T8, T5 compression fractures, with some interval progression at T5.  Review of the MIP images confirms  the above findings.  IMPRESSION: No pulmonary embolism.  Cardiomegaly. Central vascular congestion. Ground-glass opacities and interlobular septal thickening, most in keeping with pulmonary edema.  Prominent mediastinal and right hilar lymph nodes are nonspecific and may be reactive.  Diffuse osteopenia and multilevel compression fractures including mild interval progression at T5. Correlate for point tenderness.  No acute abdominopelvic process identified by CT.  Colonic diverticulosis.   Electronically Signed   By: Jearld LeschAndrew  DelGaizo M.D.   On: 01/05/2014 05:49   Dg Chest Port 1 View  01/05/2014   CLINICAL DATA:  Initial evaluation for hypoxia, shortness of breath.  EXAM: PORTABLE CHEST - 1 VIEW  COMPARISON:  Prior radiograph from 11/10/2013  FINDINGS: Cardiomegaly is stable from prior study. Mediastinal silhouette within normal limits. Atherosclerotic calcifications present within the aortic arch.  Lungs are normally inflated parent small bilateral pleural effusions are present. Diffuse interstitial thickening is most compatible with interstitial edema. Patchy bibasilar opacities most likely reflect edema and/ or atelectasis. No definite focal infiltrates. No pneumothorax.  Diffuse osteopenia present.  No acute osseous abnormality.  IMPRESSION: 1. Cardiomegaly with mild-to-moderate diffuse interstitial edema. 2. Small bilateral pleural effusions. 3. Mild patchy bibasilar opacities, most likely atelectasis and/or edema.   Electronically Signed   By: Rise MuBenjamin  McClintock M.D.   On: 01/05/2014 05:35    ASSESSMENT AND PLAN  1. Acute on chronic diastolic  HF  - Echo 11/10/2013 EF 65-70%, no regional wall motion abn, grade 1 diastolic dysfunc, moderate AR, mild MR,MS, TR.  - continue IV lasix 40mg  BID, change to PO tomorrow, currently -2.47L. Currently 95 lbs (was 88 lbs near discharge in Aug)  2. Acute resp failure  - 2/2 #1, BNP 9191. CXR consistent with pulm edema. CTA neg for PE. Lactic acid 5, trop  1.27  3. Abdominal pain: unclear cause, resolved  4. Elevated trop  - likely demand ischemia in the setting of acute resp insufficiency, CHF and known underlying CAD  - trop 1.71 --> 1.56 --> 1.62  No further workup given advanced age.  OK to change full dose lovenox to DVT dose  5. CAD (STEMI 2012, medical management) 6. CKD 7. PAF not on coumadin due to falls 8. H/o SSS, however able to tolerate low dose BB 9. HTN  Signed, Azalee Course PA-C Pager: [phone]  Patient seen and personally examined by me.  Agree with note as outlined by Azalee Course, PA with minor changes.  Continue medical management of CAD and demand ischemia given advanced age.  Continue IV Lasix today and reassess in the am.  Signed: Armanda Magic, MD [facility] 01/06/2014

## 2014-01-06 NOTE — Progress Notes (Signed)
Report given to receiving RN. Patient in bed resting, with family at bed side. No signs or symptoms of distress or discomfort noted.

## 2014-01-06 NOTE — Progress Notes (Signed)
Family Medicine Teaching Service Daily Progress Note Intern Pager: [phone]  Patient name: Yvette [name] Medical record number: [id number] Date of birth: 03/30/1916 Age: 80 y.o. Gender: female  Primary Care Provider: Maryjean KaStreet, Christopher, MD Consultants: cardiology Code Status: full  Pt Overview and Major Events to Date:  10/26: patient admitted for abd pain and dyspnea; Troponins pos, elevated lactic acid  Assessment and Plan: Yvette [name] is a 80 y.o. female with past medical history of CKD 3-4, CAD status post MI, dementia, hypertension, history of CVA, paroxysmal Aflutter, sick sinus syndrome presenting with abdominal pain and acute respiratory failure.   Acute respiratory failure secondary to acute CHF exacerbation: Patient hypoxic on admission with elevated BNP (9191). Chest xray and CTA findings consistent with pulmonary edema/acute CHF. CTA negative for PE. Per daughter no recent weight gain and patient is compliant with medications. Acute decompensation could be secondary to cardiac event given elevated troponin (1.27).  -Admit to SDU  -Continue BiPAP (respiratory to titrate)  -will transition to PO Lasix today.  -repeat Echo today. -cycle troponins  -Echo  -Consider palliative care consult   Abdominal Pain and Lactic Acidosis - Unclear etiology. Lactic acidosis likely secondary to volume depletion in setting of acute CHF exacerbation. Abdominal exam benign. Patient no longer c/o pain. -CT abdomen negative for acute findings  -Will trend lactic acid - 1.4 (10/27) -Serial abdominal exams  -Will monitor closely.   CAD, Sick Sinus Syndrome  -Continue Imdur and BB  -ASA 325 x 1; then 81 mg daily  -Continuing home Pindolol   Paroxysmal a-flutter: Sinus tach on EKG  -Monitor closely on Tele   CKD 4: Baseline 1.3-1.7; Stable currently.  -Will monitor closely in setting of diuresis.   HTN: BP 119/87  -Continue Hydralazine and Imdur   Hx of CVA  -ASA as above.   Urinary  retention: Foley catheter in place. Sees Dr Mena GoesEskridge outpatient    [facility]; Heart Healthy Diet Prophylaxis: Lovenox   Disposition: Home when deemed medically stable; possible DC tomorrow  Subjective:  Patient is in bed, no acute distress. Patient was noticeably weary for me. According to her family she thought I was going to "take her blood". Once I explained I wasn't she seemed to get more comfortable. No new issues today. Abdominal pain seems to have resolved.   Objective: Temp:  [97.6 F (36.4 C)-99.3 F (37.4 C)] 97.6 F (36.4 C) (10/27 0656) Pulse Rate:  [89-96] 93 (10/27 1100) Resp:  [18-21] 19 (10/27 0656) BP: (119-159)/(56-103) 124/56 mmHg (10/27 1100) SpO2:  [92 %-100 %] 97 % (10/27 0656) Weight:  [95 lb 0.3 oz (43.1 kg)-95 lb 14.4 oz (43.5 kg)] 95 lb 0.3 oz (43.1 kg) (10/27 14780656) Physical Exam: General: chronically ill appearing; laying in bed; alert, aware, communicating w/ family HEENT: NCAT.  Lungs: Lung sounds improved; very light bibasilar crackles noted  Heart: RRR, Soft systolic murmur (LSB). Abdomen: soft, nontender to palpation. Non distended. No rebound or guarding.  Extremities: No LE edema Neurologic: No focal deficits. Some difficulty due to language barrier.  Skin: warm, dry, intact.    Laboratory:  Recent Labs Lab 01/05/14 0421 01/05/14 0437  WBC 11.1*  --   HGB 11.1* 14.6  HCT 41.0 43.0  PLT 342  --     Recent Labs Lab 01/05/14 0421 01/05/14 0437  NA 141 143  K 5.6* 5.3  CL 106 113*  CO2 17*  --   BUN 39* 43*  CREATININE 1.60* 1.70*  CALCIUM 9.2  --  PROT 9.0*  --   BILITOT 0.4  --   ALKPHOS 64  --   ALT 18  --   AST 32  --   GLUCOSE 162* 163*   Lactic acid - 1.4 Urine culture - 50,000 E coli Troponins 1.49 >> 1.71 >> 1.56 >> 1.62  Imaging/Diagnostic Tests: CXR 10/26 IMPRESSION:  1. Cardiomegaly with mild-to-moderate diffuse interstitial edema.  2. Small bilateral pleural effusions.  3. Mild patchy bibasilar  opacities, most likely atelectasis and/or  edema.  CTA Abd and chest 10/26 IMPRESSION:  No pulmonary embolism.  Cardiomegaly. Central vascular congestion. Ground-glass opacities  and interlobular septal thickening, most in keeping with pulmonary  edema.  Prominent mediastinal and right hilar lymph nodes are nonspecific  and may be reactive.  Diffuse osteopenia and multilevel compression fractures including  mild interval progression at T5. Correlate for point tenderness.  No acute abdominopelvic process identified by CT.  Colonic diverticulosis.   Kathee DeltonIan D McKeag, MD 01/06/2014, 12:59 PM PGY-1, [facility] FPTS Intern pager: [phone], text pages welcome

## 2014-01-07 DIAGNOSIS — N184 Chronic kidney disease, stage 4 (severe): Secondary | ICD-10-CM

## 2014-01-07 DIAGNOSIS — R7989 Other specified abnormal findings of blood chemistry: Secondary | ICD-10-CM | POA: Diagnosis present

## 2014-01-07 DIAGNOSIS — R778 Other specified abnormalities of plasma proteins: Secondary | ICD-10-CM | POA: Diagnosis present

## 2014-01-07 MED ORDER — ENOXAPARIN SODIUM 30 MG/0.3ML ~~LOC~~ SOLN
20.0000 mg | SUBCUTANEOUS | Status: DC
  Administered 2014-01-07 – 2014-01-08 (×2): 20 mg via SUBCUTANEOUS
  Filled 2014-01-07 (×4): qty 0.2

## 2014-01-07 NOTE — Progress Notes (Signed)
    Subjective:  Family says she is better. Less SOB. She looks SOB at resst to me.  Objective:  Vital Signs in the last 24 hours: Temp:  [98 F (36.7 C)-99 F (37.2 C)] 98 F (36.7 C) (10/28 0544) Pulse Rate:  [83-93] 83 (10/28 0848) Resp:  [18] 18 (10/28 0544) BP: (100-172)/(56-88) 151/66 mmHg (10/28 0848) SpO2:  [93 %-98 %] 93 % (10/28 0544) Weight:  [91 lb 6.4 oz (41.459 kg)] 91 lb 6.4 oz (41.459 kg) (10/28 0544)  Intake/Output from previous day:  Intake/Output Summary (Last 24 hours) at 01/07/14 0935 Last data filed at 01/07/14 0546  Gross per 24 hour  Intake    340 ml  Output   1175 ml  Net   -835 ml    Physical Exam: General appearance: alert, cooperative and mild distress Lungs: decreased breath sounds, kyphosis Heart: regular rate and rhythm Extremities: no edema   Rate: 82  Rhythm: normal sinus rhythm  Lab Results:  Recent Labs  01/05/14 0421 01/05/14 0437  WBC 11.1*  --   HGB 11.1* 14.6  PLT 342  --     Recent Labs  01/05/14 0421 01/05/14 0437  NA 141 143  K 5.6* 5.3  CL 106 113*  CO2 17*  --   GLUCOSE 162* 163*  BUN 39* 43*  CREATININE 1.60* 1.70*    Recent Labs  01/05/14 1628 01/05/14 2210  TROPONINI 1.56* 1.62*    Recent Labs  01/05/14 0421  INR 1.07    Imaging: Imaging results have been reviewed  Cardiac Studies:  Assessment/Plan:  80 y/o Asian female with a history of chronic diastolic CHF, CAD with a history of STEMI in 2012- medical Rx., chronic renal insufficiency, PAF with SSS, and HTN. She was recently admitted 11/10/13 with acute on chronic diastolic CHF with respiratory failure. She was treated with diuretics and Bipap. Palliative Care consult was declined by the family. Echo showed her EF to be 65-70%. She was seen in the office 10/22 and was doing well. She presented to the ER 10/26 with abd pain and dyspnea. Her BNP was 9K. She has diuresed 3.2L and lost 4 lbs.     Principal Problem:   Acute respiratory  failure Active Problems:   Acute on chronic diastolic CHF (congestive heart failure) - she is approaching her dry weight   Chronic diastolic heart failure   Chronic kidney disease (CKD), stage IV (severe)   HTN (hypertension) - BP for most part is controlled although increased this am   CAD- STEMI 2012   Elevated troponin- demand ischemia   Paroxysmal atrial flutter -maintaining NSR   Sick sinus syndrome   Chronic indwelling Foley catheter   Impaired mobility and ADLs   Flash pulmonary edema   Healthcare-associated pneumonia    PLAN: Would document improvement in her CHF before discharge with follow up BNP and CXR, I ordered for tomorrow. She probably needs another day of IV Lasix. She should be a TCM early follow up pt at discharge.  Yvette ShelterLuke Kilroy PA-C [employer] [phone] 01/07/2014, 9:35 AM  Patient seen and personally examined by me.  Agree with note as outlined by Yvette ShelterLuke Kilroy, PA-C with minor changes.  She is approaching her dry weight of 88lbs.  Agree with 1 more day of OV Lasix and then change to PO.  Will recheck BNP and BMET in am.    Signed: Armanda Magicraci Eligh Rybacki, MD 01/07/2014

## 2014-01-07 NOTE — Progress Notes (Signed)
FMTS ATTENDING PROGRESS NOTE Yvette Heggs,MD I  have seen and examined this patient, reviewed their chart. I have discussed this patient with the inpatient resident.   Patient name: Yvette [name] Medical record number: [id number]  Date of birth: 03/23/1916 Age: 80 y.o. Gender: female   Primary Care Provider: Maryjean KaStreet, Christopher, MD  Consultants: cardiology  Code Status: full   Pt Overview and Major Events to Date:  10/26: patient admitted for abd pain and dyspnea; Troponins pos, elevated lactic acid   Assessment and Plan:  Yvette [name] is a 80 y.o. female with past medical history of CKD 3-4, CAD status post MI, dementia, hypertension, history of CVA, paroxysmal Aflutter, sick sinus syndrome presenting with abdominal pain and acute respiratory failure.   Acute respiratory failure secondary to acute CHF exacerbation with flash edema: Patient hypoxic on admission with elevated BNP (9191). Chest xray and CTA findings consistent with pulmonary edema/acute CHF. CTA negative for PE. Per daughter no recent weight gain and patient is compliant with medications. Acute decompensation could be secondary to cardiac event given elevated troponin (1.27).   -S/P BiPAP now doing well on room air since last night. -Cardiology recommends one more day of IV lasix, transition to PO tomorrow. -repeat ECHO reviewed   Abdominal Pain and Lactic Acidosis - Unclear etiology. Lactic acidosis likely secondary to volume depletion in setting of acute CHF exacerbation. Abdominal exam benign. Patient no longer c/o pain.  -CT abdomen negative for acute findings  -Will trend lactic acid - 1.4 (10/27)  -Abdominal exam benign today. -Will monitor closely.   CAD, Sick Sinus Syndrome  -Continue Imdur and BB  -ASA 325 x 1; then 81 mg daily  -Continuing home Pindolol  - To discuss starting Statin with family.  Paroxysmal a-flutter: Sinus tach on EKG  -Monitor closely on Tele  - Not a candidate for anticoagulant due to fall  risk. -continue DVT prophylaxis.  CKD 4: Baseline 1.3-1.7; Stable currently.  -Will monitor closely in setting of diuresis. -BMET tomorrow.   HTN: BP 119/87  -Continue Hydralazine and Imdur   Hx of CVA  -ASA as above.  - Discuss Statin with family.  Urinary retention: Foley catheter in place. Sees Dr Mena GoesEskridge outpatient  - Started on [place] here which caused urine discoloration to bluish green. - Hold Urelle for now. -Urine color improved after I went in to see her 30 min later. - Urine Cx showed Ecoli with [id number] colony, sensitivity pending but. Continue Levaquin.  FEN/GI: KVO IVF; Heart Healthy Diet  Prophylaxis: Lovenox   Disposition: Home when deemed medically stable; possible DC tomorrow   Subjective:  Patient is doing well today, denies any complaints, her grandson however is concern about her greenish urine since admitted to the hospital, denies any dysuria or stomach pain.  Objective:  Filed Vitals:   01/06/14 1753 01/06/14 2039 01/07/14 0544 01/07/14 0848  BP: 134/72 103/85 172/88 151/66  Pulse: 90 88 86 83  Temp:  99 F (37.2 C) 98 F (36.7 C)   TempSrc:  Oral Oral   Resp:  18 18   Height:      Weight:   91 lb 6.4 oz (41.459 kg)   SpO2:  98% 93%     Physical Exam:  General: Awake and alert, not in distress. HEENT: NCAT.  Lungs:Air entry equal B/L, mild bibasilar crackles.  Heart: RRR, S1 S2 normal. Abdomen: soft, nontender to palpation. Non distended. No rebound or guarding.  Extremities: No LE edema Neurologic: No focal deficits.  Skin: warm, dry, intact.   Labs: Results for Yvette [name], Yvette [name] (MRN [id number]) as of 01/07/2014 11:40  Ref. Range 01/05/2014 04:37 01/05/2014 06:57 01/05/2014 10:47 01/05/2014 16:28 01/05/2014 22:10  TCO2 Latest Range: 0-100 mmol/L 18      Sodium Latest Range: 137-147 mEq/L 143      Potassium Latest Range: 3.7-5.3 mEq/L 5.3      Chloride Latest Range: 96-112 mEq/L 113 (H)      BUN Latest Range: 6-23 mg/dL 43 (H)       Creatinine Latest Range: 0.50-1.10 mg/dL 9.521.70 (H)      Glucose Latest Range: 70-99 mg/dL 841163 (H)      Calcium Ionized Latest Range: 1.13-1.30 mmol/L 1.15      Troponin I Latest Range: <0.30 ng/mL   1.71 (HH) 1.56 (HH) 1.62 (HH)  Lactic Acid, Venous Latest Range: 0.5-2.2 mmol/L 5.03 (H) 1.4     Hemoglobin Latest Range: 12.0-15.0 g/dL 32.414.6      HCT Latest Range: 36.0-46.0 % 43.0       Imaging/Diagnostic Tests:  CXR 10/26  IMPRESSION:  1. Cardiomegaly with mild-to-moderate diffuse interstitial edema.  2. Small bilateral pleural effusions.  3. Mild patchy bibasilar opacities, most likely atelectasis and/or  edema.  CTA Abd and chest 10/26  IMPRESSION:  No pulmonary embolism.  Cardiomegaly. Central vascular congestion. Ground-glass opacities  and interlobular septal thickening, most in keeping with pulmonary  edema.  Prominent mediastinal and right hilar lymph nodes are nonspecific  and may be reactive.  Diffuse osteopenia and multilevel compression fractures including  mild interval progression at T5. Correlate for point tenderness.  No acute abdominopelvic process identified by CT.  Colonic diverticulosis.

## 2014-01-08 ENCOUNTER — Inpatient Hospital Stay (HOSPITAL_COMMUNITY): Payer: Medicaid Other

## 2014-01-08 DIAGNOSIS — R1084 Generalized abdominal pain: Secondary | ICD-10-CM

## 2014-01-08 DIAGNOSIS — R109 Unspecified abdominal pain: Secondary | ICD-10-CM

## 2014-01-08 DIAGNOSIS — I509 Heart failure, unspecified: Secondary | ICD-10-CM

## 2014-01-08 LAB — BASIC METABOLIC PANEL
Anion gap: 20 — ABNORMAL HIGH (ref 5–15)
BUN: 68 mg/dL — ABNORMAL HIGH (ref 6–23)
CO2: 21 mEq/L (ref 19–32)
Calcium: 8.8 mg/dL (ref 8.4–10.5)
Chloride: 101 mEq/L (ref 96–112)
Creatinine, Ser: 2.65 mg/dL — ABNORMAL HIGH (ref 0.50–1.10)
GFR calc Af Amer: 17 mL/min — ABNORMAL LOW (ref >90)
GFR calc non Af Amer: 14 mL/min — ABNORMAL LOW (ref >90)
Glucose, Bld: 132 mg/dL — ABNORMAL HIGH (ref 70–99)
Potassium: 3.9 mEq/L (ref 3.7–5.3)
Sodium: 142 mEq/L (ref 137–147)

## 2014-01-08 LAB — URINE CULTURE

## 2014-01-08 LAB — PRO B NATRIURETIC PEPTIDE: Pro B Natriuretic peptide (BNP): 8164 pg/mL — ABNORMAL HIGH (ref 0–450)

## 2014-01-08 MED ORDER — TRAZODONE 25 MG HALF TABLET
25.0000 mg | ORAL_TABLET | Freq: Every day | ORAL | Status: DC
  Administered 2014-01-08: 25 mg via ORAL
  Filled 2014-01-08 (×2): qty 1

## 2014-01-08 NOTE — Progress Notes (Signed)
UR completed Darwyn Ponzo K. Rehan Holness, RN, BSN, MSHL, CCM  01/08/2014 3:27 PM

## 2014-01-08 NOTE — Progress Notes (Signed)
Patient name: Yvette [name] Medical record number: [id number]  Date of birth: 05/09/1916 Age: 80 y.o. Gender: female   Primary Care Provider: Maryjean KaStreet, Christopher, MD  Consultants: cardiology  Code Status: full   Pt Overview and Major Events to Date:  10/26: patient admitted for abd pain and dyspnea; Troponins pos, elevated lactic acid   Assessment and Plan:  Yvette [name] is a 80 y.o. female with past medical history of CKD 3-4, CAD status post MI, dementia, hypertension, history of CVA, paroxysmal Aflutter, sick sinus syndrome presenting with abdominal pain and acute respiratory failure.   Acute respiratory failure secondary to acute CHF exacerbation with flash edema: Patient hypoxic on admission with elevated BNP (9191). Chest xray and CTA findings consistent with pulmonary edema/acute CHF. CTA negative for PE. Per daughter no recent weight gain and patient is compliant with medications. Acute decompensation could be secondary to cardiac event given elevated troponin (1.27).   -S/P BiPAP now doing well on room air since last night. -Cardiology - holding Lasix today; recheck BMP in AM; start PO lasix tomorrow - CXR 10/29 - mild vascular congestion; No pulm edema -repeat ECHO reviewed   Abdominal Pain and Lactic Acidosis - Unclear etiology. Lactic acidosis likely secondary to volume depletion in setting of acute CHF exacerbation. Abdominal exam benign. Patient no longer c/o pain.  -CT abdomen negative for acute findings  -Will trend lactic acid - 1.4 (10/27)  -Abdominal exam benign today. -Will monitor closely.   CAD, Sick Sinus Syndrome  -Continue Imdur and BB  -ASA 325 x 1; then 81 mg daily  -Continuing home Pindolol  - To discuss starting Statin with family.  Paroxysmal a-flutter: Sinus tach on EKG  -Monitor closely on Tele  - Not a candidate for anticoagulant due to fall risk. -continue DVT prophylaxis.  CKD 4: Baseline 1.3-1.7; large Cr jump (1.70 >> 2.65) -holding lasix; gentle  fluids; BMP tomorrow -Will monitor closely in setting of diuresis.   HTN: BP 119/87  -Continue Hydralazine and Imdur   Hx of CVA  -ASA as above.  - Discuss Statin with family.  Urinary retention: Foley catheter in place. Sees Dr Mena GoesEskridge outpatient  - Started on [place] here which caused urine discoloration to bluish green. - Hold Urelle for now. -Urine color improved - Urine Cx showed Ecoli with [id number] colony, sensitivity pending but. Continue Levaquin.  FEN/GI: KVO IVF; Heart Healthy Diet  Prophylaxis: Lovenox   Disposition: Home when deemed medically stable; possible DC tomorrow   Subjective:  Patient is doing well today, denies any complaints, no new pain. Patient states she is ready to go home.  Objective:  Filed Vitals:   01/08/14 0440 01/08/14 0925 01/08/14 1138 01/08/14 1330  BP: 134/76 140/74 134/52 120/53  Pulse: 83 81 76 77  Temp: 98 F (36.7 C) 98 F (36.7 C) 98 F (36.7 C) 98.6 F (37 C)  TempSrc: Oral Oral Oral Oral  Resp: 22 20 20 18   Height:      Weight: 88 lb 2.9 oz (40 kg)     SpO2: 97% 99% 99% 93%    Physical Exam:  General: Awake and alert, not in distress. HEENT: NCAT.  Lungs:Air entry equal B/L, mild bibasilar crackles.  Heart: RRR, S1 S2 normal. Abdomen: soft, nontender to palpation. Non distended. No rebound or guarding.  Extremities: No LE edema Neurologic: No focal deficits.  Skin: warm, dry, intact.   Labs: Results for [name], [name] (MRN [id number]) as of 01/07/2014 11:40  Ref. Range 01/05/2014 04:37 01/05/2014  06:57 01/05/2014 10:47 01/05/2014 16:28 01/05/2014 22:10  TCO2 Latest Range: 0-100 mmol/L 18      Sodium Latest Range: 137-147 mEq/L 143      Potassium Latest Range: 3.7-5.3 mEq/L 5.3      Chloride Latest Range: 96-112 mEq/L 113 (H)      BUN Latest Range: 6-23 mg/dL 43 (H)      Creatinine Latest Range: 0.50-1.10 mg/dL 4.091.70 (H)      Glucose Latest Range: 70-99 mg/dL 811163 (H)      Calcium Ionized Latest Range: 1.13-1.30 mmol/L  1.15      Troponin I Latest Range: <0.30 ng/mL   1.71 (HH) 1.56 (HH) 1.62 (HH)  Lactic Acid, Venous Latest Range: 0.5-2.2 mmol/L 5.03 (H) 1.4     Hemoglobin Latest Range: 12.0-15.0 g/dL 91.414.6      HCT Latest Range: 36.0-46.0 % 43.0       Imaging/Diagnostic Tests:  CXR 10/26  IMPRESSION:  1. Cardiomegaly with mild-to-moderate diffuse interstitial edema.  2. Small bilateral pleural effusions.  3. Mild patchy bibasilar opacities, most likely atelectasis and/or  edema.   CTA Abd and chest 10/26  IMPRESSION:  No pulmonary embolism.  Cardiomegaly. Central vascular congestion. Ground-glass opacities  and interlobular septal thickening, most in keeping with pulmonary  edema.  Prominent mediastinal and right hilar lymph nodes are nonspecific  and may be reactive.  Diffuse osteopenia and multilevel compression fractures including  mild interval progression at T5. Correlate for point tenderness.  No acute abdominopelvic process identified by CT.  Colonic diverticulosis.  CXR 10/29 IMPRESSION:  No segmental infiltrate. Cardiomegaly again noted. Central mild  vascular congestion without convincing pulmonary edema. Mild  interstitial prominence probable chronic. Osteopenia and  degenerative changes thoracic spine. Stable compression deformities  thoracic spine    Kathee DeltonIan D McKeag, MD,MS,  PGY1 01/08/2014 3:19 PM

## 2014-01-08 NOTE — Progress Notes (Signed)
SUBJECTIVE:  No complaints  OBJECTIVE:   Vitals:   Filed Vitals:   01/07/14 1413 01/07/14 1821 01/07/14 2144 01/08/14 0440  BP: 121/54 143/72 127/60 134/76  Pulse: 81 86 80 83  Temp: 98.6 F (37 C)  98.2 F (36.8 C) 98 F (36.7 C)  TempSrc: Oral  Oral Oral  Resp: 18  20 22   Height:      Weight:    88 lb 2.9 oz (40 kg)  SpO2: 92%  96% 97%   I&O's:   Intake/Output Summary (Last 24 hours) at 01/08/14 [id number] Last data filed at 01/08/14 [id number]  Gross per 24 hour  Intake    240 ml  Output   1362 ml  Net  -1122 ml   TELEMETRY: Reviewed telemetry pt in NSR     PHYSICAL EXAM General: Well developed, well nourished, in no acute distress Head: Eyes PERRLA, No xanthomas.   Normal cephalic and atramatic  Lungs:   Clear bilaterally to auscultation and percussion. Heart:   HRRR S1 S2 Pulses are 2+ & equal. Abdomen: Bowel sounds are positive, abdomen soft and non-tender without masses Extremities:   No clubbing, cyanosis or edema.  DP +1 Neuro: Alert and oriented X 3. Psych:  Good affect, responds appropriately   LABS: Basic Metabolic Panel:  Recent Labs  70/35/0010/29/15 0610  NA 142  K 3.9  CL 101  CO2 21  GLUCOSE 132*  BUN 68*  CREATININE 2.65*  CALCIUM 8.8   Liver Function Tests: No results found for this basename: AST, ALT, ALKPHOS, BILITOT, PROT, ALBUMIN,  in the last 72 hours No results found for this basename: LIPASE, AMYLASE,  in the last 72 hours CBC: No results found for this basename: WBC, NEUTROABS, HGB, HCT, MCV, PLT,  in the last 72 hours Cardiac Enzymes:  Recent Labs  01/05/14 1047 01/05/14 1628 01/05/14 2210  TROPONINI 1.71* 1.56* 1.62*   BNP: No components found with this basename: POCBNP,  D-Dimer: No results found for this basename: DDIMER,  in the last 72 hours Hemoglobin A1C: No results found for this basename: HGBA1C,  in the last 72 hours Fasting Lipid Panel: No results found for this basename: CHOL, HDL, LDLCALC, TRIG, CHOLHDL, LDLDIRECT,   in the last 72 hours Thyroid Function Tests: No results found for this basename: TSH, T4TOTAL, FREET3, T3FREE, THYROIDAB,  in the last 72 hours Anemia Panel: No results found for this basename: VITAMINB12, FOLATE, FERRITIN, TIBC, IRON, RETICCTPCT,  in the last 72 hours Coag Panel:   Lab Results  Component Value Date   INR 1.07 01/05/2014   INR 1.14 11/10/2013   INR 0.95 09/24/2012    RADIOLOGY: Ct Angio Chest Pe W/cm &/or Wo Cm  01/05/2014   CLINICAL DATA:  Abdominal pain, shortness of breath, wheezing.  EXAM: CT ANGIOGRAPHY CHEST, ABDOMEN AND PELVIS  TECHNIQUE: Multidetector CT imaging through the chest, abdomen and pelvis was performed using the standard protocol during bolus administration of intravenous contrast. Multiplanar reconstructed images and MIPs were obtained and reviewed to evaluate the vascular anatomy.  CONTRAST:  80 cc Omnipaque 350  COMPARISON:  07/26/2012  FINDINGS: CTA CHEST FINDINGS  Degraded by respiratory motion and streak artifact from extremity positioning. Within this limitation, no pulmonary embolism identified.  Tortuous thoracic aorta and branch vessels without aneurysmal dilatation.  Cardiomegaly.  Coronary artery calcifications.  No pericardial effusion.  Small bilateral pleural effusions.  Prominent mediastinal and right hilar lymph nodes measuring up to 1.3 cm short axis.  Bilateral ground-glass  opacities and interlobular septal thickening. More confluent lung base opacities, favor atelectasis.  No pneumothorax. Limited airway evaluation due to respiratory motion. Grossly, central airways are patent.  Review of the MIP images confirms the above findings.  CTA ABDOMEN AND PELVIS FINDINGS  Scattered atherosclerotic disease of the abdominal aorta and branch vessels. Aortic tortuosity without aneurysmal dilatation. Patent celiac axis, SMA, IMA, right renal artery. Diminutive left renal artery.  Organ evaluation is limited in the arterial phase. Within this limitation,  hepatic cyst has a nonaggressive appearance. No appreciable acute abnormality of the spleen, biliary system, pancreas, adrenal glands. Atrophic left kidney. Chronic left renal pelvic dilatation versus prominent renal sinus cyst, similar to prior. No hydroureteronephrosis on the right.  Colonic diverticulosis. No CT evidence for colitis or diverticulitis. Appendix within normal limits. No right lower quadrant inflammation. Small bowel loops are of normal course and caliber. No free intraperitoneal air or fluid. No lymphadenopathy.  Foley catheter within a decompressed bladder. No appreciable abnormality of the uterus or adnexa.  Osteopenia. T11, T8, T5 compression fractures, with some interval progression at T5.  Review of the MIP images confirms the above findings.  IMPRESSION: No pulmonary embolism.  Cardiomegaly. Central vascular congestion. Ground-glass opacities and interlobular septal thickening, most in keeping with pulmonary edema.  Prominent mediastinal and right hilar lymph nodes are nonspecific and may be reactive.  Diffuse osteopenia and multilevel compression fractures including mild interval progression at T5. Correlate for point tenderness.  No acute abdominopelvic process identified by CT.  Colonic diverticulosis.   Electronically Signed   By: Jearld LeschAndrew  DelGaizo M.D.   On: 01/05/2014 05:49   Ct Angio Abdomen W/cm &/or Wo Contrast  01/05/2014   CLINICAL DATA:  Abdominal pain, shortness of breath, wheezing.  EXAM: CT ANGIOGRAPHY CHEST, ABDOMEN AND PELVIS  TECHNIQUE: Multidetector CT imaging through the chest, abdomen and pelvis was performed using the standard protocol during bolus administration of intravenous contrast. Multiplanar reconstructed images and MIPs were obtained and reviewed to evaluate the vascular anatomy.  CONTRAST:  80 cc Omnipaque 350  COMPARISON:  07/26/2012  FINDINGS: CTA CHEST FINDINGS  Degraded by respiratory motion and streak artifact from extremity positioning. Within this  limitation, no pulmonary embolism identified.  Tortuous thoracic aorta and branch vessels without aneurysmal dilatation.  Cardiomegaly.  Coronary artery calcifications.  No pericardial effusion.  Small bilateral pleural effusions.  Prominent mediastinal and right hilar lymph nodes measuring up to 1.3 cm short axis.  Bilateral ground-glass opacities and interlobular septal thickening. More confluent lung base opacities, favor atelectasis.  No pneumothorax. Limited airway evaluation due to respiratory motion. Grossly, central airways are patent.  Review of the MIP images confirms the above findings.  CTA ABDOMEN AND PELVIS FINDINGS  Scattered atherosclerotic disease of the abdominal aorta and branch vessels. Aortic tortuosity without aneurysmal dilatation. Patent celiac axis, SMA, IMA, right renal artery. Diminutive left renal artery.  Organ evaluation is limited in the arterial phase. Within this limitation, hepatic cyst has a nonaggressive appearance. No appreciable acute abnormality of the spleen, biliary system, pancreas, adrenal glands. Atrophic left kidney. Chronic left renal pelvic dilatation versus prominent renal sinus cyst, similar to prior. No hydroureteronephrosis on the right.  Colonic diverticulosis. No CT evidence for colitis or diverticulitis. Appendix within normal limits. No right lower quadrant inflammation. Small bowel loops are of normal course and caliber. No free intraperitoneal air or fluid. No lymphadenopathy.  Foley catheter within a decompressed bladder. No appreciable abnormality of the uterus or adnexa.  Osteopenia. T11, T8, T5  compression fractures, with some interval progression at T5.  Review of the MIP images confirms the above findings.  IMPRESSION: No pulmonary embolism.  Cardiomegaly. Central vascular congestion. Ground-glass opacities and interlobular septal thickening, most in keeping with pulmonary edema.  Prominent mediastinal and right hilar lymph nodes are nonspecific and may  be reactive.  Diffuse osteopenia and multilevel compression fractures including mild interval progression at T5. Correlate for point tenderness.  No acute abdominopelvic process identified by CT.  Colonic diverticulosis.   Electronically Signed   By: Jearld Lesch M.D.   On: 01/05/2014 05:49   Dg Chest Port 1 View  01/05/2014   CLINICAL DATA:  Initial evaluation for hypoxia, shortness of breath.  EXAM: PORTABLE CHEST - 1 VIEW  COMPARISON:  Prior radiograph from 11/10/2013  FINDINGS: Cardiomegaly is stable from prior study. Mediastinal silhouette within normal limits. Atherosclerotic calcifications present within the aortic arch.  Lungs are normally inflated parent small bilateral pleural effusions are present. Diffuse interstitial thickening is most compatible with interstitial edema. Patchy bibasilar opacities most likely reflect edema and/ or atelectasis. No definite focal infiltrates. No pneumothorax.  Diffuse osteopenia present.  No acute osseous abnormality.  IMPRESSION: 1. Cardiomegaly with mild-to-moderate diffuse interstitial edema. 2. Small bilateral pleural effusions. 3. Mild patchy bibasilar opacities, most likely atelectasis and/or edema.   Electronically Signed   By: Rise Mu M.D.   On: 01/05/2014 05:35   Principal Problem:  Acute respiratory failure  Active Problems:  Acute on chronic diastolic CHF (congestive heart failure) - she is at her dry weight  Chronic diastolic heart failure  Chronic kidney disease (CKD), stage IV (severe) - creatinine has bumped today most likely from diuresis. HTN (hypertension) - BP controlled CAD- STEMI 2012  Elevated troponin- demand ischemia  Paroxysmal atrial flutter -maintaining NSR  Sick sinus syndrome  Chronic indwelling Foley catheter  Impaired mobility and ADLs  Flash pulmonary edema - resolved Healthcare-associated pneumonia   PLAN: BNP is still elevated but clinically she appears euvolemic.  She is back to her dry weight at  88lbs and lungs are clear.  She has no LE edema.  Her BNP is elevated but trending down and almost where it was at time of discharge at late August admission.  Her creatinine has increased today most likely from diuersis.  Would hold Lasix today and recheck BMET in am and if creatinine trending downward then would restart PO Lasix.  Continue ASA/nitrates/BB/hydralazine.  Will need early followup in our clinic with one of our PA's for TCM.   Quintella Reichert, MD  01/08/2014  8:28 AM

## 2014-01-08 NOTE — Progress Notes (Signed)
FMTS Attending Note Patient reports feeling well, no dyspnea and no abdominal pain. Her nephew serves as interpreter for our visit.  Given her abrupt bump in creatinine, plan to hold furosemide and recheck Cr in morning before decision to discharge.  Will require close follow up of fluid status and renal function in the office after discharge, which may be as soon as tomorrow.  Paula ComptonJames Zacharie Portner, MD

## 2014-01-08 NOTE — Progress Notes (Signed)
Pt family member stated that the pt does not wear BiPAP and does not want to wear BiPAP tonight.

## 2014-01-08 NOTE — Progress Notes (Signed)
Radiology up to take pt down for CXR 2 view as ordered.  Pt's grandson says that cardiologist was in the room earlier and said she does not need CXR.  Dr. Woodroe ModeMcKeay informed and instructed it's ok for pt to have CXR as ordered and so can go off floor for test.  Informed pt's grandson. Who's at bedside and stated "that's ok for her to go ."  Irving Burtonmily in radiology notified and said someone will come get her.  Amanda PeaNellie Nigil Braman, Charity fundraiserN.

## 2014-01-09 LAB — BASIC METABOLIC PANEL
Anion gap: 16 — ABNORMAL HIGH (ref 5–15)
BUN: 79 mg/dL — AB (ref 6–23)
CO2: 23 meq/L (ref 19–32)
Calcium: 9 mg/dL (ref 8.4–10.5)
Chloride: 108 mEq/L (ref 96–112)
Creatinine, Ser: 2.61 mg/dL — ABNORMAL HIGH (ref 0.50–1.10)
GFR calc non Af Amer: 15 mL/min — ABNORMAL LOW (ref >90)
GFR, EST AFRICAN AMERICAN: 17 mL/min — AB (ref >90)
Glucose, Bld: 84 mg/dL (ref 70–99)
Potassium: 4.7 mEq/L (ref 3.7–5.3)
Sodium: 147 mEq/L (ref 137–147)

## 2014-01-09 MED ORDER — LEVOFLOXACIN 500 MG PO TABS
500.0000 mg | ORAL_TABLET | ORAL | Status: DC
Start: 2014-01-09 — End: 2014-03-06

## 2014-01-09 MED ORDER — TORSEMIDE 20 MG PO TABS: 20.0000 mg | ORAL_TABLET | Freq: Every day | ORAL | Status: DC

## 2014-01-09 NOTE — Discharge Summary (Signed)
FMTS Attending Note Patient seen and examined by me, discussed with resident team and I agree with Dr Alben SpittleMcKeag's note for today.  Patient remains asymptomatic with regard to the abdominal pain and dyspnea she had on presentation.  She and family are eager for her to return home.  Her Cr remains stable but elevated.  I agree with Cardiology plan to hold Demadex until Sunday and then resume three times/week regimen then.  Close outpatient follow up of her renal function and fluid status. For home discharge today. Paula ComptonJames Ma Munoz, MD

## 2014-01-09 NOTE — Discharge Instructions (Signed)
Please do not take your your Demadex until Sunday, November 1. After that, resume taking your Demadex every Tuesday Thursday and Saturday  ?au b?ng (Abdominal Pain) C nhi?u nguyn nhn d?n ??n ?au b?ng. Thng th??ng ?au b?ng l do m?t b?nh gy ra v s? khng ?? n?u khng ?i?u tr?Marland Kitchen. B?nh ny c th? ???c theo di v ?i?u tr? t?i nh. Chuyn gia ch?m [place] s?c kh?e s? ti?n hnh khm th?c th? v c th? yu c?u lm xt nghi?m mu v ch?p X quang ?? xc ??nh m?c ?? nghim tr?ng c?a c?n ?au b?ng. Tuy nhin, trong nhi?u tr??ng h?p, ph?i m?t nhi?u th?i gian h?n ?? xc ??nh r nguyn nhn gy ?au b?ng. Tr??c khi tm ra nguyn nhn, chuyn gia ch?m [place] s?c kh?e c th? khng bi?t li?u qu v? c c?n lm thm xt nghi?m ho?c ti?p t?c ?i?u tr? hay khng. H??NG D?N CH?M [place] T?I [place]  Theo di c?n ?au b?ng xem c b?t k? thay ??i no khng. Nh?ng hnh ??ng sau c th? gip lo?i b? b?t c? c?m gic kh ch?u no qu v? ?ang b?.  Ch? s? d?ng thu?c khng c?n k ??n ho?c thu?c c?n k ??n theo ch? d?n c?a chuyn gia ch?m [place] s?c kh?e.  Khng dng thu?c nhu?n trng tr? khi ???c chuyn gia ch?m [name] s?c kh?e ch? ??nh.  Th? dng m?t ch? ?? ?n l?ng (n??c lu?c th?t, tr, ho?c n??c) theo ch? d?n c?a chuyn gia ch?m [place] s?c kh?e. Chuy?n d?n sang m?t ch? ?? ?n nh? n?u ch?u ???c. ?I [facility] N?U:  Qu v? b? ?au b?ng khng r nguyn nhn.  Qu v? b? ?au b?ng km theo bu?n nn ho?c tiu ch?y.  Qu v? b? ?au khi ?i ti?u ho?c ?i ngoi.  Qu v? b? c?n ?au b?ng lm th?c gi?c vo ban ?m.  Qu v? b? ?au b?ng n?ng thm ho?c ?? h?n khi ?n.  Qu v? b? ?au b?ng n?ng thm khi ?n ?? ?n nhi?u ch?t bo.  Qu v? b? s?t. NGAY L?P T?C ?I [facility] N?U:   C?n ?au khng kh?i trong vng 2 gi?Ladell Heads.  Qu v? v?n ti?p t?c b? i (nn m?a).  Ch? c th? c?m th?y ?au ? m?t s? ph?n b?ng, ch?ng h?n nh? ? ph?n b?ng bn ph?i ho?c bn d??i tri.  Phn c?a qu v? c mu ho?c c mu ?en nh? h?c n. ??M B?O QU V?:  Hi?u r cc h??ng d?n ny.  S? theo di tnh  tr?ng c?a mnh.  S? yu c?u tr? gip ngay l?p t?c n?u qu v? c?m th?y khng kh?e ho?c th?y tr?m tr?ng h?n. Document Released: 02/27/2005 Document Revised: 03/04/2013 [facility] Patient Information 2015 [employer], [place]. This information is not intended to replace advice given to you by your health care provider. Make sure you discuss any questions you have with your health care provider.

## 2014-01-09 NOTE — Progress Notes (Signed)
    Subjective:  SOB improved.  Objective:  Vital Signs in the last 24 hours: Temp:  [97.5 F (36.4 C)-98.6 F (37 C)] 97.5 F (36.4 C) (10/30 0413) Pulse Rate:  [74-81] 80 (10/30 0413) Resp:  [18-20] 20 (10/30 0413) BP: (120-148)/(52-74) 140/74 mmHg (10/30 0413) SpO2:  [93 %-99 %] 94 % (10/30 0413) Weight:  [89 lb 4.6 oz (40.5 kg)] 89 lb 4.6 oz (40.5 kg) (10/30 0413)  Intake/Output from previous day:  Intake/Output Summary (Last 24 hours) at 01/09/14 0756 Last data filed at 01/09/14 0630  Gross per 24 hour  Intake    270 ml  Output    920 ml  Net   -650 ml    Physical Exam: General appearance: alert, cooperative, no distress and elderly, frail Lungs: basilar carckles Heart: regular rate and rhythm   Rate: 80  Rhythm: normal sinus rhythm  Lab Results: No results found for this basename: WBC, HGB, PLT,  in the last 72 hours  Recent Labs  01/08/14 0610 01/09/14 0340  NA 142 147  K 3.9 4.7  CL 101 108  CO2 21 23  GLUCOSE 132* 84  BUN 68* 79*  CREATININE 2.65* 2.61*   No results found for this basename: TROPONINI, CK, MB,  in the last 72 hours No results found for this basename: INR,  in the last 72 hours  Imaging: Imaging results have been reviewed  Cardiac Studies:  Assessment/Plan:  80 y/o [place] female with a history of chronic diastolic CHF, CAD with a history of STEMI in 2012- medical Rx., chronic renal insufficiency, PAF with SSS, and HTN. She was recently admitted 11/10/13 with acute on chronic diastolic CHF with respiratory failure. She was treated with diuretics and Bipap. Palliative Care consult was declined by the family. Echo showed her EF to be 65-70%. She was seen in the office 10/22 and was doing well. She presented to the ER 10/26 with abd pain and dyspnea. Her BNP was 9K. She has diuresed 5L and lost 6 lbs.     Principal Problem:   Acute respiratory failure Active Problems:   Acute on chronic diastolic CHF (congestive heart failure)  Chronic diastolic heart failure   Chronic kidney disease (CKD), stage IV (severe)   HTN (hypertension)   CAD- STEMI 2012   Elevated troponin- demand ischemia   Paroxysmal atrial flutter   Sick sinus syndrome   Chronic indwelling Foley catheter   Impaired mobility and ADLs   Flash pulmonary edema   Healthcare-associated pneumonia    PLAN: Her SCr is slightly lower- 2.61. Should be OK for discharge- will discuss diuretic dose with MD-home dose was Demadex 20 mg 3 x week. TCM early f/u will be arranged.   Corine ShelterLuke Kilroy PA-C [facility] [phone] 01/09/2014, 7:56 AM   I have personally seen and examined patient and agree with note as outlined above.  Creatinine still bumped so would hold diuretics again today.  Ok to discharge home and would restart demadex on Sunday.  Needs followup with Cardiology early next week to assess further diuretic regimen.  For now would restart Demadex on prior home regimen but instead on Sunday/Tuesday and Thursdays.  Please have her followup with Dr. Antoine PocheHochrein early next week  Signed: Armanda Magicraci Yoshino Broccoli, MD [facility] 01/09/2014

## 2014-01-09 NOTE — Progress Notes (Signed)
D/C'd tele.  D/c'd IV.  D/C'd pt.  Per MD order, left indwelling foley in place, as it is a chronic indwelling foley, and pt was admitted with a foley.  Family had no further questions concerning discharge instructions.  Pt was in no acute distress.

## 2014-01-09 NOTE — Plan of Care (Signed)
Problem: Phase I Progression Outcomes Goal: EF % per last Echo/documented,Core Reminder form on chart Outcome: Completed/Met Date Met:  01/09/14 EF 60-65%(01-06-14)

## 2014-01-09 NOTE — Discharge Summary (Signed)
[facility] Summary  Patient name: Yvette [name] Medical record number: [id number] Date of birth: 01/01/1917 Age: 80 y.o. Gender: female Date of Admission: 01/05/2014  Date of Discharge: 01/09/2014  Admitting Physician: Leighton Roachodd D McDiarmid, MD  Primary Care Provider: Maryjean KaStreet, Christopher, MD Consultants: Cardiology  Indication for Hospitalization:  Dyspnea Abdominal Pain  Discharge Diagnoses/Problem List:  Acute resp failure 2/2 acute CHF exacerbation Abdominal pain w/ lactic acidosis CAD Paroxysmal A-flutter CKD 4  Disposition: Home w/ family  Discharge Condition: Stable  Discharge Exam:  General: Awake and alert, not in distress. HEENT: NCAT.  Lungs:Air entry equal B/L, mild bibasilar crackles.  Heart: RRR, S1 S2 normal. Abdomen: soft, nontender to palpation. Non distended. No rebound or guarding.  Extremities: No LE edema Neurologic: No focal deficits.  Skin: warm, dry, intact.    Brief Hospital Course:  Yvette [name] is a 80 y.o. female with past medical history of CKD 3-4, CAD status post MI, dementia, hypertension, history of CVA, paroxysmal Aflutter, sick sinus sydrome, significant for presenting with abdominal pain and acute SOB.   Patient reported to the ED at the morning of admission complaining of severe abdominal pain localized to her right lower quadrant. She is reported associated nausea without vomiting. No fevers chills recent constipation or gross hematochezia or melena. According to the patient's daughter she also has been experiencing increased shortness of breath which was associated with this abdominal pain. Patient was hypoxic en route to the ED. She is placed on BiPAP. ED lab results obtained lactic acidosis 5, mild leukocytosis of 11.1, and imaging results consistent with acute pulmonary edema.  Patient was admitted to step down and diuresis was started with Lasix IV 40 mg twice a day. Cardiology was consulted.  Troponins were cycled and positive 3 peaking at 1.71. A repeat echocardiogram was obtained on 10/27 which showed no significant changes from her prior which was performed on 11/10/13. CTA obtained on 10/26 showed no evidence of PE, and findings consistent with pulmonary edema.  Patient's abdominal pain subsided shortly after admission. Patient continued to be diuresed using IV Lasix. On 10/29 she was noted to have a bump in her creatinine to 2.65 (up from 1.7). Lasix was held. She is encouraged to have good oral intake. She was watched overnight and labs were redrawn the following morning. Creatinine showed slight improvement of 2.61. At this time cardiology and primary care team both agreed patient was stable for discharge.  Patient was instructed to hold her Demadex until Sunday 11/1. She was informed to continue on her normal regimen on a Sunday, Tuesday, Thursday schedule.    Issues for Follow Up:  - Creatinine elevation, acute. Continue to monitor. - Acute CHF; monitor fluids/weight - Demadex 3x/week. Ensure compliance.   Significant Procedures: none  Significant Labs and Imaging:   Recent Labs Lab 01/05/14 0421 01/05/14 0437  WBC 11.1*  --   HGB 11.1* 14.6  HCT 41.0 43.0  PLT 342  --     Recent Labs Lab 01/05/14 0421 01/05/14 0437 01/08/14 0610 01/09/14 0340  NA 141 143 142 147  K 5.6* 5.3 3.9 4.7  CL 106 113* 101 108  CO2 17*  --  21 23  GLUCOSE 162* 163* 132* 84  BUN 39* 43* 68* 79*  CREATININE 1.60* 1.70* 2.65* 2.61*  CALCIUM 9.2  --  8.8 9.0  ALKPHOS 64  --   --   --   AST 32  --   --   --  ALT 18  --   --   --   ALBUMIN 3.2*  --   --   --    CTA 10/26 IMPRESSION:  No pulmonary embolism.  Cardiomegaly. Central vascular congestion. Ground-glass opacities  and interlobular septal thickening, most in keeping with pulmonary  edema.  Prominent mediastinal and right hilar lymph nodes are nonspecific  and may be reactive.  Diffuse osteopenia and multilevel  compression fractures including  mild interval progression at T5. Correlate for point tenderness.  No acute abdominopelvic process identified by CT.  Colonic diverticulosis.      Results/Tests Pending at Time of Discharge: none  Discharge Medications:    Medication List    STOP taking these medications       megestrol 40 MG tablet  Commonly known as:  MEGACE      ASK your doctor about these medications       aspirin EC 81 MG tablet  Take 81 mg by mouth daily.     diclofenac sodium 1 % Gel  Commonly known as:  VOLTAREN  Apply 2 g topically 4 (four) times daily.     hydrALAZINE 50 MG tablet  Commonly known as:  APRESOLINE  Take 50 mg by mouth 4 (four) times daily.     isosorbide mononitrate 30 MG 24 hr tablet  Commonly known as:  IMDUR  Take 15 mg by mouth daily.     Melatonin 5 MG Tabs  Take 10 mg by mouth at bedtime.     multivitamin with minerals Tabs tablet  Take 1 tablet by mouth daily.     nitroGLYCERIN 0.4 MG SL tablet  Commonly known as:  NITROSTAT  Place 1 tablet (0.4 mg total) under the tongue every 5 (five) minutes as needed for chest pain.     pantoprazole 40 MG tablet  Commonly known as:  PROTONIX  Take 40 mg by mouth daily.     pindolol 5 MG tablet  Commonly known as:  VISKEN  Take 2.5-5 mg by mouth See admin instructions. Take 1 tablet in the morning and 0.5 tablet in the evening.     torsemide 20 MG tablet  Commonly known as:  DEMADEX  Take 1 tablet (20 mg total) by mouth daily. Take on Tuesday, Thursday and Saturday at 6pm.     URIBEL 118 MG Caps  Take 1 capsule by mouth 4 (four) times daily.        Discharge Instructions: Please refer to Patient Instructions section of EMR for full details.  Patient was counseled important signs and symptoms that should prompt return to medical care, changes in medications, dietary instructions, activity restrictions, and follow up appointments.   Follow-Up Appointments: Follow-up Information    Follow up with Rollene RotundaJames Hochrein, MD. (Dr Hochrein's office will call with an apppointment)    Specialty:  Cardiology   Contact information:   [address] [place] [id number] [phone]       Follow up with Hazeline JunkerGrunz, Ryan, MD. (@ 9:30 AM)    Specialty:  Family Medicine   Contact information:   [address]  [place] [address] [phone]       Kathee DeltonIan D McKeag, MD 01/09/2014, 12:23 PM PGY-1, [facility]

## 2014-01-12 ENCOUNTER — Ambulatory Visit (INDEPENDENT_AMBULATORY_CARE_PROVIDER_SITE_OTHER): Payer: Medicaid Other | Admitting: Family Medicine

## 2014-01-12 ENCOUNTER — Telehealth: Payer: Self-pay | Admitting: Cardiology

## 2014-01-12 VITALS — BP 130/52 | Temp 97.5°F | Ht 60.0 in | Wt 94.0 lb

## 2014-01-12 DIAGNOSIS — I5032 Chronic diastolic (congestive) heart failure: Secondary | ICD-10-CM

## 2014-01-12 DIAGNOSIS — S31831A Laceration without foreign body of anus, initial encounter: Secondary | ICD-10-CM | POA: Insufficient documentation

## 2014-01-12 NOTE — Assessment & Plan Note (Addendum)
Weight up 3 lbs from discharge weight (92 from 89) after holding diuretic. No symptoms. Told to restart this thrice weekly as before hospitalization.

## 2014-01-12 NOTE — Progress Notes (Signed)
Patient ID: Yvette [name], female   DOB: 12/30/1916, 80 y.o.   MRN: [id number]   Subjective:  Yvette [name] is a 80 y.o. female with a history of STEMI, sick sinus syndrome, paroxysmal a flutter, and chronic diastolic CHF as well as CKD stage IV, recent hospitalization for acute CHF exacerbation here for hospital follow up.  Entirety of interview facilitation by translator Lanae BoastMai Fell.   She was admitted for dyspnea, had positive troponins and stable echo, and no PE. She has taken demadex as instructed starting yesterday and will continue three times weekly. She and her grandson report good control of symptoms since discharge. No chest pain, abdominal pain, dyspnea. Urine output has been consistent. Will follow up with Dr. Antoine PocheHochrein 11/11.   Admission weight: 96 lbs Discharge weight: 89 lbs  All other pertinent systems reviewed and are negative. Objective:  BP 130/52 mmHg  Temp(Src) 97.5 F (36.4 C) (Oral)  Ht 5' (1.524 m)  Wt 94 lb (42.638 kg)  BMI 18.36 kg/m2  Gen:  80 y.o. female with a foley sitting in wheelchair in NAD HEENT: MMM CV: regular rate, no murmur or JVD. No LE edema.  Resp: Non-labored, bibasilar crackles Skin: Torn thin skin ~2cm x 2cm near anus with clear base without bleeding, purulence, inflammation or surrounding erythema. No other areas of skin tears.  Assessment & Plan:  Yvette [name] is a 80 y.o. female with:  Problem List Items Addressed This Visit    None

## 2014-01-12 NOTE — Progress Notes (Signed)
Interpreter from [facility] Debarr assisted pt because of language barrier.Glennie HawkSimpson, Tequlia Gonsalves R

## 2014-01-12 NOTE — Patient Instructions (Signed)
Yvette [name]'s weight is up a little from discharge so I'd like her to start back taking the torsemide 3 times per week. Otherwise she sounded good on exam. Make sure you follow up with the cardiologist next week.

## 2014-01-12 NOTE — Telephone Encounter (Signed)
TCM phone call. I spoke with family member. They say she is doing well. She saw her PCP today and has an appointment here in 9 days.  Yvette ShelterLUKE Yvette Rempel PA-C 01/12/2014 1:34 PM

## 2014-01-12 NOTE — Assessment & Plan Note (Signed)
Superficial tear around anus without infection. Recommended vaseline occlusion and gentle wiping by her caretaker.

## 2014-01-13 ENCOUNTER — Encounter (HOSPITAL_COMMUNITY): Payer: Self-pay | Admitting: *Deleted

## 2014-01-13 ENCOUNTER — Emergency Department (HOSPITAL_COMMUNITY)
Admission: EM | Admit: 2014-01-13 | Discharge: 2014-01-13 | Disposition: A | Discharge status: Home / Self Care | Payer: Medicaid Other | Attending: Emergency Medicine | Admitting: Emergency Medicine

## 2014-01-13 DIAGNOSIS — T83038A Leakage of other indwelling urethral catheter, initial encounter: Secondary | ICD-10-CM | POA: Diagnosis present

## 2014-01-13 DIAGNOSIS — Z8673 Personal history of transient ischemic attack (TIA), and cerebral infarction without residual deficits: Secondary | ICD-10-CM | POA: Insufficient documentation

## 2014-01-13 DIAGNOSIS — Z87440 Personal history of urinary (tract) infections: Secondary | ICD-10-CM | POA: Diagnosis not present

## 2014-01-13 DIAGNOSIS — I252 Old myocardial infarction: Secondary | ICD-10-CM | POA: Diagnosis not present

## 2014-01-13 DIAGNOSIS — I10 Essential (primary) hypertension: Secondary | ICD-10-CM | POA: Insufficient documentation

## 2014-01-13 DIAGNOSIS — I2510 Atherosclerotic heart disease of native coronary artery without angina pectoris: Secondary | ICD-10-CM | POA: Diagnosis not present

## 2014-01-13 DIAGNOSIS — Z791 Long term (current) use of non-steroidal anti-inflammatories (NSAID): Secondary | ICD-10-CM | POA: Insufficient documentation

## 2014-01-13 DIAGNOSIS — M1990 Unspecified osteoarthritis, unspecified site: Secondary | ICD-10-CM | POA: Diagnosis not present

## 2014-01-13 DIAGNOSIS — Z8742 Personal history of other diseases of the female genital tract: Secondary | ICD-10-CM | POA: Insufficient documentation

## 2014-01-13 DIAGNOSIS — Z7982 Long term (current) use of aspirin: Secondary | ICD-10-CM | POA: Diagnosis not present

## 2014-01-13 DIAGNOSIS — Z8639 Personal history of other endocrine, nutritional and metabolic disease: Secondary | ICD-10-CM | POA: Diagnosis not present

## 2014-01-13 DIAGNOSIS — H269 Unspecified cataract: Secondary | ICD-10-CM | POA: Diagnosis not present

## 2014-01-13 DIAGNOSIS — G8929 Other chronic pain: Secondary | ICD-10-CM | POA: Diagnosis not present

## 2014-01-13 DIAGNOSIS — Y846 Urinary catheterization as the cause of abnormal reaction of the patient, or of later complication, without mention of misadventure at the time of the procedure: Secondary | ICD-10-CM | POA: Insufficient documentation

## 2014-01-13 DIAGNOSIS — T839XXA Unspecified complication of genitourinary prosthetic device, implant and graft, initial encounter: Secondary | ICD-10-CM

## 2014-01-13 DIAGNOSIS — Z8659 Personal history of other mental and behavioral disorders: Secondary | ICD-10-CM | POA: Insufficient documentation

## 2014-01-13 DIAGNOSIS — Z8619 Personal history of other infectious and parasitic diseases: Secondary | ICD-10-CM | POA: Insufficient documentation

## 2014-01-13 DIAGNOSIS — Z79899 Other long term (current) drug therapy: Secondary | ICD-10-CM | POA: Diagnosis not present

## 2014-01-13 DIAGNOSIS — I5033 Acute on chronic diastolic (congestive) heart failure: Secondary | ICD-10-CM | POA: Insufficient documentation

## 2014-01-13 NOTE — ED Provider Notes (Signed)
CSN: [id number]     Arrival date & time 01/13/14  2006 History   First MD Initiated Contact with Patient 01/13/14 2145     Chief Complaint  Patient presents with  . Follow-up     (Consider location/radiation/quality/duration/timing/severity/associated sxs/prior Treatment) HPI  The patient's daughter reports that her catheter has been leaking today. She reports that she had a Foley catheter placed 2 days ago and today all of the urine was leaking around it and she was not filling the bag. Her daughter reports she had no pain complaint. She has had no fevers no nausea no vomiting. She reports all of her other medical problems are baseline.  Past Medical History  Diagnosis Date  . [facility]    High fall risk-not a Coumadin candidate  . Hypertension   . Chronic diastolic heart failure     a. Echo February 2012: EF 55%; moderate LVH; mild AI; mild MR; PASP 39  . Sick sinus syndrome     a. Limits use of beta blocker-pindolol tolerated  . CAD (coronary artery disease) PRESUMED    a. 04/2010 NSTEMI->med rx;  b. 09/2012 inf stemi->med rx.  . Arthritis   . Anxiety   . Hydronephrosis     Chronic  . Chronic back pain   . Stroke   . Herpes zoster 03/01/2012  . Myocardial infarction   . CHF (congestive heart failure)   . Chronic indwelling Foley catheter 11/10/2013  . Cataracts, bilateral, very poor vision 12/25/2013  . UTI (urinary tract infection) 10/04/2012  . Unstable angina 10/04/2012  . SOB (shortness of breath) 10/05/2011  . Respiratory distress 11/10/2013  . Protein-calorie malnutrition, severe 11/11/2013  . Physical deconditioning 04/26/2011  . Loss of weight 08/29/2012  . Fatigue 10/04/2011  . Encounter for palliative care 07/24/2012  . Acute on chronic diastolic CHF (congestive heart failure), NYHA class 1 10/05/2011  . History of ST elevation myocardial infarction (STEMI) 09/24/2012   Past Surgical History  Procedure Laterality Date  .  Esophagogastroduodenoscopy  04/19/2011    Procedure: ESOPHAGOGASTRODUODENOSCOPY (EGD);  Surgeon: Erick BlinksJay Pyrtle, MD;  [facility];  Service: Gastroenterology;  Laterality: N/A;   Family History  Problem Relation Age of Onset  . CAD Neg Hx    History  Substance Use Topics  . Smoking status: Never Smoker   . Smokeless tobacco: Never Used  . Alcohol Use: No   OB History    No data available     Review of Systems 10 Systems reviewed and are negative for acute change except as noted in the HPI. There is a language barrier and the daughter gives the review of systems.   Allergies  Review of patient's allergies indicates no known allergies.  Home Medications   Prior to Admission medications   Medication Sig Start Date End Date Taking? Authorizing Provider  aspirin EC 81 MG tablet Take 81 mg by mouth daily.   Yes Historical Provider, MD  diclofenac sodium (VOLTAREN) 1 % GEL Apply 2 g topically 4 (four) times daily. 10/03/13  Yes Stephanie Couphristopher M Street, MD  hydrALAZINE (APRESOLINE) 50 MG tablet Take 50 mg by mouth 4 (four) times daily.   Yes Historical Provider, MD  isosorbide mononitrate (IMDUR) 30 MG 24 hr tablet Take 15 mg by mouth daily.   Yes Historical Provider, MD  levofloxacin (LEVAQUIN) 500 MG tablet Take 1 tablet (500 mg total) by mouth every other day. Patient taking differently: Take 500 mg by mouth every other day.  Have two more pills to take per child 01/09/14  Yes Kathee DeltonIan D McKeag, MD  megestrol (MEGACE) 40 MG tablet Take 40 mg by mouth 4 (four) times daily.   Yes Historical Provider, MD  Melatonin 5 MG TABS Take 10 mg by mouth at bedtime.    Yes Historical Provider, MD  Meth-Hyo-M Bl-Na Phos-Ph Sal (URIBEL) 118 MG CAPS Take 1 capsule by mouth 4 (four) times daily.   Yes Historical Provider, MD  Multiple Vitamin (MULTIVITAMIN WITH MINERALS) TABS tablet Take 1 tablet by mouth daily.   Yes Historical Provider, MD  nitroGLYCERIN (NITROSTAT) 0.4 MG SL tablet Place 1 tablet (0.4  mg total) under the tongue every 5 (five) minutes as needed for chest pain. 10/04/12  Yes Ok Anishristopher R Berge, NP  pantoprazole (PROTONIX) 40 MG tablet Take 40 mg by mouth daily.   Yes Historical Provider, MD  pindolol (VISKEN) 5 MG tablet Take 2.5-5 mg by mouth See admin instructions. Take 1 tablet in the morning and 0.5 tablet in the evening.   Yes Historical Provider, MD  torsemide (DEMADEX) 20 MG tablet Take 1 tablet (20 mg total) by mouth daily. Take on Tuesday, Thursday and Sunday at 6pm. 01/09/14  Yes Leona SingletonMaria T Thekkekandam, MD   BP 137/96 mmHg  Pulse 86  Temp(Src) 98.4 F (36.9 C) (Oral)  Resp 24  SpO2 98% Physical Exam  Constitutional: She appears well-developed.  This is a slender but well developed elderly female. She is nontoxic in appearance and has no respiratory distress.  HENT:  Head: Normocephalic and atraumatic.  Eyes: EOM are normal.  Cardiovascular: Normal rate, regular rhythm, normal heart sounds and intact distal pulses.   Pulmonary/Chest: Effort normal and breath sounds normal. No respiratory distress. She has no wheezes.  Abdominal: Soft. Bowel sounds are normal. She exhibits no distension and no mass. There is no tenderness.  Musculoskeletal: She exhibits no edema or tenderness.  Neurological: She is alert. No cranial nerve deficit. Coordination normal.  Skin: Skin is warm and dry.  Psychiatric: She has a normal mood and affect.    ED Course  Procedures (including critical care time) Labs Review Labs Reviewed - No data to display  Imaging Review No results found.   EKG Interpretation None      MDM   Final diagnoses:  Foley catheter problem, initial encounter   Nursing staff has replaced Foley catheter. The catheter is draining appropriately and there has been no leakage around it. At this point there do not appear to be other complications.    Arby BarretteMarcy Aydian Dimmick, MD 01/13/14 2231

## 2014-01-13 NOTE — ED Notes (Signed)
Pt in stating she had a foley catheter placed when she was in the hospital, today, the catheter started leaking, pt has urine leaking around the catheter and the foley bag is leaking, pt does not feel like her bladder is full, abdomen not distended, no distress noted

## 2014-01-13 NOTE — Discharge Instructions (Signed)
Foley Catheter Care °A Foley catheter is a soft, flexible tube that is placed into the bladder to drain urine. A Foley catheter may be inserted if: °· You leak urine or are not able to control when you urinate (urinary incontinence). °· You are not able to urinate when you need to (urinary retention). °· You had prostate surgery or surgery on the genitals. °· You have certain medical conditions, such as multiple sclerosis, dementia, or a spinal cord injury. °If you are going home with a Foley catheter in place, follow the instructions below. °TAKING CARE OF THE CATHETER °1. Wash your hands with soap and water. °2. Using mild soap and warm water on a clean washcloth: °· Clean the area on your body closest to the catheter insertion site using a circular motion, moving away from the catheter. Never wipe toward the catheter because this could sweep bacteria up into the urethra and cause infection. °· Remove all traces of soap. Pat the area dry with a clean towel. For males, reposition the foreskin. °3. Attach the catheter to your leg so there is no tension on the catheter. Use adhesive tape or a leg strap. If you are using adhesive tape, remove any sticky residue left behind by the previous tape you used. °4. Keep the drainage bag below the level of the bladder, but keep it off the floor. °5. Check throughout the day to be sure the catheter is working and urine is draining freely. Make sure the tubing does not become kinked. °6. Do not pull on the catheter or try to remove it. Pulling could damage internal tissues. °TAKING CARE OF THE DRAINAGE BAGS °You will be given two drainage bags to take home. One is a large overnight drainage bag, and the other is a smaller leg bag that fits underneath clothing. You may wear the overnight bag at any time, but you should never wear the smaller leg bag at night. Follow the instructions below for how to empty, change, and clean your drainage bags. °Emptying the Drainage Bag °You must  empty your drainage bag when it is  -½ full or at least 2-3 times a day. °1. Wash your hands with soap and water. °2. Keep the drainage bag below your hips, below the level of your bladder. This stops urine from going back into the tubing and into your bladder. °3. Hold the dirty bag over the toilet or a clean container. °4. Open the pour spout at the bottom of the bag and empty the urine into the toilet or container. Do not let the pour spout touch the toilet, container, or any other surface. Doing so can place bacteria on the bag, which can cause an infection. °5. Clean the pour spout with a gauze pad or cotton ball that has rubbing alcohol on it. °6. Close the pour spout. °7. Attach the bag to your leg with adhesive tape or a leg strap. °8. Wash your hands well. °Changing the Drainage Bag °Change your drainage bag once a month or sooner if it starts to smell bad or look dirty. Below are steps to follow when changing the drainage bag. °1. Wash your hands with soap and water. °2. Pinch off the rubber catheter so that urine does not spill out. °3. Disconnect the catheter tube from the drainage tube at the connection valve. Do not let the tubes touch any surface. °4. Clean the end of the catheter tube with an alcohol wipe. Use a different alcohol wipe to clean the   end of the drainage tube. °5. Connect the catheter tube to the drainage tube of the clean drainage bag. °6. Attach the new bag to the leg with adhesive tape or a leg strap. Avoid attaching the new bag too tightly. °7. Wash your hands well. °Cleaning the Drainage Bag °1. Wash your hands with soap and water. °2. Wash the bag in warm, soapy water. °3. Rinse the bag thoroughly with warm water. °4. Fill the bag with a solution of white vinegar and water (1 cup vinegar to 1 qt warm water [.2 L vinegar to 1 L warm water]). Close the bag and soak it for 30 minutes in the solution. °5. Rinse the bag with warm water. °6. Hang the bag to dry with the pour spout open  and hanging downward. °7. Store the clean bag (once it is dry) in a clean plastic bag. °8. Wash your hands well. °PREVENTING INFECTION °· Wash your hands before and after handling your catheter. °· Take showers daily and wash the area where the catheter enters your body. Do not take baths. Replace wet leg straps with dry ones, if this applies. °· Do not use powders, sprays, or lotions on the genital area. Only use creams, lotions, or ointments as directed by your caregiver. °· For females, wipe from front to back after each bowel movement. °· Drink enough fluids to keep your urine clear or pale yellow unless you have a fluid restriction. °· Do not let the drainage bag or tubing touch or lie on the floor. °· Wear cotton underwear to absorb moisture and to keep your skin drier. °SEEK MEDICAL CARE IF:  °· Your urine is cloudy or smells unusually bad. °· Your catheter becomes clogged. °· You are not draining urine into the bag or your bladder feels full. °· Your catheter starts to leak. °SEEK IMMEDIATE MEDICAL CARE IF:  °· You have pain, swelling, redness, or pus where the catheter enters the body. °· You have pain in the abdomen, legs, lower back, or bladder. °· You have a fever. °· You see blood fill the catheter, or your urine is pink or red. °· You have nausea, vomiting, or chills. °· Your catheter gets pulled out. °MAKE SURE YOU:  °· Understand these instructions. °· Will watch your condition. °· Will get help right away if you are not doing well or get worse. °Document Released: 02/27/2005 Document Revised: 07/14/2013 Document Reviewed: 02/19/2012 °Exit[employer]® Patient Information ©2015 [employer]. This information is not intended to replace advice given to you by your health care provider. Make sure you discuss any questions you have with your health care provider. ° °

## 2014-01-14 ENCOUNTER — Telehealth: Payer: Self-pay | Admitting: Family Medicine

## 2014-01-14 MED ORDER — SILVER SULFADIAZINE 1 % EX CREA: 1.0000 "application " | TOPICAL_CREAM | Freq: Every day | CUTANEOUS | Status: DC

## 2014-01-14 NOTE — Telephone Encounter (Signed)
Called pt's daughter to discuss. Pt has some irritation on her bottom now worse some on her back as well as her neck. Vaseline has not helped. Daughter reports no areas of frank breakdown, bleeding, or draining, and pt is otherwise in her normal state of health. Rx called into pt's pharmacy for Silvadene cream daily to affected areas. Daughter instructed to try the cream for a few days and to bring her mother back in to be re-evaluated late this week or early next week if her problems worsen. Advised daughter to take pt to the ED if she starts having any frank ulceration, bleeding, or draining, or if she has systemic signs / symptoms of illness. Daughter voiced understanding. --CMS

## 2014-01-14 NOTE — Telephone Encounter (Signed)
Daughter calls, patient was seen on 11/2 by Dr. Jarvis NewcomerGrunz. Daughter states that patient irritation on her bottom area that has now spread all over her body. Daughter needs advice on what she needs to do. Please advise.

## 2014-01-21 ENCOUNTER — Encounter: Payer: Self-pay | Admitting: Physician Assistant

## 2014-01-21 ENCOUNTER — Ambulatory Visit (INDEPENDENT_AMBULATORY_CARE_PROVIDER_SITE_OTHER): Payer: Medicaid Other | Admitting: Physician Assistant

## 2014-01-21 ENCOUNTER — Other Ambulatory Visit: Payer: Self-pay | Admitting: Physician Assistant

## 2014-01-21 ENCOUNTER — Ambulatory Visit
Admission: RE | Admit: 2014-01-21 | Discharge: 2014-01-21 | Disposition: A | Discharge status: Home / Self Care | Payer: Medicaid Other | Source: Ambulatory Visit | Attending: Physician Assistant | Admitting: Physician Assistant

## 2014-01-21 VITALS — BP 122/62 | HR 94 | Ht <= 58 in | Wt 94.6 lb

## 2014-01-21 DIAGNOSIS — I5041 Acute combined systolic (congestive) and diastolic (congestive) heart failure: Secondary | ICD-10-CM

## 2014-01-21 DIAGNOSIS — I4892 Unspecified atrial flutter: Secondary | ICD-10-CM

## 2014-01-21 DIAGNOSIS — Z79899 Other long term (current) drug therapy: Secondary | ICD-10-CM

## 2014-01-21 DIAGNOSIS — R0602 Shortness of breath: Secondary | ICD-10-CM

## 2014-01-21 DIAGNOSIS — I5033 Acute on chronic diastolic (congestive) heart failure: Secondary | ICD-10-CM

## 2014-01-21 DIAGNOSIS — N184 Chronic kidney disease, stage 4 (severe): Secondary | ICD-10-CM

## 2014-01-21 DIAGNOSIS — I10 Essential (primary) hypertension: Secondary | ICD-10-CM

## 2014-01-21 LAB — BASIC METABOLIC PANEL
BUN: 50 mg/dL — AB (ref 6–23)
CHLORIDE: 109 meq/L (ref 96–112)
CO2: 20 mEq/L (ref 19–32)
Calcium: 8.4 mg/dL (ref 8.4–10.5)
Creat: 1.62 mg/dL — ABNORMAL HIGH (ref 0.50–1.10)
GLUCOSE: 130 mg/dL — AB (ref 70–99)
POTASSIUM: 4.6 meq/L (ref 3.5–5.3)
Sodium: 139 mEq/L (ref 135–145)

## 2014-01-21 NOTE — Progress Notes (Signed)
Date:  01/21/2014   ID:  Yvette [name], DOB 11/06/1916, MRN [id number]  PCP:  Maryjean KaStreet, Christopher, MD  Primary Cardiologist:  Hochrein     History of Present Illness: Yvette [name] is a 80 y.o. female with a Hx of chronic diastolic CHF, CAD (STEMI 2012, Rx medically), CRI, PAF (not coumadin candidate due to falls), SSS and HTN. [facility] with acute on chronic diastolic CHF in August. Palliative care declined by family and the patient is a FULL CODE.   When she was in clinic last her volume status appeared to be OK. [facility] October [facility] - 30th for acute respiratory failure secondary to CHF exacerbation.   BNP 9191. CXR with pulm edema. CTA neg for PE.  Lactic acid was 5. Trop elevated at 1.27 IVlasix started and change to by mouth torsemide 3 days a week at discharge. CT of abdomen was also negative.   Presents for evaluation today. Her daughter feels that she is more short of breath. She has gained 5 pounds since her discharge.  The daughter helped transiently as well as a Nurse, learning disabilitytranslator.  Patient sleeps on 3 pillows daughter says that this helps her breathe better. She's had no problems with lower extremity edema.  There is no report of nausea, vomiting, fever, chest pain,  Dizziness(I don't think she ambulates much), PND, cough, congestion, abdominal pain, hematochezia, melena.  Wt Readings from Last 3 Encounters:  01/21/14 94 lb 9.6 oz (42.91 kg)  01/12/14 94 lb (42.638 kg)  01/09/14 89 lb 4.6 oz (40.5 kg)     Past Medical History  Diagnosis Date  . [facility]ox[facility]    High fall risk-not a Coumadin candidate  . Hypertension   . Chronic diastolic heart failure     a. Echo February 2012: EF 55%; moderate LVH; mild AI; mild MR; PASP 39  . Sick sinus syndrome     a. Limits use of beta blocker-pindolol tolerated  . CAD (coronary artery disease) PRESUMED    a. 04/2010 NSTEMI->med rx;  b. 09/2012 inf  stemi->med rx.  . Arthritis   . Anxiety   . Hydronephrosis     Chronic  . Chronic back pain   . Stroke   . Herpes zoster 03/01/2012  . Myocardial infarction   . CHF (congestive heart failure)   . Chronic indwelling Foley catheter 11/10/2013  . Cataracts, bilateral, very poor vision 12/25/2013  . UTI (urinary tract infection) 10/04/2012  . Unstable angina 10/04/2012  . SOB (shortness of breath) 10/05/2011  . Respiratory distress 11/10/2013  . Protein-calorie malnutrition, severe 11/11/2013  . Physical deconditioning 04/26/2011  . Loss of weight 08/29/2012  . Fatigue 10/04/2011  . Encounter for palliative care 07/24/2012  . Acute on chronic diastolic CHF (congestive heart failure), NYHA class 1 10/05/2011  . History of ST elevation myocardial infarction (STEMI) 09/24/2012    Current Outpatient Prescriptions  Medication Sig Dispense Refill  . aspirin EC 81 MG tablet Take 81 mg by mouth daily.    . diclofenac sodium (VOLTAREN) 1 % GEL Apply 2 g topically 4 (four) times daily. 1 Tube 5  . hydrALAZINE (APRESOLINE) 50 MG tablet Take 50 mg by mouth 4 (four) times daily.    . isosorbide mononitrate (IMDUR) 30 MG 24 hr tablet Take 15 mg by mouth daily.    Marland Kitchen. levofloxacin (LEVAQUIN) 500 MG tablet Take 1 tablet (500 mg total) by mouth every other  day. (Patient taking differently: Take 500 mg by mouth every other day. Have two more pills to take per child) 4 tablet 0  . megestrol (MEGACE) 40 MG tablet Take 40 mg by mouth 4 (four) times daily.    . Melatonin 5 MG TABS Take 10 mg by mouth at bedtime.     . Meth-Hyo-M Bl-Na Phos-Ph Sal (URIBEL) 118 MG CAPS Take 1 capsule by mouth 4 (four) times daily.    . Multiple Vitamin (MULTIVITAMIN WITH MINERALS) TABS tablet Take 1 tablet by mouth daily.    . nitroGLYCERIN (NITROSTAT) 0.4 MG SL tablet Place 1 tablet (0.4 mg total) under the tongue every 5 (five) minutes as needed for chest pain. 25 tablet 3  . pantoprazole (PROTONIX) 40 MG tablet Take 40 mg by mouth  daily.    . pindolol (VISKEN) 5 MG tablet Take 2.5-5 mg by mouth See admin instructions. Take 1 tablet in the morning and 0.5 tablet in the evening.    . silver sulfADIAZINE (SILVADENE) 1 % cream Apply 1 application topically daily. To affected skin areas. 50 g 1  . torsemide (DEMADEX) 20 MG tablet Take 1 tablet (20 mg total) by mouth daily. Take on Tuesday, Thursday and Sunday at 6pm. 30 tablet 0   No current facility-administered medications for this visit.    Allergies:   No Known Allergies  Social History:  The patient  reports that she has never smoked. She has never used smokeless tobacco. She reports that she does not drink alcohol or use illicit drugs.   Family history:   Family History  Problem Relation Age of Onset  . CAD Neg Hx     ROS:  Please see the history of present illness.  All other systems reviewed and negative.   PHYSICAL EXAM: VS:  BP 122/62 mmHg  Pulse 94  Ht 4\' 5"  (1.346 m)  Wt 94 lb 9.6 oz (42.91 kg)  BMI 23.68 kg/m2 Well nourished, well developed, in no acute distress HEENT: Pupils are equal round react to light accommodation extraocular movements are intact.  Neck: no apparent JVD Cardiac: Regular rate and rhythm without murmurs rubs or gallops. Lungs:  Mild basilar crackles, positive rhonchi  Abd: soft, nontender, positive bowel sounds all quadrants.  Appears little distended Ext: no lower extremity edema.  2+ radial and dorsalis pedis pulses. Skin: warm and dry Neuro:  Grossly normal  EKG:  Sinus rhythm rate 94 bpm with lateral T-wave inversion which are unchanged from prior septal Q waves.  ASSESSMENT AND PLAN:  Problem List Items Addressed This Visit    Acute on chronic diastolic CHF (congestive heart failure)    Patient may be mildly volume overloaded. She does not have any lower extremity edema does have some mild crackles on exam. She does have a lot of pharyngeal noises while she's breathing and does appear to be tachypneic. Does not say  that she is short of breath when asked with the translator. Her daughter does report she sleeps on about 3 pillows at home which helps her breathe better. I'm going to check a chest x-ray and a BNP.  She currently takes torsemide Tuesday Thursday Saturday. she will take a dose today then I will review her test results.    Chronic kidney disease (CKD), stage IV (severe) (Chronic)    Checking basic metabolic panel    HTN (hypertension) (Chronic)    Pressure controlled    Paroxysmal atrial flutter (Chronic)    Maintaining sinus rhythm 94 bpm.  She is a high fall risk and Coumadin     Other Visit Diagnoses    Acute combined systolic and diastolic congestive heart failure    -  Primary    Relevant Orders       EKG 12-Lead       B Nat Peptide    Shortness of breath        Relevant Orders       EKG 12-Lead       B Nat Peptide       DG Chest 2 View    Encounter for long-term (current) use of medications        Relevant Orders       Basic Metabolic Panel (BMET)

## 2014-01-21 NOTE — Assessment & Plan Note (Signed)
Pressure controlled 

## 2014-01-21 NOTE — Assessment & Plan Note (Signed)
Patient may be mildly volume overloaded. She does not have any lower extremity edema does have some mild crackles on exam. She does have a lot of pharyngeal noises while she's breathing and does appear to be tachypneic. Does not say that she is short of breath when asked with the translator. Her daughter does report she sleeps on about 3 pillows at home which helps her breathe better. I'm going to check a chest x-ray and a BNP.  She currently takes torsemide Tuesday Thursday Saturday. she will take a dose today then I will review her test results.

## 2014-01-21 NOTE — Assessment & Plan Note (Signed)
Maintaining sinus rhythm 94 bpm. She is a high fall risk and Coumadin

## 2014-01-21 NOTE — Assessment & Plan Note (Signed)
Checking basic metabolic panel

## 2014-01-21 NOTE — Patient Instructions (Addendum)
Your physician recommends that you schedule a follow-up appointment in: 1 Month with Dr Antoine PocheHochrein or PA  Your physician recommends that you return for lab work BNP and BMP  Your physician has recommended you make the following change in your medication: Take an Extra dose to torsemide today then back to regular dose  A chest x-ray takes a picture of the organs and structures inside the chest, including the heart, lungs, and blood vessels. This test can show several things, including, whether the heart is enlarges; whether fluid is building up in the lungs; and whether pacemaker / defibrillator leads are still in place.

## 2014-01-22 LAB — BRAIN NATRIURETIC PEPTIDE: Brain Natriuretic Peptide: 461.3 pg/mL — ABNORMAL HIGH (ref 0.0–100.0)

## 2014-01-26 ENCOUNTER — Telehealth: Payer: Self-pay | Admitting: Physician Assistant

## 2014-01-26 NOTE — Telephone Encounter (Signed)
Lab and CXR results phoned in to the patient's daughter.  Wilburt FinlayHAGER, Lesbia Ottaway PAC

## 2014-02-02 ENCOUNTER — Other Ambulatory Visit: Payer: Self-pay | Admitting: Family Medicine

## 2014-02-23 ENCOUNTER — Telehealth: Payer: Self-pay | Admitting: Cardiology

## 2014-02-23 NOTE — Telephone Encounter (Signed)
Pt. Called ,no answer LMTCB 

## 2014-02-23 NOTE — Telephone Encounter (Signed)
Pt.s daughter is concerned that her  Mom's wt. Is climbing. And is up to 98 lbs. No complaint of sob

## 2014-02-23 NOTE — Telephone Encounter (Signed)
Pt. Called no answer LMTCB 

## 2014-02-23 NOTE — Telephone Encounter (Signed)
New Prob    States pts weights has been increases every 3 days. Daughter has some questions regarding adjustments or alternative for fluid pill. Please call.

## 2014-02-23 NOTE — Telephone Encounter (Signed)
Returning your call. °

## 2014-02-25 ENCOUNTER — Other Ambulatory Visit: Payer: Self-pay | Admitting: Cardiology

## 2014-02-25 ENCOUNTER — Other Ambulatory Visit: Payer: Self-pay | Admitting: *Deleted

## 2014-02-25 DIAGNOSIS — E878 Other disorders of electrolyte and fluid balance, not elsewhere classified: Secondary | ICD-10-CM

## 2014-02-25 MED ORDER — TORSEMIDE 20 MG PO TABS: ORAL_TABLET | ORAL | Status: DC

## 2014-03-03 ENCOUNTER — Inpatient Hospital Stay (HOSPITAL_COMMUNITY)
Admission: EM | Admit: 2014-03-03 | Discharge: 2014-03-06 | DRG: 291 | Disposition: A | Discharge status: Home / Self Care | Payer: Medicaid Other | Attending: Family Medicine | Admitting: Family Medicine

## 2014-03-03 ENCOUNTER — Encounter (HOSPITAL_COMMUNITY): Payer: Self-pay | Admitting: *Deleted

## 2014-03-03 ENCOUNTER — Emergency Department (HOSPITAL_COMMUNITY): Payer: Medicaid Other

## 2014-03-03 DIAGNOSIS — E872 Acidosis: Secondary | ICD-10-CM | POA: Diagnosis present

## 2014-03-03 DIAGNOSIS — Z7982 Long term (current) use of aspirin: Secondary | ICD-10-CM

## 2014-03-03 DIAGNOSIS — N390 Urinary tract infection, site not specified: Secondary | ICD-10-CM | POA: Diagnosis present

## 2014-03-03 DIAGNOSIS — F0390 Unspecified dementia without behavioral disturbance: Secondary | ICD-10-CM | POA: Diagnosis present

## 2014-03-03 DIAGNOSIS — F419 Anxiety disorder, unspecified: Secondary | ICD-10-CM | POA: Diagnosis present

## 2014-03-03 DIAGNOSIS — I5033 Acute on chronic diastolic (congestive) heart failure: Secondary | ICD-10-CM | POA: Diagnosis present

## 2014-03-03 DIAGNOSIS — I10 Essential (primary) hypertension: Secondary | ICD-10-CM | POA: Diagnosis present

## 2014-03-03 DIAGNOSIS — J9601 Acute respiratory failure with hypoxia: Secondary | ICD-10-CM | POA: Diagnosis present

## 2014-03-03 DIAGNOSIS — J9600 Acute respiratory failure, unspecified whether with hypoxia or hypercapnia: Secondary | ICD-10-CM | POA: Insufficient documentation

## 2014-03-03 DIAGNOSIS — J9620 Acute and chronic respiratory failure, unspecified whether with hypoxia or hypercapnia: Secondary | ICD-10-CM

## 2014-03-03 DIAGNOSIS — I252 Old myocardial infarction: Secondary | ICD-10-CM | POA: Diagnosis not present

## 2014-03-03 DIAGNOSIS — B9620 Unspecified Escherichia coli [E. coli] as the cause of diseases classified elsewhere: Secondary | ICD-10-CM | POA: Diagnosis present

## 2014-03-03 DIAGNOSIS — E875 Hyperkalemia: Secondary | ICD-10-CM | POA: Diagnosis present

## 2014-03-03 DIAGNOSIS — N179 Acute kidney failure, unspecified: Secondary | ICD-10-CM | POA: Diagnosis present

## 2014-03-03 DIAGNOSIS — M79605 Pain in left leg: Secondary | ICD-10-CM | POA: Diagnosis not present

## 2014-03-03 DIAGNOSIS — I2510 Atherosclerotic heart disease of native coronary artery without angina pectoris: Secondary | ICD-10-CM | POA: Diagnosis present

## 2014-03-03 DIAGNOSIS — D72829 Elevated white blood cell count, unspecified: Secondary | ICD-10-CM | POA: Diagnosis present

## 2014-03-03 DIAGNOSIS — I480 Paroxysmal atrial fibrillation: Secondary | ICD-10-CM | POA: Diagnosis present

## 2014-03-03 DIAGNOSIS — M1990 Unspecified osteoarthritis, unspecified site: Secondary | ICD-10-CM | POA: Diagnosis present

## 2014-03-03 DIAGNOSIS — I4891 Unspecified atrial fibrillation: Secondary | ICD-10-CM | POA: Insufficient documentation

## 2014-03-03 DIAGNOSIS — I495 Sick sinus syndrome: Secondary | ICD-10-CM | POA: Diagnosis present

## 2014-03-03 DIAGNOSIS — N184 Chronic kidney disease, stage 4 (severe): Secondary | ICD-10-CM | POA: Diagnosis present

## 2014-03-03 DIAGNOSIS — N189 Chronic kidney disease, unspecified: Secondary | ICD-10-CM | POA: Insufficient documentation

## 2014-03-03 DIAGNOSIS — G8929 Other chronic pain: Secondary | ICD-10-CM | POA: Diagnosis present

## 2014-03-03 DIAGNOSIS — N289 Disorder of kidney and ureter, unspecified: Secondary | ICD-10-CM

## 2014-03-03 DIAGNOSIS — Z79899 Other long term (current) drug therapy: Secondary | ICD-10-CM | POA: Diagnosis not present

## 2014-03-03 DIAGNOSIS — I129 Hypertensive chronic kidney disease with stage 1 through stage 4 chronic kidney disease, or unspecified chronic kidney disease: Secondary | ICD-10-CM | POA: Diagnosis present

## 2014-03-03 DIAGNOSIS — I4892 Unspecified atrial flutter: Secondary | ICD-10-CM | POA: Diagnosis present

## 2014-03-03 DIAGNOSIS — M549 Dorsalgia, unspecified: Secondary | ICD-10-CM | POA: Diagnosis present

## 2014-03-03 DIAGNOSIS — N1330 Unspecified hydronephrosis: Secondary | ICD-10-CM | POA: Diagnosis present

## 2014-03-03 DIAGNOSIS — R0602 Shortness of breath: Secondary | ICD-10-CM

## 2014-03-03 DIAGNOSIS — R6511 Systemic inflammatory response syndrome (SIRS) of non-infectious origin with acute organ dysfunction: Secondary | ICD-10-CM | POA: Diagnosis present

## 2014-03-03 DIAGNOSIS — J9690 Respiratory failure, unspecified, unspecified whether with hypoxia or hypercapnia: Secondary | ICD-10-CM | POA: Diagnosis present

## 2014-03-03 DIAGNOSIS — E860 Dehydration: Secondary | ICD-10-CM | POA: Diagnosis present

## 2014-03-03 LAB — COMPREHENSIVE METABOLIC PANEL
ALT: 11 U/L (ref 0–35)
AST: 45 U/L — ABNORMAL HIGH (ref 0–37)
Albumin: 3.5 g/dL (ref 3.5–5.2)
Alkaline Phosphatase: 56 U/L (ref 39–117)
Anion gap: 9 (ref 5–15)
BUN: 43 mg/dL — ABNORMAL HIGH (ref 6–23)
CALCIUM: 8.8 mg/dL (ref 8.4–10.5)
CO2: 22 mmol/L (ref 19–32)
CREATININE: 1.65 mg/dL — AB (ref 0.50–1.10)
Chloride: 111 mEq/L (ref 96–112)
GFR calc Af Amer: 29 mL/min — ABNORMAL LOW (ref >90)
GFR calc non Af Amer: 25 mL/min — ABNORMAL LOW (ref >90)
Glucose, Bld: 141 mg/dL — ABNORMAL HIGH (ref 70–99)
Potassium: 5.4 mmol/L — ABNORMAL HIGH (ref 3.5–5.1)
Sodium: 142 mmol/L (ref 135–145)
Total Bilirubin: 1.2 mg/dL (ref 0.3–1.2)
Total Protein: 8.1 g/dL (ref 6.0–8.3)

## 2014-03-03 LAB — URINALYSIS, ROUTINE W REFLEX MICROSCOPIC
BILIRUBIN URINE: NEGATIVE
Glucose, UA: NEGATIVE mg/dL
Ketones, ur: NEGATIVE mg/dL
Nitrite: POSITIVE — AB
PH: 6.5 (ref 5.0–8.0)
Protein, ur: 30 mg/dL — AB
SPECIFIC GRAVITY, URINE: 1.008 (ref 1.005–1.030)
Urobilinogen, UA: 0.2 mg/dL (ref 0.0–1.0)

## 2014-03-03 LAB — BASIC METABOLIC PANEL
Anion gap: 12 (ref 5–15)
Anion gap: 10 (ref 5–15)
BUN: 43 mg/dL — ABNORMAL HIGH (ref 6–23)
BUN: 42 mg/dL — ABNORMAL HIGH (ref 6–23)
CO2: 23 mmol/L (ref 19–32)
CO2: 26 mmol/L (ref 19–32)
Calcium: 9 mg/dL (ref 8.4–10.5)
Calcium: 9 mg/dL (ref 8.4–10.5)
Chloride: 110 mEq/L (ref 96–112)
Chloride: 108 mEq/L (ref 96–112)
Creatinine, Ser: 1.61 mg/dL — ABNORMAL HIGH (ref 0.50–1.10)
Creatinine, Ser: 1.64 mg/dL — ABNORMAL HIGH (ref 0.50–1.10)
GFR calc Af Amer: 30 mL/min — ABNORMAL LOW (ref >90)
GFR calc Af Amer: 30 mL/min — ABNORMAL LOW (ref >90)
GFR, EST NON AFRICAN AMERICAN: 26 mL/min — AB (ref >90)
GFR, EST NON AFRICAN AMERICAN: 25 mL/min — AB (ref >90)
GLUCOSE: 106 mg/dL — AB (ref 70–99)
GLUCOSE: 105 mg/dL — AB (ref 70–99)
Potassium: 4 mmol/L (ref 3.5–5.1)
Potassium: 3.9 mmol/L (ref 3.5–5.1)
SODIUM: 145 mmol/L (ref 135–145)
SODIUM: 144 mmol/L (ref 135–145)

## 2014-03-03 LAB — I-STAT ARTERIAL BLOOD GAS, ED
ACID-BASE DEFICIT: 4 mmol/L — AB (ref 0.0–2.0)
BICARBONATE: 20.8 meq/L (ref 20.0–24.0)
O2 Saturation: 90 %
TCO2: 22 mmol/L (ref 0–100)
pCO2 arterial: 37.9 mmHg (ref 35.0–45.0)
pH, Arterial: 7.348 — ABNORMAL LOW (ref 7.350–7.450)
pO2, Arterial: 62 mmHg — ABNORMAL LOW (ref 80.0–100.0)

## 2014-03-03 LAB — CBC WITH DIFFERENTIAL/PLATELET
BASOS PCT: 0 % (ref 0–1)
Basophils Absolute: 0 10*3/uL (ref 0.0–0.1)
EOS PCT: 1 % (ref 0–5)
Eosinophils Absolute: 0.2 10*3/uL (ref 0.0–0.7)
HCT: 36.8 % (ref 36.0–46.0)
Hemoglobin: 11.3 g/dL — ABNORMAL LOW (ref 12.0–15.0)
Lymphocytes Relative: 10 % — ABNORMAL LOW (ref 12–46)
Lymphs Abs: 1.1 10*3/uL (ref 0.7–4.0)
MCH: 29 pg (ref 26.0–34.0)
MCHC: 30.7 g/dL (ref 30.0–36.0)
MCV: 94.4 fL (ref 78.0–100.0)
Monocytes Absolute: 0.9 10*3/uL (ref 0.1–1.0)
Monocytes Relative: 8 % (ref 3–12)
Neutro Abs: 9.3 10*3/uL — ABNORMAL HIGH (ref 1.7–7.7)
Neutrophils Relative %: 81 % — ABNORMAL HIGH (ref 43–77)
Platelets: 277 10*3/uL (ref 150–400)
RBC: 3.9 MIL/uL (ref 3.87–5.11)
RDW: 13.7 % (ref 11.5–15.5)
WBC: 11.4 10*3/uL — ABNORMAL HIGH (ref 4.0–10.5)

## 2014-03-03 LAB — URINE MICROSCOPIC-ADD ON

## 2014-03-03 LAB — I-STAT CG4 LACTIC ACID, ED: LACTIC ACID, VENOUS: 3.68 mmol/L — AB (ref 0.5–2.2)

## 2014-03-03 LAB — TROPONIN I: Troponin I: 0.03 ng/mL (ref <0.031)

## 2014-03-03 LAB — MRSA PCR SCREENING: MRSA BY PCR: POSITIVE — AB

## 2014-03-03 LAB — BRAIN NATRIURETIC PEPTIDE: B NATRIURETIC PEPTIDE 5: 1415.2 pg/mL — AB (ref 0.0–100.0)

## 2014-03-03 LAB — LACTIC ACID, PLASMA: Lactic Acid, Venous: 2 mmol/L (ref 0.5–2.2)

## 2014-03-03 MED ORDER — TRAMADOL HCL 50 MG PO TABS: 50.0000 mg | ORAL_TABLET | Freq: Two times a day (BID) | ORAL | Status: DC | PRN

## 2014-03-03 MED ORDER — PANTOPRAZOLE SODIUM 40 MG PO TBEC: 40.0000 mg | DELAYED_RELEASE_TABLET | Freq: Every day | ORAL | Status: DC

## 2014-03-03 MED ORDER — MUPIROCIN 2 % EX OINT
1.0000 "application " | TOPICAL_OINTMENT | Freq: Two times a day (BID) | CUTANEOUS | Status: DC
  Administered 2014-03-03 – 2014-03-06 (×6): 1 via NASAL
  Filled 2014-03-03 (×2): qty 22

## 2014-03-03 MED ORDER — HEPARIN SODIUM (PORCINE) 5000 UNIT/ML IJ SOLN
5000.0000 [IU] | Freq: Three times a day (TID) | INTRAMUSCULAR | Status: DC
  Administered 2014-03-03: 5000 [IU] via SUBCUTANEOUS
  Filled 2014-03-03: qty 1

## 2014-03-03 MED ORDER — FUROSEMIDE 10 MG/ML IJ SOLN
40.0000 mg | Freq: Four times a day (QID) | INTRAMUSCULAR | Status: DC
  Administered 2014-03-03: 40 mg via INTRAVENOUS
  Filled 2014-03-03 (×2): qty 4

## 2014-03-03 MED ORDER — FENTANYL CITRATE 0.05 MG/ML IJ SOLN
12.5000 ug | INTRAMUSCULAR | Status: DC | PRN
  Administered 2014-03-03 – 2014-03-04 (×3): 12.5 ug via INTRAVENOUS
  Filled 2014-03-03 (×3): qty 2

## 2014-03-03 MED ORDER — PINDOLOL 5 MG PO TABS
5.0000 mg | ORAL_TABLET | Freq: Every day | ORAL | Status: DC
  Filled 2014-03-03 (×3): qty 1

## 2014-03-03 MED ORDER — HYDRALAZINE HCL 20 MG/ML IJ SOLN
10.0000 mg | Freq: Four times a day (QID) | INTRAMUSCULAR | Status: DC | PRN
  Filled 2014-03-03: qty 1

## 2014-03-03 MED ORDER — HEPARIN SODIUM (PORCINE) 5000 UNIT/ML IJ SOLN
5000.0000 [IU] | Freq: Three times a day (TID) | INTRAMUSCULAR | Status: DC
  Administered 2014-03-04 – 2014-03-06 (×8): 5000 [IU] via SUBCUTANEOUS
  Filled 2014-03-03 (×10): qty 1

## 2014-03-03 MED ORDER — CHLORHEXIDINE GLUCONATE CLOTH 2 % EX PADS
6.0000 | MEDICATED_PAD | Freq: Every day | CUTANEOUS | Status: DC
  Administered 2014-03-04 – 2014-03-06 (×3): 6 via TOPICAL

## 2014-03-03 MED ORDER — SILVER SULFADIAZINE 1 % EX CREA
1.0000 "application " | TOPICAL_CREAM | Freq: Every day | CUTANEOUS | Status: DC
  Administered 2014-03-03 – 2014-03-05 (×3): 1 via TOPICAL
  Filled 2014-03-03: qty 85

## 2014-03-03 MED ORDER — FUROSEMIDE 10 MG/ML IJ SOLN
40.0000 mg | Freq: Four times a day (QID) | INTRAMUSCULAR | Status: DC
  Administered 2014-03-03 – 2014-03-04 (×2): 40 mg via INTRAVENOUS
  Filled 2014-03-03 (×5): qty 4

## 2014-03-03 MED ORDER — METOPROLOL TARTRATE 1 MG/ML IV SOLN
5.0000 mg | Freq: Two times a day (BID) | INTRAVENOUS | Status: DC
  Administered 2014-03-03 – 2014-03-04 (×2): 5 mg via INTRAVENOUS
  Filled 2014-03-03 (×4): qty 5

## 2014-03-03 MED ORDER — SODIUM CHLORIDE 0.9 % IJ SOLN
3.0000 mL | Freq: Two times a day (BID) | INTRAMUSCULAR | Status: DC
  Administered 2014-03-06: 3 mL via INTRAVENOUS

## 2014-03-03 MED ORDER — HYDRALAZINE HCL 50 MG PO TABS
50.0000 mg | ORAL_TABLET | Freq: Four times a day (QID) | ORAL | Status: DC
  Filled 2014-03-03 (×4): qty 1

## 2014-03-03 MED ORDER — MELATONIN 5 MG PO TABS: 10.0000 mg | ORAL_TABLET | Freq: Every day | ORAL | Status: DC

## 2014-03-03 MED ORDER — PINDOLOL 5 MG PO TABS: 2.5000 mg | ORAL_TABLET | ORAL | Status: DC

## 2014-03-03 MED ORDER — PINDOLOL 5 MG PO TABS
2.5000 mg | ORAL_TABLET | Freq: Every day | ORAL | Status: DC
  Filled 2014-03-03 (×2): qty 1

## 2014-03-03 MED ORDER — SODIUM CHLORIDE 0.9 % IJ SOLN
3.0000 mL | Freq: Two times a day (BID) | INTRAMUSCULAR | Status: DC
  Administered 2014-03-03 – 2014-03-05 (×5): 3 mL via INTRAVENOUS

## 2014-03-03 MED ORDER — FUROSEMIDE 10 MG/ML IJ SOLN
60.0000 mg | Freq: Once | INTRAMUSCULAR | Status: AC
  Administered 2014-03-03: 60 mg via INTRAVENOUS
  Filled 2014-03-03: qty 6

## 2014-03-03 MED ORDER — SODIUM CHLORIDE 0.9 % IV SOLN: 250.0000 mL | INTRAVENOUS | Status: DC | PRN

## 2014-03-03 MED ORDER — ISOSORBIDE MONONITRATE 15 MG HALF TABLET
15.0000 mg | ORAL_TABLET | Freq: Every day | ORAL | Status: DC
  Administered 2014-03-04 – 2014-03-06 (×3): 15 mg via ORAL
  Filled 2014-03-03 (×5): qty 1

## 2014-03-03 MED ORDER — DEXTROSE 5 % IV SOLN
1.0000 g | INTRAVENOUS | Status: DC
  Administered 2014-03-03 – 2014-03-05 (×3): 1 g via INTRAVENOUS
  Filled 2014-03-03 (×4): qty 10

## 2014-03-03 MED ORDER — NITROGLYCERIN 0.4 MG SL SUBL: 0.4000 mg | SUBLINGUAL_TABLET | SUBLINGUAL | Status: DC | PRN

## 2014-03-03 MED ORDER — PANTOPRAZOLE SODIUM 40 MG IV SOLR
40.0000 mg | Freq: Every day | INTRAVENOUS | Status: DC
  Administered 2014-03-03: 40 mg via INTRAVENOUS
  Filled 2014-03-03 (×2): qty 40

## 2014-03-03 MED ORDER — SODIUM CHLORIDE 0.9 % IJ SOLN: 3.0000 mL | INTRAMUSCULAR | Status: DC | PRN

## 2014-03-03 MED ORDER — ASPIRIN EC 81 MG PO TBEC
81.0000 mg | DELAYED_RELEASE_TABLET | Freq: Every day | ORAL | Status: DC
  Administered 2014-03-04 – 2014-03-06 (×3): 81 mg via ORAL
  Filled 2014-03-03 (×5): qty 1

## 2014-03-03 NOTE — Progress Notes (Signed)
Patient placed back on BiPAP due to decrease in sat of 87% and increase WOB. Will continue to monitor.

## 2014-03-03 NOTE — ED Notes (Signed)
Bipap in place. Pt resting with dtr at bedside.

## 2014-03-03 NOTE — ED Notes (Signed)
Attempting to call report to 3S RN, on hold waiting for RN to come out of pt room. Consulting civil engineerCharge RN in same room as RN, both unable to take report at this moment.

## 2014-03-03 NOTE — H&P (Signed)
[facility] Admission History and Physical Service Pager: [phone]  Patient name: Yvette [name] record number: [id number] Date of birth: 1916-07-29 Age: 80 y.o. Gender: female  Primary Care Provider: Christa See, MD Consultants: None Code Status: Full  Chief Complaint: dCHF Exacerbation  Assessment and Plan: Yvette [name] is a 80 y.o. female presenting with dCHF exacerbation. PMH is significant for dementia, HTN, CKD, anxiety, paroxysmal atrial flutter, dCHF, and sick sinus syndrome.  #Acute Respiratory failure, possible due to Acute on Chronic diastolic CHF exacerbation - Echo performed on 01/06/14 showing grade one diastolic CHF w/ EF of 75-17%.  Exam today does not show too much fluid overload.  Not convinced this is truly the only cause of her respiratory failure.  Dry weight according to last cardiology note from 10/29 at 88 lbs.  Currently 94 lbs on ED scale  - Would switch Pindolol (unsure why she is on this) to Coreg/Toprol XL if BP/HR can handle  - Would consider Duplex/CTA for DVT/PE evaluation if continues to be tachycardic (Well's score of 3)  - Continue on ASA, hydralazine, Imdur.  Not on ACE due to renal fxn most likely  - Start Lasix IV 40 mg q 6 hrs and reassess fluid status and BMET.  Switch to PO when tolerates - Daily weights, I/O's, fluid restrictions, salt limitations  - Do not think repeat Echo at this point would be beneficial but could repeat if clinically changes  - Would hold Megace D/C as will cause fluid retention and raise DVT risk in this low mobility pt - BMET later today to evaluate K+ and creatinine  - Per RT, adjust BiPAP.  Would take her off unless concern for airway protection, in which case will need to discuss for possible intubation with ICU team - Palliative consult for [place] and end of life discussion   # Non anion gap Metabolic Acidosis - Well compensated according to ABG drawn in ED.  Does have elevated Lactic  Acid, which could shift her to having combined AG/Non AG Met Acidosis.   - Most likely 2/2 to RTA type IV by high K+ and low Bicarb on BMET w/ normal ABG - Daily BMETs  - Repeat ABG as indicated   #Hx of STEMI  - Continue with ASA, BB (will not stop at this point but could consider stopping if true CHF exacerbation) - EKG as needed if CP - Trop negative x 1, can cycle if develops CP   #HTN  - Continue current medications  #Hx of A fib/Sick Sinus Syndrome  - High fall risk, no anticoag due to this reason - Currently in NSR, monitor   #Leukocytosis/SIRS  - Does have Tachycardia as well which would make her SIRS criteria but low clinical suspicion of current infxn  - Monitor daily, could just be stress reaction  - If S/Sx of infxn, draw BCx, repeat CXR (would obtain lateral view to evaluate for RML PNA), CBC  #CKD Stage III - IV - Baseline creatinine around 1.6, currently at baseline - Monitor closely with BMETs - Doubt current component of cardiorenal syndrome - Limit nephrotoxic agents   #Hyperkalemia - 2/2 renal failure vs acidosis - Repeat BMET q 6 hrs x 2 - Tx if indicated   FEN/GI: SLIV, Heart Healthy Diet Prophylaxis: Heparin SQ  Disposition: SDU, pending further w/u   History of Present Illness: Yvette [name] is a 80 y.o. female presenting with acute respiratory failure.  Much of the history obtained by daughter as  pt on BiPAP and does not speak [place].  Pt has been more SOB over the past 12 hrs when awakening while shortness of breath.  She had trouble laying flat but denied any CP, HA, worsening LE edema.   Dry weight is around 95 lbs but she was 88 lbs upon d/c from hospital about 3 months ago.  Denies unilateral leg edema, pleuritic CP, radiating CP, N/V/D.  She does have hx of diastolic CHF with multiple admissions over the past year.    In the ED, pt had multiple evals completed including BMET showing creatinine of 1.6 (baseline), K+ of 5.4, BNP pending, CXR showing  pulmonary edema, EKG unchanged from previous.  She was started to BiPAP due to possible tachypnea.  ABG showed pH of 7.345, CO 2 of 38, Bicarb of 20, O2 of 62.    Review Of Systems: Per HPI   Patient Active Problem List   Diagnosis Date Noted  . Tear of anal skin 01/12/2014  . Elevated troponin- demand ischemia 01/07/2014  . Flash pulmonary edema 01/05/2014  . Acute respiratory failure 01/05/2014  . Healthcare-associated pneumonia 01/05/2014  . Cataracts, bilateral, very poor vision 12/25/2013  . Poor vision 12/25/2013  . Impaired mobility and ADLs 12/25/2013  . Hearing loss 12/25/2013  . Cerumen impaction 12/25/2013  . Dementia 12/25/2013  . Circadian rhythm disorder 12/25/2013  . Chronic indwelling Foley catheter 11/10/2013  . Unstable angina, history of 10/04/2012  . History of ST elevation myocardial infarction (STEMI) 09/24/2012  . Vertebral fracture 07/28/2012  . CAD- STEMI 2012 07/25/2012  . Urinary retention 07/25/2012  . Acute on chronic diastolic CHF (congestive heart failure) 10/05/2011  . HTN (hypertension) 10/05/2011  . Chronic kidney disease (CKD), stage IV (severe) 04/26/2011  . Anxiety 05/05/2010  . Paroxysmal atrial flutter   . Chronic diastolic heart failure   . Sick sinus syndrome   . BRADYCARDIA-TACHYCARDIA SYNDROME 05/02/2010   Past Medical History: Past Medical History  Diagnosis Date  . Paroxysmal atrial flutter [facility] admission 2/5-04/20/2010    High fall risk-not a Coumadin candidate  . Hypertension   . Chronic diastolic heart failure     a. Echo February 2012: EF 55%; moderate LVH; mild AI; mild MR; PASP 39  . Sick sinus syndrome     a. Limits use of beta blocker-pindolol tolerated  . CAD (coronary artery disease) PRESUMED    a. 04/2010 NSTEMI->med rx;  b. 09/2012 inf stemi->med rx.  . Arthritis   . Anxiety   . Hydronephrosis     Chronic  . Chronic back pain   . Stroke   . Herpes zoster 03/01/2012  . Myocardial infarction   . CHF (congestive  heart failure)   . Chronic indwelling Foley catheter 11/10/2013  . Cataracts, bilateral, very poor vision 12/25/2013  . UTI (urinary tract infection) 10/04/2012  . Unstable angina 10/04/2012  . SOB (shortness of breath) 10/05/2011  . Respiratory distress 11/10/2013  . Protein-calorie malnutrition, severe 11/11/2013  . Physical deconditioning 04/26/2011  . Loss of weight 08/29/2012  . Fatigue 10/04/2011  . Encounter for palliative care 07/24/2012  . Acute on chronic diastolic CHF (congestive heart failure), NYHA class 1 10/05/2011  . History of ST elevation myocardial infarction (STEMI) 09/24/2012   Past Surgical History: Past Surgical History  Procedure Laterality Date  . Esophagogastroduodenoscopy  04/19/2011    Procedure: ESOPHAGOGASTRODUODENOSCOPY (EGD);  Surgeon: Zenovia Jarred, MD;  Location: [facility] ENDOSCOPY;  Service: Gastroenterology;  Laterality: N/A;   Social History: History  Substance Use  Topics  . Smoking status: Never Smoker   . Smokeless tobacco: Never Used  . Alcohol Use: No    Family History: Family History  Problem Relation Age of Onset  . CAD Neg Hx    Allergies and Medications: No Known Allergies No current facility-administered medications on file prior to encounter.   Current Outpatient Prescriptions on File Prior to Encounter  Medication Sig Dispense Refill  . aspirin EC 81 MG tablet Take 81 mg by mouth daily.    . diclofenac sodium (VOLTAREN) 1 % GEL Apply 2 g topically 4 (four) times daily. 1 Tube 5  . hydrALAZINE (APRESOLINE) 50 MG tablet Take 50 mg by mouth 4 (four) times daily.    . isosorbide mononitrate (IMDUR) 30 MG 24 hr tablet Take 15 mg by mouth daily.    . megestrol (MEGACE) 40 MG tablet TAKE 4 TABLETS (160 MG TOTAL) BY MOUTH DAILY. 120 tablet 3  . Melatonin 5 MG TABS Take 10 mg by mouth at bedtime.     . Meth-Hyo-M Bl-Na Phos-Ph Sal (URIBEL) 118 MG CAPS Take 1 capsule by mouth 4 (four) times daily.    . Multiple Vitamin (MULTIVITAMIN WITH MINERALS) TABS  tablet Take 1 tablet by mouth daily.    . nitroGLYCERIN (NITROSTAT) 0.4 MG SL tablet Place 1 tablet (0.4 mg total) under the tongue every 5 (five) minutes as needed for chest pain. 25 tablet 3  . pantoprazole (PROTONIX) 40 MG tablet Take 40 mg by mouth daily.    . pindolol (VISKEN) 5 MG tablet Take 2.5-5 mg by mouth See admin instructions. Take 1 tablet in the morning and 0.5 tablet in the evening.    . silver sulfADIAZINE (SILVADENE) 1 % cream Apply 1 application topically daily. To affected skin areas. 50 g 1  . torsemide (DEMADEX) 20 MG tablet Take 4 times a week as directed (Patient taking differently: Take 20 mg by mouth See admin instructions. Take 1 tablet on Tuesday, Wednesday, Friday and Saturday) 48 tablet 3  . levofloxacin (LEVAQUIN) 500 MG tablet Take 1 tablet (500 mg total) by mouth every other day. (Patient not taking: Reported on 03/03/2014) 4 tablet 0    Objective: BP 145/61 mmHg  Pulse 90  Temp(Src) 98.6 F (37 C) (Axillary)  Resp 20  Wt 94 lb (42.638 kg)  SpO2 99% Exam: General: NAD, on BiPAP HEENT: [place]/AT Neck : JVD 8-9 cm b/L  Cardiovascular: RRR, 1/6 diastolic RUSB murmur Respiratory: coarse breath sounds b/l, no crackles/rales appreciated.  No increased WOB or accessory muscle use while on BiPAP Abdomen: S/NT/ND, NABS Extremities: trace edema B/L  Skin: No rashes or skin breakdown present  Neuro: No focal deficits   Labs and Imaging: CBC BMET   Recent Labs Lab 03/03/14 0643  WBC 11.4*  HGB 11.3*  HCT 36.8  PLT 277    Recent Labs Lab 03/03/14 0643  NA 142  K 5.4*  CL 111  CO2 22  BUN 43*  CREATININE 1.65*  GLUCOSE 141*  CALCIUM 8.8     CXR - Pulmonary Congestion   EKG - LVH, Sinus tachycardia, widened QRS in inferior leads.  Similar to previous EKGs performed   Nolon Rod, DO 03/03/2014, 8:06 AM PGY-3, [place] [facility] pager: [phone], text pages welcome

## 2014-03-03 NOTE — ED Notes (Signed)
Patient arrived via EMS.  EMS reported they were called for resp distress.  Fire reported that this has happened several times over the last 4 months.  EMS had CPAP on her upon arrival.  Moved the CPAP to move her and sat dropped to 89%

## 2014-03-03 NOTE — ED Notes (Signed)
Spoke with Diplomatic Services operational officersecretary for [employer]. RN unable to take report at this time. Will call back

## 2014-03-03 NOTE — ED Notes (Signed)
Pt taken off Bipap by respiratory per MD order. Pt on5L [place]. Pt's sats 90-91%

## 2014-03-03 NOTE — ED Notes (Signed)
Report given to 3S RN [place] [facility]. Will transport pt.

## 2014-03-03 NOTE — ED Provider Notes (Signed)
CSN: [id number]     Arrival date & time 03/03/14  4098 History   First MD Initiated Contact with Patient 03/03/14 [phone]     Chief Complaint  Patient presents with  . Respiratory Distress    Level V caveat: Respiratory distress  The history is provided by medical records and a relative.   his for the patient had increased shortness of breath in the middle the night.  Patient has a history of paroxysmal atrial flutter and chronic diastolic heart failure.  She also has a history of coronary artery disease.  She denies chest pain at this time.  No recent illness per daughter.  No recent productive cough.  No recent dietary indiscretion.  Patient was found to be hypoxic in respiratory distress by EMS.  Patient was transported to the emergency department on Cipro which EMS reports has improved her respiratory rate and her work of breathing.  Family reports the patient is a full code  Past Medical History  Diagnosis Date  . Paroxysmal atrial flutter [facility] admission 2/5-04/20/2010    High fall risk-not a Coumadin candidate  . Hypertension   . Chronic diastolic heart failure     a. Echo February 2012: EF 55%; moderate LVH; mild AI; mild MR; PASP 39  . Sick sinus syndrome     a. Limits use of beta blocker-pindolol tolerated  . CAD (coronary artery disease) PRESUMED    a. 04/2010 NSTEMI->med rx;  b. 09/2012 inf stemi->med rx.  . Arthritis   . Anxiety   . Hydronephrosis     Chronic  . Chronic back pain   . Stroke   . Herpes zoster 03/01/2012  . Myocardial infarction   . CHF (congestive heart failure)   . Chronic indwelling Foley catheter 11/10/2013  . Cataracts, bilateral, very poor vision 12/25/2013  . UTI (urinary tract infection) 10/04/2012  . Unstable angina 10/04/2012  . SOB (shortness of breath) 10/05/2011  . Respiratory distress 11/10/2013  . Protein-calorie malnutrition, severe 11/11/2013  . Physical deconditioning 04/26/2011  . Loss of weight 08/29/2012  . Fatigue 10/04/2011  . Encounter for  palliative care 07/24/2012  . Acute on chronic diastolic CHF (congestive heart failure), NYHA class 1 10/05/2011  . History of ST elevation myocardial infarction (STEMI) 09/24/2012   Past Surgical History  Procedure Laterality Date  . Esophagogastroduodenoscopy  04/19/2011    Procedure: ESOPHAGOGASTRODUODENOSCOPY (EGD);  Surgeon: Erick Blinks, MD;  Location[facility] ENDOSCOPY;  Service: Gastroenterology;  Laterality: N/A;   Family History  Problem Relation Age of Onset  . CAD Neg Hx    History  Substance Use Topics  . Smoking status: Never Smoker   . Smokeless tobacco: Never Used  . Alcohol Use: No   OB History    No data available     Review of Systems  Unable to perform ROS: Severe respiratory distress      Allergies  Review of patient's allergies indicates no known allergies.  Home Medications   Prior to Admission medications   Medication Sig Start Date End Date Taking? Authorizing Provider  aspirin EC 81 MG tablet Take 81 mg by mouth daily.   Yes Historical Provider, MD  diclofenac sodium (VOLTAREN) 1 % GEL Apply 2 g topically 4 (four) times daily. 10/03/13  Yes Stephanie Coup Street, MD  hydrALAZINE (APRESOLINE) 50 MG tablet Take 50 mg by mouth 4 (four) times daily.   Yes Historical Provider, MD  isosorbide mononitrate (IMDUR) 30 MG 24 hr tablet Take 15 mg by mouth daily.  Yes Historical Provider, MD  megestrol (MEGACE) 40 MG tablet TAKE 4 TABLETS (160 MG TOTAL) BY MOUTH DAILY. 02/26/14  Yes Rollene RotundaJames Hochrein, MD  Melatonin 5 MG TABS Take 10 mg by mouth at bedtime.    Yes Historical Provider, MD  Meth-Hyo-M Bl-Na Phos-Ph Sal (URIBEL) 118 MG CAPS Take 1 capsule by mouth 4 (four) times daily.   Yes Historical Provider, MD  Multiple Vitamin (MULTIVITAMIN WITH MINERALS) TABS tablet Take 1 tablet by mouth daily.   Yes Historical Provider, MD  nitroGLYCERIN (NITROSTAT) 0.4 MG SL tablet Place 1 tablet (0.4 mg total) under the tongue every 5 (five) minutes as needed for chest pain. 10/04/12   Yes Ok Anishristopher R Berge, NP  pantoprazole (PROTONIX) 40 MG tablet Take 40 mg by mouth daily.   Yes Historical Provider, MD  pindolol (VISKEN) 5 MG tablet Take 2.5-5 mg by mouth See admin instructions. Take 1 tablet in the morning and 0.5 tablet in the evening.   Yes Historical Provider, MD  silver sulfADIAZINE (SILVADENE) 1 % cream Apply 1 application topically daily. To affected skin areas. 01/14/14  Yes Stephanie Couphristopher M Street, MD  torsemide (DEMADEX) 20 MG tablet Take 4 times a week as directed Patient taking differently: Take 20 mg by mouth See admin instructions. Take 1 tablet on Tuesday, Wednesday, Friday and Saturday 02/25/14  Yes Rollene RotundaJames Hochrein, MD  levofloxacin (LEVAQUIN) 500 MG tablet Take 1 tablet (500 mg total) by mouth every other day. Patient not taking: Reported on 03/03/2014 01/09/14   Kathee DeltonIan D McKeag, MD   BP 145/61 mmHg  Pulse 90  Temp(Src) 98.6 F (37 C) (Axillary)  Resp 20  Wt 94 lb (42.638 kg)  SpO2 99% Physical Exam  Constitutional: She is oriented to person, place, and time. She appears well-developed and well-nourished. She appears distressed.  HENT:  Head: Normocephalic and atraumatic.  Eyes: EOM are normal.  Neck: Normal range of motion.  Cardiovascular: Regular rhythm and normal heart sounds.   Tachycardic  Pulmonary/Chest:  Rales and rhonchi bilaterally.  Accessory muscle use.  Tachypnea  Abdominal: Soft. She exhibits no distension. There is no tenderness.  Musculoskeletal: Normal range of motion. She exhibits no edema.  Neurological: She is alert and oriented to person, place, and time.  Skin: Skin is warm and dry.  Psychiatric: She has a normal mood and affect. Judgment normal.  Nursing note and vitals reviewed.   ED Course  Procedures (including critical care time) CRITICAL CARE Performed by: Lyanne CoAMPOS,Madysun Thall M Total critical care time: 32 Critical care time was exclusive of separately billable procedures and treating other patients. Critical care was  necessary to treat or prevent imminent or life-threatening deterioration. Critical care was time spent personally by me on the following activities: development of treatment plan with patient and/or surrogate as well as nursing, discussions with consultants, evaluation of patient's response to treatment, examination of patient, obtaining history from patient or surrogate, ordering and performing treatments and interventions, ordering and review of laboratory studies, ordering and review of radiographic studies, pulse oximetry and re-evaluation of patient's condition.   Labs Review Labs Reviewed  CBC WITH DIFFERENTIAL - Abnormal; Notable for the following:    WBC 11.4 (*)    Hemoglobin 11.3 (*)    Neutrophils Relative % 81 (*)    Neutro Abs 9.3 (*)    Lymphocytes Relative 10 (*)    All other components within normal limits  COMPREHENSIVE METABOLIC PANEL - Abnormal; Notable for the following:    Potassium 5.4 (*)  Glucose, Bld 141 (*)    BUN 43 (*)    Creatinine, Ser 1.65 (*)    AST 45 (*)    GFR calc non Af Amer 25 (*)    GFR calc Af Amer 29 (*)    All other components within normal limits  I-STAT ARTERIAL BLOOD GAS, ED - Abnormal; Notable for the following:    pH, Arterial 7.348 (*)    pO2, Arterial 62.0 (*)    Acid-base deficit 4.0 (*)    All other components within normal limits  I-STAT CG4 LACTIC ACID, ED - Abnormal; Notable for the following:    Lactic Acid, Venous 3.68 (*)    All other components within normal limits  TROPONIN I  BRAIN NATRIURETIC PEPTIDE  LACTIC ACID, PLASMA    Imaging Review Dg Chest Portable 1 View  03/03/2014   CLINICAL DATA:  Difficulty breathing  EXAM: PORTABLE CHEST - 1 VIEW  COMPARISON:  January 21, 2014  FINDINGS: There is cardiomegaly with mild pulmonary venous hypertension and interstitial edema. There is no airspace consolidation. There is atherosclerotic change in the aorta. No adenopathy. There are surgical clips in left axilla. Bones are  osteoporotic.  IMPRESSION: Evidence of a degree of congestive heart failure. No airspace consolidation.   Electronically Signed   By: Bretta BangWilliam  Woodruff M.D.   On: 03/03/2014 07:01     EKG Interpretation   Date/Time:  Tuesday March 03 2014 06:41:45 EST Ventricular Rate:  105 PR Interval:  147 QRS Duration: 82 QT Interval:  359 QTC Calculation: 474 R Axis:   72 Text Interpretation:  Sinus tachycardia Probable LVH with secondary repol  abnrm No significant change was found Confirmed by Cylan Borum  MD, Kierstin January  ([id number]) on 03/03/2014 7:20:31 AM      MDM   Final diagnoses:  SOB (shortness of breath)    Severe respiratory distress on arrival.  Switched over from C Pap and found to be hypoxic.  Placed on BiPAP.  With management and BiPAP in the emergency department the patient seems to be doing much better.  I think she can be admitted to the stepdown unit.  Suspect acute on chronic diastolic heart failure.  IV Lasix given.  Spoke with the admitting team who will admit to stepdown.  I do not think she needs to be intubated at this time.  She appears to be improving.  Stepdown unit should be appropriate.    Lyanne CoKevin M Sanay Belmar, MD 03/04/14 [phone]

## 2014-03-03 NOTE — Progress Notes (Signed)
Utilization review completed.  

## 2014-03-03 NOTE — ED Notes (Signed)
Patient arrived via EMS with foley in.  Daughter stated that she is unable to void and the is the reason for the foley

## 2014-03-03 NOTE — ED Notes (Signed)
Spoke with Kennith Centerracey in Pharmacy, will verify all pt meds and send them to [facility]

## 2014-03-04 DIAGNOSIS — I252 Old myocardial infarction: Secondary | ICD-10-CM

## 2014-03-04 DIAGNOSIS — J9600 Acute respiratory failure, unspecified whether with hypoxia or hypercapnia: Secondary | ICD-10-CM | POA: Insufficient documentation

## 2014-03-04 DIAGNOSIS — I495 Sick sinus syndrome: Secondary | ICD-10-CM

## 2014-03-04 DIAGNOSIS — F0391 Unspecified dementia with behavioral disturbance: Secondary | ICD-10-CM

## 2014-03-04 DIAGNOSIS — I4891 Unspecified atrial fibrillation: Secondary | ICD-10-CM | POA: Insufficient documentation

## 2014-03-04 DIAGNOSIS — F0390 Unspecified dementia without behavioral disturbance: Secondary | ICD-10-CM

## 2014-03-04 LAB — CBC WITH DIFFERENTIAL/PLATELET
Basophils Absolute: 0 10*3/uL (ref 0.0–0.1)
Basophils Relative: 0 % (ref 0–1)
EOS ABS: 0.1 10*3/uL (ref 0.0–0.7)
EOS PCT: 1 % (ref 0–5)
HEMATOCRIT: 38.9 % (ref 36.0–46.0)
HEMOGLOBIN: 12.1 g/dL (ref 12.0–15.0)
LYMPHS ABS: 1.8 10*3/uL (ref 0.7–4.0)
Lymphocytes Relative: 23 % (ref 12–46)
MCH: 29.1 pg (ref 26.0–34.0)
MCHC: 31.1 g/dL (ref 30.0–36.0)
MCV: 93.5 fL (ref 78.0–100.0)
MONO ABS: 0.8 10*3/uL (ref 0.1–1.0)
Monocytes Relative: 10 % (ref 3–12)
Neutro Abs: 5.1 10*3/uL (ref 1.7–7.7)
Neutrophils Relative %: 66 % (ref 43–77)
Platelets: 304 10*3/uL (ref 150–400)
RBC: 4.16 MIL/uL (ref 3.87–5.11)
RDW: 13.4 % (ref 11.5–15.5)
WBC: 7.7 10*3/uL (ref 4.0–10.5)

## 2014-03-04 LAB — COMPREHENSIVE METABOLIC PANEL
ALBUMIN: 3.8 g/dL (ref 3.5–5.2)
ALT: 15 U/L (ref 0–35)
AST: 27 U/L (ref 0–37)
Alkaline Phosphatase: 67 U/L (ref 39–117)
Anion gap: 13 (ref 5–15)
BUN: 44 mg/dL — ABNORMAL HIGH (ref 6–23)
CALCIUM: 9.1 mg/dL (ref 8.4–10.5)
CO2: 25 mmol/L (ref 19–32)
Chloride: 102 mEq/L (ref 96–112)
Creatinine, Ser: 1.82 mg/dL — ABNORMAL HIGH (ref 0.50–1.10)
GFR calc Af Amer: 26 mL/min — ABNORMAL LOW (ref >90)
GFR calc non Af Amer: 22 mL/min — ABNORMAL LOW (ref >90)
Glucose, Bld: 108 mg/dL — ABNORMAL HIGH (ref 70–99)
Potassium: 4.2 mmol/L (ref 3.5–5.1)
SODIUM: 140 mmol/L (ref 135–145)
TOTAL PROTEIN: 9.2 g/dL — AB (ref 6.0–8.3)
Total Bilirubin: 0.7 mg/dL (ref 0.3–1.2)

## 2014-03-04 LAB — BASIC METABOLIC PANEL
Anion gap: 13 (ref 5–15)
BUN: 52 mg/dL — AB (ref 6–23)
CALCIUM: 8.8 mg/dL (ref 8.4–10.5)
CO2: 24 mmol/L (ref 19–32)
Chloride: 106 mEq/L (ref 96–112)
Creatinine, Ser: 2.09 mg/dL — ABNORMAL HIGH (ref 0.50–1.10)
GFR calc Af Amer: 22 mL/min — ABNORMAL LOW (ref >90)
GFR calc non Af Amer: 19 mL/min — ABNORMAL LOW (ref >90)
GLUCOSE: 122 mg/dL — AB (ref 70–99)
Potassium: 4.3 mmol/L (ref 3.5–5.1)
Sodium: 143 mmol/L (ref 135–145)

## 2014-03-04 LAB — TROPONIN I
TROPONIN I: 0.14 ng/mL — AB (ref <0.031)
Troponin I: 0.08 ng/mL — ABNORMAL HIGH (ref <0.031)
Troponin I: 0.14 ng/mL — ABNORMAL HIGH (ref <0.031)
Troponin I: 0.15 ng/mL — ABNORMAL HIGH (ref <0.031)

## 2014-03-04 MED ORDER — DEXTROSE 5 % IV SOLN
5.0000 mg/h | INTRAVENOUS | Status: DC
  Administered 2014-03-04: 15 mg/h via INTRAVENOUS
  Administered 2014-03-04: 5 mg/h via INTRAVENOUS
  Filled 2014-03-04: qty 100

## 2014-03-04 MED ORDER — ENSURE PUDDING PO PUDG
1.0000 | Freq: Three times a day (TID) | ORAL | Status: DC
  Administered 2014-03-04 – 2014-03-06 (×7): 1 via ORAL

## 2014-03-04 MED ORDER — PANTOPRAZOLE SODIUM 40 MG PO PACK
40.0000 mg | PACK | Freq: Every day | ORAL | Status: DC
  Administered 2014-03-04 – 2014-03-06 (×3): 40 mg via ORAL
  Filled 2014-03-04 (×3): qty 20

## 2014-03-04 MED ORDER — ACETAMINOPHEN 325 MG PO TABS: 325.0000 mg | ORAL_TABLET | ORAL | Status: DC | PRN

## 2014-03-04 MED ORDER — DILTIAZEM HCL 90 MG PO TABS
90.0000 mg | ORAL_TABLET | Freq: Four times a day (QID) | ORAL | Status: DC
  Administered 2014-03-04 – 2014-03-06 (×8): 90 mg via ORAL
  Filled 2014-03-04 (×11): qty 1

## 2014-03-04 MED ORDER — FUROSEMIDE 40 MG PO TABS
40.0000 mg | ORAL_TABLET | Freq: Every day | ORAL | Status: DC
  Administered 2014-03-04 – 2014-03-05 (×2): 40 mg via ORAL
  Filled 2014-03-04 (×2): qty 1

## 2014-03-04 MED ORDER — FUROSEMIDE 80 MG PO TABS
80.0000 mg | ORAL_TABLET | Freq: Two times a day (BID) | ORAL | Status: DC
  Filled 2014-03-04 (×2): qty 1

## 2014-03-04 MED ORDER — METOPROLOL TARTRATE 12.5 MG HALF TABLET
12.5000 mg | ORAL_TABLET | Freq: Two times a day (BID) | ORAL | Status: DC
Start: 2014-03-04 — End: 2014-03-06
  Administered 2014-03-04 – 2014-03-06 (×4): 12.5 mg via ORAL
  Filled 2014-03-04 (×5): qty 1

## 2014-03-04 NOTE — Progress Notes (Signed)
INITIAL NUTRITION ASSESSMENT  DOCUMENTATION CODES Per approved criteria  -Not Applicable   INTERVENTION:  Ensure Pudding PO TID, each supplement provides 170 kcal and 4 grams of protein  NUTRITION DIAGNOSIS: Inadequate oral intake related to respiratory distress as evidenced by NPO status, diet just advanced.   Goal: Intake to meet >90% of estimated nutrition needs.  Monitor:  PO intake, labs, weight trend.  Reason for Assessment: Low Braden  80 y.o. female  Admitting Dx: CHF exacerbation  ASSESSMENT: Admitted on 12/22 with presumed CHF exacerbation. Required BiPAP on admission.   BiPAP has been d/c'ed. Diet advanced to dysphagia 2 with thin liquids S/P SLP evaluation earlier today. Patient's family reports that patient eats normal foods at home, but she just doesn't eat very much.   Nutrition Focused Physical Exam:  Subcutaneous Fat:  Orbital Region: WNL Upper Arm Region: WNL Thoracic and Lumbar Region: NA  Muscle:  Temple Region: WNL Clavicle Bone Region: moderate depletion Clavicle and Acromion Bone Region: mild depletion Scapular Bone Region: NA Dorsal Hand: WNL Patellar Region: WNL Anterior Thigh Region: WNL Posterior Calf Region: WNL  Edema: none  Height: Ht Readings from Last 1 Encounters:  03/03/14 4\' 6"  (1.372 m)    Weight: Wt Readings from Last 1 Encounters:  03/04/14 96 lb 1.9 oz (43.6 kg)    Ideal Body Weight: 40.9 kg  % Ideal Body Weight: 107%  Wt Readings from Last 10 Encounters:  03/04/14 96 lb 1.9 oz (43.6 kg)  01/21/14 94 lb 9.6 oz (42.91 kg)  01/12/14 94 lb (42.638 kg)  01/09/14 89 lb 4.6 oz (40.5 kg)  01/01/14 96 lb (43.545 kg)  12/25/13 95 lb (43.092 kg)  11/18/13 95 lb 4.8 oz (43.228 kg)  11/12/13 88 lb 2.9 oz (40 kg)  07/29/13 95 lb (43.092 kg)  10/04/12 100 lb 8.5 oz (45.6 kg)    Usual Body Weight: 89 lbs  % Usual Body Weight: 108% (? Increased with fluid retention with HF)  BMI:  Body mass index is 23.16  kg/(m^2).  Estimated Nutritional Needs: Kcal: 1100-1200 Protein: 60-70 gm Fluid: 1.2 L  Skin: stage 1 pressure ulcer to buttocks  Diet Order: DIET DYS 2 with thin liquids  EDUCATION NEEDS: -No education needs identified at this time   Intake/Output Summary (Last 24 hours) at 03/04/14 1416 Last data filed at 03/04/14 1300  Gross per 24 hour  Intake 126.21 ml  Output   3277 ml  Net -3150.79 ml    Last BM: PTA   Labs:   Recent Labs Lab 03/03/14 1705 03/03/14 1946 03/04/14 0327  NA 145 144 140  K 4.0 3.9 4.2  CL 110 108 102  CO2 23 26 25   BUN 43* 42* 44*  CREATININE 1.61* 1.64* 1.82*  CALCIUM 9.0 9.0 9.1  GLUCOSE 106* 105* 108*    CBG (last 3)  No results for input(s): GLUCAP in the last 72 hours.  Scheduled Meds: . aspirin EC  81 mg Oral Daily  . cefTRIAXone (ROCEPHIN)  IV  1 g Intravenous Q24H  . Chlorhexidine Gluconate Cloth  6 each Topical Q0600  . diltiazem  90 mg Oral 4 times per day  . furosemide  80 mg Oral BID  . heparin  5,000 Units Subcutaneous 3 times per day  . isosorbide mononitrate  15 mg Oral Daily  . metoprolol tartrate  12.5 mg Oral BID  . mupirocin ointment  1 application Nasal BID  . pantoprazole (PROTONIX) IV  40 mg Intravenous QHS  .  silver sulfADIAZINE  1 application Topical Daily  . sodium chloride  3 mL Intravenous Q12H  . sodium chloride  3 mL Intravenous Q12H    Continuous Infusions:   Past Medical History  Diagnosis Date  . [facility]04[facility]    High fall risk-not a Coumadin candidate  . Hypertension   . Chronic diastolic heart failure     a. Echo February 2012: EF 55%; moderate LVH; mild AI; mild MR; PASP 39  . Sick sinus syndrome     a. Limits use of beta blocker-pindolol tolerated  . CAD (coronary artery disease) PRESUMED    a. 04/2010 NSTEMI->med rx;  b. 09/2012 inf stemi->med rx.  . Arthritis   . Anxiety   . Hydronephrosis     Chronic  . Chronic back pain   . Stroke   . Herpes  zoster 03/01/2012  . Myocardial infarction   . CHF (congestive heart failure)   . Chronic indwelling Foley catheter 11/10/2013  . Cataracts, bilateral, very poor vision 12/25/2013  . UTI (urinary tract infection) 10/04/2012  . Unstable angina 10/04/2012  . SOB (shortness of breath) 10/05/2011  . Respiratory distress 11/10/2013  . Protein-calorie malnutrition, severe 11/11/2013  . Physical deconditioning 04/26/2011  . Loss of weight 08/29/2012  . Fatigue 10/04/2011  . Encounter for palliative care 07/24/2012  . Acute on chronic diastolic CHF (congestive heart failure), NYHA class 1 10/05/2011  . History of ST elevation myocardial infarction (STEMI) 09/24/2012    Past Surgical History  Procedure Laterality Date  . Esophagogastroduodenoscopy  04/19/2011    Procedure: ESOPHAGOGASTRODUODENOSCOPY (EGD);  Surgeon: Erick BlinksJay Pyrtle, MD;  [facility];  Service: Gastroenterology;  Laterality: N/A;     Joaquin CourtsKimberly Harris, RD, LDN, CNSC Pager [phone] After Hours Pager [phone]

## 2014-03-04 NOTE — Progress Notes (Signed)
EKG CRITICAL VALUE     12 lead EKG performed.  Critical value noted.  Osvaldo Humanarole Schiller, RN notified.   Ceri Mayer C, CCT 03/04/2014 9:20 AM

## 2014-03-04 NOTE — Progress Notes (Signed)
Interval progress note  Passed speech eval, for med admin recommended crushed and administered with puree.  Changed IV metoprolol to 12.5mg  PO BID. Changed IV diltiazem drip to 90mg  PO QID (discussed dosing with pharmacy). Added acetaminophen 325mg  q4h prn for leg pain. Changed lasix from 40mg  IV q6 hours to 80mg  PO BID (decreased given mild dry appearance today).  Leona SingletonMaria T Alliana Mcauliff, MD

## 2014-03-04 NOTE — Progress Notes (Addendum)
**  Interval note**  RN paged around 3am because Ms Yvette [name] tachycardic to 150's.  Stat EKG reveals a-fib with RVR.  According to RN patient seemed confused before I arrived, not answering family's questions.  However, when I ask family about this, they say she is responding appropriately now. On exam, patient is turning in her bed, whimpering, heart is tachycardic and irregular, CTAB, breathing on RA with saturations in 90's, per family denying any pain but states that she is tired.  I asked the family what her baseline is and they report that she acts like this at home too.  They voice no concerns at this time.  This appears to be a regular event when she is in the hospital.  Cardizem drip, cbc, bmet, troponins ordered.  I informed the family that Palliative would be by today.  Will also call a cardiology consult this morning.   Above was discussed with the senior resident, Yvette MccreedyBrian Hess, MD.  Rozell SearingAshly M. Nadine CountsGottschalk, DO PGY-1, [facility]

## 2014-03-04 NOTE — Evaluation (Signed)
Clinical/Bedside Swallow Evaluation Patient Details  Name: Yvette [name] Pharo MRN: [id number] Date of Birth: 03/04/1917  Today's Date: 03/04/2014 Time: [id number] SLP Time Calculation (min) (ACUTE ONLY): 20 min  Past Medical History:  Past Medical History  Diagnosis Date  . [facility]    High fall risk-not a Coumadin candidate  . Hypertension   . Chronic diastolic heart failure     a. Echo February 2012: EF 55%; moderate LVH; mild AI; mild MR; PASP 39  . Sick sinus syndrome     a. Limits use of beta blocker-pindolol tolerated  . CAD (coronary artery disease) PRESUMED    a. 04/2010 NSTEMI->med rx;  b. 09/2012 inf stemi->med rx.  . Arthritis   . Anxiety   . Hydronephrosis     Chronic  . Chronic back pain   . Stroke   . Herpes zoster 03/01/2012  . Myocardial infarction   . CHF (congestive heart failure)   . Chronic indwelling Foley catheter 11/10/2013  . Cataracts, bilateral, very poor vision 12/25/2013  . UTI (urinary tract infection) 10/04/2012  . Unstable angina 10/04/2012  . SOB (shortness of breath) 10/05/2011  . Respiratory distress 11/10/2013  . Protein-calorie malnutrition, severe 11/11/2013  . Physical deconditioning 04/26/2011  . Loss of weight 08/29/2012  . Fatigue 10/04/2011  . Encounter for palliative care 07/24/2012  . Acute on chronic diastolic CHF (congestive heart failure), NYHA class 1 10/05/2011  . History of ST elevation myocardial infarction (STEMI) 09/24/2012   Past Surgical History:  Past Surgical History  Procedure Laterality Date  . Esophagogastroduodenoscopy  04/19/2011    Procedure: ESOPHAGOGASTRODUODENOSCOPY (EGD);  Surgeon: Erick BlinksJay Pyrtle, MD;  [facility];  Service: Gastroenterology;  Laterality: N/A;   HPI:  Pt is a 80 yo female with PMX of HTN, Sick Sinus Syndrome, dementia, PAF, CAD, CKD brought in by her daughter for worsening SOB this morning, denies any cough, no chest tightness, no fever, no sick contact;  family stated she eats minced/chopped foods at home and eats minimal amounts.  She prefers a straw when drinking per family.   Assessment / Plan / Recommendation Clinical Impression    Pt with overt s/s of aspiration including wet vocal quality (with larger amounts of liquids), audible swallow (could be suggestive of pharyngeal weakness) and underlying dementia related dysphagia probable given stage of dementia; pt's oral cavity was also very dry and oral care was provided prior to BSE with swallow initiated independently; pt exhibited multiple swallows initially with puree and initial sips of thin liquids, but this subsided with subsequent swallows; pt is moderately at risk for aspiration given multi-factorial medical issues, so po intake should be initiated with Dysphagia 2/thin with swallow precautions including no straw if able, small bites/sips and effortful swallow during intake.  ST will monitor for diet tolerance and downgrade prn if pt has changes with intake of recommended diet.     Aspiration Risk  Moderate    Diet Recommendation Dysphagia 2 (Fine chop);Thin liquid (small sips)   Liquid Administration via: Cup;Straw (use straw if needed, but not preferred) Medication Administration: Crushed with puree Supervision: Trained caregiver to feed patient Compensations: Slow rate;Small sips/bites;Effortful swallow Postural Changes and/or Swallow Maneuvers: Seated upright 90 degrees    Other  Recommendations     Follow Up Recommendations  24 hour supervision/assistance    Frequency and Duration min 2x/week  1 week     Pertinent Vitals/Pain Elevated BP intermittently    SLP Swallow Goals  See POC  Swallow Study Prior Functional Status   Dependent at home with family; dementia    General Date of Onset: 03/03/14 HPI: Pt is a 80 yo female with PMX of HTN, Sick Sinus Syndrome, dementia, PAF, CAD, CKD brought in by her daughter for worsening SOB this morning, denies any cough, no  chest tightness, no fever, no sick contact; family stated she eats minced/chopped foods at home and eats minimal amounts.  She prefers a straw when drinking per family. Type of Study: Bedside swallow evaluation Diet Prior to this Study: NPO Temperature Spikes Noted: No Respiratory Status: Room air Behavior/Cognition: Alert;Requires cueing;Cooperative Oral Cavity - Dentition: Missing dentition;Poor condition Self-Feeding Abilities: Needs assist;Needs set up Patient Positioning: Upright in bed Baseline Vocal Quality: Low vocal intensity;Hoarse (family stated she "screams a lot" d/t dementia) Volitional Cough: Congested Volitional Swallow: Able to elicit    Oral/Motor/Sensory Function Overall Oral Motor/Sensory Function: Appears within functional limits for tasks assessed   Ice Chips Ice chips: Not tested   Thin Liquid Thin Liquid: Impaired Presentation: Cup;Straw;Spoon Pharyngeal  Phase Impairments: Multiple swallows;Wet Vocal Quality (with larger amounts)    Nectar Thick Nectar Thick Liquid: Not tested   Honey Thick Honey Thick Liquid: Not tested   Puree Puree: Within functional limits Presentation: Spoon   Solid       Solid: Impaired Presentation: Spoon Oral Phase Impairments: Impaired mastication Pharyngeal Phase Impairments: Multiple swallows       [name],Yvette, M.S., CCC-SLP 03/04/2014,1:23 PM

## 2014-03-04 NOTE — Progress Notes (Signed)
Family Medicine Teaching Service Daily Progress Note Intern Pager: [phone]  Patient name: Yvette [name] record number: [id number] Date of birth: [birth year] Age: 80 y.o. Gender: female  Primary Care Provider: Christa See, MD Consultants: Palliative care, cardiology Code Status: Full on admission  Pt Overview and Major Events to Date:  12/22- Admitted with presumed CHF exacerbation, Bipap>>6LNC>>2LNC with diuresis 12/23 early AM - AF with RVR>>diltiazem drip>>NSR  Assessment and Plan: Yvette [name] is a 80 y.o. female presenting with dCHF exacerbation. PMH is significant for dementia, HTN, CKD, anxiety, paroxysmal atrial flutter, dCHF, and sick sinus syndrome.  #Acute Respiratory failure, possible due to Acute on Chronic diastolic CHF exacerbation - Echo 01/06/14 showing grade 1 diastolic CHF w/ EF of 92-42%. Admission exam without signs of gross fluid overload. Not convinced this is the only cause of respiratory failure especially with prior proBNPs elevated. Dry weight according to last cardiology note from 10/29 at 88 lbs. 94 lbs on ED scale >>96 lbs room scale 12/23. - Switched Pindolol (unsure why she is on this) to IV metoprolol; switch to toprol XL when able - Would consider Duplex/CTA for DVT/PE evaluation if continues to be tachycardic (Well's score of 3)  - Continue on ASA, hydralazine, Imdur. Not on ACE due to renal fxn most likely  - On Lasix IV 40 mg q 6 hrs and resp status improved until episode of RVR last night. Switch to PO when tolerates and f/u weight and BMETs. - Daily weights, I/O's, fluid restrictions, salt limitations  - Do not think repeat Echo at this point would be beneficial but could repeat if clinically changes  - D/c'ed megace as will cause fluid retention and raise DVT risk in this low mobility pt - BiPAP down to 2L O2 yesterday, up to 6L since RVR episode. Attempt to wean today.  - Palliative consult for GOC and end of life discussion    # Non anion gap Metabolic Acidosis - Well compensated according to ABG in ED. Elevated Lactic Acid, which could shift her to having combined AG/Non AG Met Acidosis.  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 Troponin mildly elevated to 0.08. Recheck EKG and trend troponins this morning. Consulted cards and appreciate recs.  #Leukocytosis/SIRS  - Tachycardic initially, meeting SIRS criteria - UA on admission concerning for infection. F/u culture and started rocephin after cx drawn. - Monitor daily, could just be stress reaction >>>resolved to 7.7 12/23 - If S/Sx of infxn, draw BCx, repeat CXR (would obtain lateral view to evaluate for RML PNA), CBC  #CKD Stage III - IV - Baseline creatinine around 1.6, bumped mildly to 1.8 12/23 due to either lasix or RVR. - Monitor closely with BMETs - Doubt current component of cardiorenal syndrome - Limit nephrotoxic agents   #Hyperkalemia - due to renal failure vs acidosis>>resolved to 3.9. - F/u daily BMET - Tx if  indicated   FEN/GI: SLIV, Heart Healthy Diet pending swallow study. Prophylaxis: Heparin SQ  Disposition: SDU, pending further w/u   Subjective:  Grandson in room, states patient has had LLE pain . No swelling or redness; improved with recent pain medication. No concerns from pt or grandson who translated for [place].  Objective: Temp:  [97.5 F (36.4 C)-98.2 F (36.8 C)] 97.7 F (36.5 C) (12/23 0729) Pulse  Rate:  [58-169] 72 (12/23 0800) Resp:  [13-34] 18 (12/23 0800) BP: (97-178)/(51-111) 135/52 mmHg (12/23 0800) SpO2:  [89 %-100 %] 95 % (12/23 0800) FiO2 (%):  [40 %] 40 % (12/22 1523) Weight:  [96 lb 1.9 oz (43.6 kg)-99 lb 3.3 oz (45 kg)] 96 lb 1.9 oz (43.6 kg) (12/23 0336) Physical Exam: General: NAD, lying in bed, thin woman Cardiovascular: RRR, no m/r/g, 2+ B radial pulse Respiratory: CTAB with normal effort Abdomen: S/NT/ND, cachectic Extremities: Thin, no LE edema; mild left leg generalized tenderness with no erythema, asymmetry, or induration  Laboratory:  Recent Labs Lab 03/03/14 0643 03/04/14 0327  WBC 11.4* 7.7  HGB 11.3* 12.1  HCT 36.8 38.9  PLT 277 304    Recent Labs Lab 03/03/14 0643 03/03/14 1705 03/03/14 1946 03/04/14 0327  NA 142 145 144 140  K 5.4* 4.0 3.9 4.2  CL 111 110 108 102  CO2 _0 BUN 43* 43* 42* 44*  CREATININE 1.65* 1.61* 1.64* 1.82*  CALCIUM 8.8 9.0 9.0 9.1  PROT 8.1  --   --  9.2*  BILITOT 1.2  --   --  0.7  ALKPHOS 56  --   --  67  ALT 11  --   --  15  AST 45*  --   --  27  GLUCOSE 141* 106* 105* 108*     Hilton Sinclair, MD 03/04/2014, 9:01 AM PGY-3, [place] Intern pager: [phone], text pages welcome

## 2014-03-04 NOTE — Progress Notes (Signed)
@  0253 pt converted from NSR to Afib RVR sustaining 130s-170s (confirmed via STAT 12 lead). BP stable (130s/70s), pt whimpering, RR increased to 30s and labored. SpO2 in low 90s on 2L [place] but increased to 6L due to assumed decreased Cardiac Output and perfusion. MD paged and came to bedside. Orders for cardizem gtt received among others. Gtt started at 0325 and currently being titrated. Current condition explained to family (son-in-law and grandson) at bedside. Code status (full) reaffirmed by family. Will continue to monitor and assess.

## 2014-03-04 NOTE — Discharge Summary (Signed)
[facility] Discharge Summary  Patient name: Yvette [name] record number: [id number] Date of birth: 18-Nov-1916 Age: 80 y.o. Gender: female Date of Admission: 03/03/2014  Date of Discharge: 03/06/14 Admitting Physician: Andrena Mews, MD  Primary Care Provider: Christa See, MD Consultants: cardiology  Indication for Hospitalization: Acute respiratory failure  Discharge Diagnoses/Problem List:  Diastolic CHF exacerbation UTI Atrial fibrillation with RVR Leukocytosis AoCKD  Disposition: home  Discharge Condition: stable  Discharge Exam: See Progress note for day of discharge  Brief Hospital Course:  Thi sis a 80 y.o. female presenting with diastolic CHF exacerbation. PMH is significant for dementia, HTN, CKD, anxiety, paroxysmal atrial flutter, diastolic CHF, and sick sinus syndrome.  Acute respiratory failure Thought on admission to be due to acute on chronic dCHF exacerbation. Echo 10/27 showed grade 1 diastolic CHF with EF 65-68%, admission exam without signs of fluid overload, and prior proBNPs had been elevated making BNP 1400s somewhat unreliable. Diuresed initially with lasix 82m IV Q6 hours and on day 2 switched to lasix 4110mPO daily given patient began to look dry. She did transition from BiPAP to RA by 2nd hospital day. Continued aspirin, hydralazine, and imdur (most likely renal function as reason to not be on ACEi). Discontinued megace as this can contribute to fluid retention and raise DVT risk in low mobility patient. Palliative care considered for goals of care discussion in patient with multiple admissions recently for respiratory failure; however patient and family refused. Continue to discuss. Lasix held on discharge due to AKI. Will f/u with PCP to recheck Creatine and restart diuresis when needed.   Atrial fibrillation with RVR H/o Atrial flultter with sick sinus syndrome, but was not a good anticoagulant candidate due to  h/o falls. On first night, pt had episode of AF with RVR and was placed on diltiazem drip which rapidly converted her to NSR. Troponin was mildly elevated to 0.08 and trended upwards to 0.15. Cardiology consulted and thought EKG during NSR appeared unchanged from prior hospitalization, and that patient was a poor surgical candidate due to age and comorbidities, and recommended palliative care consult (see above). Transitioned to PO diltiazem.   Leukocytosis Patient met SIRS criteria on admission with tachycardia. UA was concerning for infection so culture was drawn and patient was started on rocephin. WBC normalized the following day. Transition to Keflex on discharge.   CKD stage III-IV Patient's baseline creatinine was ~1.6 and was 1.8 on 12/23 likely due to RVR plus lasix dosing. Creatine increased to ~ 2.6 and stabilized. Lasix was held on discharge.   Issues for Follow Up:  - Need to re-establish dry weight outpatient. Discharge weight 92 lbs.  - Recheck Cr; Consider restarting Diuretics pending Cr and fluid status.  - Completed Keflex ? End date 03/12/14 - Started Diltiazem ?   Significant Procedures: none  Significant Labs and Imaging:   Recent Labs Lab 03/04/14 0327 03/05/14 0249 03/06/14 0557  WBC 7.7 7.4 7.6  HGB 12.1 11.8* 12.1  HCT 38.9 37.3 39.3  PLT 304 287 304    Recent Labs Lab 03/03/14 0643  03/03/14 1946 03/04/14 0327 03/04/14 1420 03/05/14 0249 03/06/14 0557  NA 142  < > 144 140 143 145 146*  K 5.4*  < > 3.9 4.2 4.3 4.2 4.4  CL 111  < > 108 102 106 105 107  CO2 22  < > 26 25 24 27 27   GLUCOSE 141*  < > 105* 108* 122* 109* 151*  BUN 43*  < >  42* 44* 52* 66* 70*  CREATININE 1.65*  < > 1.64* 1.82* 2.09* 2.57* 2.58*  CALCIUM 8.8  < > 9.0 9.1 8.8 8.8 9.0  ALKPHOS 56  --   --  67  --   --   --   AST 45*  --   --  27  --   --   --   ALT 11  --   --  15  --   --   --   ALBUMIN 3.5  --   --  3.8  --   --   --   < > = values in this interval not  displayed.    Recent Labs Lab 03/03/14 [phone] 03/04/14 0327 03/04/14 0926 03/04/14 1420 03/04/14 2010  TROPONINI 0.03 0.08* 0.14* 0.15* 0.14*   Results/Tests Pending at Time of Discharge: none  Discharge Medications:    Medication List    STOP taking these medications        hydrALAZINE 50 MG tablet  Commonly known as:  APRESOLINE     levofloxacin 500 MG tablet  Commonly known as:  LEVAQUIN     megestrol 40 MG tablet  Commonly known as:  MEGACE     pindolol 5 MG tablet  Commonly known as:  VISKEN      TAKE these medications        aspirin EC 81 MG tablet  Take 81 mg by mouth daily.     cephALEXin 250 MG/5ML suspension  Commonly known as:  KEFLEX  Take 10 mLs (500 mg total) by mouth 3 (three) times daily.     diclofenac sodium 1 % Gel  Commonly known as:  VOLTAREN  Apply 2 g topically 4 (four) times daily.     diltiazem 360 MG 24 hr capsule  Commonly known as:  CARDIZEM CD  Take 1 capsule (360 mg total) by mouth daily.  Start taking on:  03/07/2014     isosorbide mononitrate 30 MG 24 hr tablet  Commonly known as:  IMDUR  Take 15 mg by mouth daily.     Melatonin 5 MG Tabs  Take 10 mg by mouth at bedtime.     metoprolol tartrate 12.5 mg Tabs tablet  Commonly known as:  LOPRESSOR  Take 0.5 tablets (12.5 mg total) by mouth 2 (two) times daily. (BETA BLOCKER); Crush and administer in puree     multivitamin with minerals Tabs tablet  Take 1 tablet by mouth daily.     nitroGLYCERIN 0.4 MG SL tablet  Commonly known as:  NITROSTAT  Place 1 tablet (0.4 mg total) under the tongue every 5 (five) minutes as needed for chest pain.     pantoprazole 40 MG tablet  Commonly known as:  PROTONIX  Take 40 mg by mouth daily.     silver sulfADIAZINE 1 % cream  Commonly known as:  SILVADENE  Apply 1 application topically daily. To affected skin areas.     URIBEL 118 MG Caps  Take 1 capsule by mouth 4 (four) times daily.      ASK your doctor about these  medications        torsemide 20 MG tablet  Commonly known as:  DEMADEX  Take 4 times a week as directed        Discharge Instructions: Please refer to Patient Instructions section of EMR for full details.  Patient was counseled important signs and symptoms that should prompt return to medical care, changes in medications, dietary instructions, activity restrictions,  and follow up appointments.   Follow-Up Appointments: Follow-up Information    Follow up with Chrisandra Netters, MD On 03/09/2014.   Specialty:  Family Medicine   Why:  Appointment 9:30am   Contact information:   [place] [id number] [place], MD 03/06/2014, 2:22 PM PGY-2, [place]

## 2014-03-04 NOTE — Consult Note (Signed)
Reason for Consult: AFIB with RVR/dCHF  Requesting Physician: Dr. Lum Babe  HPI: The patient is a 80 year old thin frail and chronically ill-appearing widowed [place] female who we follow in the office for sick sinus syndrome, proximal atrial fibrillation, and diastolic heart failure. She was recently seen in our office approximately one month ago by Huey Bienenstock PA-C and was close to her dry weight. She was admitted yesterday with increasing shortness of breath. Her BNP is mildly elevated. She's had a recent echo that shows preserved LV function with diastolic dysfunction. She does have a history of PAF/sick sinus syndrome not a candidate for Coumadin anticoagulation. Her other problems include chronic renal insufficiency, and hypertension. She also has some degree of dementia. She lives with family members. She appears to be a full code though palliative care consult has been recommended in the past. She was in A. Fib apparently last night and converted to sinus rhythm. Her EKG shows left ventricular hypertrophy with repolarization changes unchanged from prior EKG performed in October. Problem List: Patient Active Problem List   Diagnosis Date Noted  . Respiratory failure 03/03/2014  . Acute respiratory failure with hypoxemia 03/03/2014  . Urinary tract infectious disease   . Tear of anal skin 01/12/2014  . Elevated troponin- demand ischemia 01/07/2014  . Flash pulmonary edema 01/05/2014  . Acute respiratory failure 01/05/2014  . Healthcare-associated pneumonia 01/05/2014  . Cataracts, bilateral, very poor vision 12/25/2013  . Poor vision 12/25/2013  . Impaired mobility and ADLs 12/25/2013  . Hearing loss 12/25/2013  . Cerumen impaction 12/25/2013  . Dementia 12/25/2013  . Circadian rhythm disorder 12/25/2013  . Chronic indwelling Foley catheter 11/10/2013  . Unstable angina, history of 10/04/2012  . History of ST elevation myocardial infarction (STEMI) 09/24/2012  . Vertebral  fracture 07/28/2012  . CAD- STEMI 2012 07/25/2012  . Urinary retention 07/25/2012  . Acute on chronic diastolic CHF (congestive heart failure) 10/05/2011  . HTN (hypertension) 10/05/2011  . Chronic kidney disease (CKD), stage IV (severe) 04/26/2011  . Anxiety 05/05/2010  . Paroxysmal atrial flutter   . Chronic diastolic heart failure   . Sick sinus syndrome   . BRADYCARDIA-TACHYCARDIA SYNDROME 05/02/2010    PMHx:  Past Medical History  Diagnosis Date  . Paroxysmal atrial flutter [facility] admission 2/5-04/20/2010    High fall risk-not a Coumadin candidate  . Hypertension   . Chronic diastolic heart failure     a. Echo February 2012: EF 55%; moderate LVH; mild AI; mild MR; PASP 39  . Sick sinus syndrome     a. Limits use of beta blocker-pindolol tolerated  . CAD (coronary artery disease) PRESUMED    a. 04/2010 NSTEMI->med rx;  b. 09/2012 inf stemi->med rx.  . Arthritis   . Anxiety   . Hydronephrosis     Chronic  . Chronic back pain   . Stroke   . Herpes zoster 03/01/2012  . Myocardial infarction   . CHF (congestive heart failure)   . Chronic indwelling Foley catheter 11/10/2013  . Cataracts, bilateral, very poor vision 12/25/2013  . UTI (urinary tract infection) 10/04/2012  . Unstable angina 10/04/2012  . SOB (shortness of breath) 10/05/2011  . Respiratory distress 11/10/2013  . Protein-calorie malnutrition, severe 11/11/2013  . Physical deconditioning 04/26/2011  . Loss of weight 08/29/2012  . Fatigue 10/04/2011  . Encounter for palliative care 07/24/2012  . Acute on chronic diastolic CHF (congestive heart failure), NYHA class 1 10/05/2011  . History of ST elevation myocardial infarction (STEMI) 09/24/2012  Past Surgical History  Procedure Laterality Date  . Esophagogastroduodenoscopy  04/19/2011    Procedure: ESOPHAGOGASTRODUODENOSCOPY (EGD);  Surgeon: Erick BlinksJay Pyrtle, MD;  [facility];  Service: Gastroenterology;  Laterality: N/A;    FAMHx: Family History  Problem Relation Age  of Onset  . CAD Neg Hx     SOCHx:  reports that she has never smoked. She has never used smokeless tobacco. She reports that she does not drink alcohol or use illicit drugs.  ALLERGIES: No Known Allergies  ROS: Pertinent items are noted in HPI.  HOME MEDICATIONS: Prescriptions prior to admission  Medication Sig Dispense Refill Last Dose  . aspirin EC 81 MG tablet Take 81 mg by mouth daily.   03/02/2014 at Unknown time  . diclofenac sodium (VOLTAREN) 1 % GEL Apply 2 g topically 4 (four) times daily. 1 Tube 5 03/02/2014 at Unknown time  . hydrALAZINE (APRESOLINE) 50 MG tablet Take 50 mg by mouth 4 (four) times daily.   03/02/2014 at Unknown time  . isosorbide mononitrate (IMDUR) 30 MG 24 hr tablet Take 15 mg by mouth daily.   03/02/2014 at Unknown time  . megestrol (MEGACE) 40 MG tablet TAKE 4 TABLETS (160 MG TOTAL) BY MOUTH DAILY. 120 tablet 3 03/02/2014 at Unknown time  . Melatonin 5 MG TABS Take 10 mg by mouth at bedtime.    03/02/2014 at Unknown time  . Meth-Hyo-M Bl-Na Phos-Ph Sal (URIBEL) 118 MG CAPS Take 1 capsule by mouth 4 (four) times daily.   03/02/2014 at Unknown time  . Multiple Vitamin (MULTIVITAMIN WITH MINERALS) TABS tablet Take 1 tablet by mouth daily.   03/02/2014 at Unknown time  . nitroGLYCERIN (NITROSTAT) 0.4 MG SL tablet Place 1 tablet (0.4 mg total) under the tongue every 5 (five) minutes as needed for chest pain. 25 tablet 3 unknown  . pantoprazole (PROTONIX) 40 MG tablet Take 40 mg by mouth daily.   03/02/2014 at Unknown time  . pindolol (VISKEN) 5 MG tablet Take 2.5-5 mg by mouth See admin instructions. Take 1 tablet in the morning and 0.5 tablet in the evening.   03/02/2014 at Unknown time  . silver sulfADIAZINE (SILVADENE) 1 % cream Apply 1 application topically daily. To affected skin areas. 50 g 1 Past Month at Unknown time  . torsemide (DEMADEX) 20 MG tablet Take 4 times a week as directed (Patient taking differently: Take 20 mg by mouth See admin  instructions. Take 1 tablet on Tuesday, Wednesday, Friday and Saturday) 48 tablet 3 Past Week at Unknown time  . levofloxacin (LEVAQUIN) 500 MG tablet Take 1 tablet (500 mg total) by mouth every other day. (Patient not taking: Reported on 03/03/2014) 4 tablet 0 Not Taking at Unknown time    HOSPITAL MEDICATIONS: I have reviewed the patient's current medications.  VITALS: Blood pressure 135/52, pulse 72, temperature 97.7 F (36.5 C), temperature source Axillary, resp. rate 18, height 4\' 6"  (1.372 m), weight 96 lb 1.9 oz (43.6 kg), SpO2 95 %.  INPUT/OUTPUT I/O last 3 completed shifts: In: 7178 [I.V.:28; IV Piggyback:50] Out: 3050 [Urine:3050] Total I/O In: 48.3 [I.V.:48.3] Out: 200 [Urine:200]    PHYSICAL EXAM: General appearance: alert and no distress Neck: no adenopathy, no carotid bruit, no JVD, supple, symmetrical, trachea midline and thyroid not enlarged, symmetric, no tenderness/mass/nodules Lungs: clear to auscultation bilaterally Heart: regular rate and rhythm, S1, S2 normal, no murmur, click, rub or gallop Extremities: extremities normal, atraumatic, no cyanosis or edema  LABS:  BMP  Recent Labs  03/03/14 1705  03/03/14 1946 03/04/14 0327  NA 145 144 140  K 4.0 3.9 4.2  CL 110 108 102  CO2 23 26 25   GLUCOSE 106* 105* 108*  BUN 43* 42* 44*  CREATININE 1.61* 1.64* 1.82*  CALCIUM 9.0 9.0 9.1  GFRNONAA 26* 25* 22*  GFRAA 30* 30* 26*    CBC  Recent Labs Lab 03/04/14 0327  WBC 7.7  RBC 4.16  HGB 12.1  HCT 38.9  PLT 304  MCV 93.5    HEMOGLOBIN A1C Lab Results  Component Value Date   HGBA1C 6.1* 09/24/2012   MPG 128* 09/24/2012    Cardiac Panel (last 3 results)  Recent Labs  01/05/14 2210 03/03/14 0643 03/04/14 0327  TROPONINI 1.62* 0.03 0.08*    BNP (last 3 results)  Recent Labs  11/10/13 1200 01/05/14 0400 01/08/14 0610  PROBNP 7907.0* 9191.0* 8164.0*    TSH  Recent Labs  11/10/13 0641  TSH 1.690    CHOLESTEROL No results  for input(s): CHOL in the last 8760 hours.  Hepatic Function Panel  Recent Labs  01/05/14 0421 03/03/14 0643 03/04/14 0327  PROT 9.0* 8.1 9.2*  ALBUMIN 3.2* 3.5 3.8  AST 32 45* 27  ALT 18 11 15   ALKPHOS 64 56 67  BILITOT 0.4 1.2 0.7    IMAGING: Dg Chest Portable 1 View  03/03/2014   CLINICAL DATA:  Difficulty breathing  EXAM: PORTABLE CHEST - 1 VIEW  COMPARISON:  January 21, 2014  FINDINGS: There is cardiomegaly with mild pulmonary venous hypertension and interstitial edema. There is no airspace consolidation. There is atherosclerotic change in the aorta. No adenopathy. There are surgical clips in left axilla. Bones are osteoporotic.  IMPRESSION: Evidence of a degree of congestive heart failure. No airspace consolidation.   Electronically Signed   By: Bretta BangWilliam  Woodruff M.D.   On: 03/03/2014 07:01   Tele- NSR  IMPRESSION: 1. Paroxysmal atrial fibrillation-currently in normal sinus rhythm 2. Abnormal EKG-LVH with strain on current EKG unchanged from prior EKG when in sinus rhythm in October 3. Hypertension 4. Chronic renal insufficiency 5. Diastolic heart failure 6. Dementia   RECOMMENDATION: 1. The patient has converted to sinus rhythm. She is not a candidate for systemic anticoagulation. She appears dry to me. She does have a history of coronary artery disease presumably and has had medical treatment of inferior STEMI and non-STEMI in the past. The palliative  care consult was recommended during her past admission but this was not pursued at at the request of the family. At this point I've no further recommendations. The patient's serum creatinine has risen slightly to 1.8 from a baseline of 1.6 and she has received diuretics. She is not a candidate for any invasive procedure. I strongly recommend that a discussion was had with the family with regards to palliative. Care consult and her CODE STATUS given her age and comorbidities. We will follow as needed.  Time Spent Directly  with Patient: 30 minutes  Tequia Wolman J 03/04/2014, 9:49 AM

## 2014-03-04 NOTE — Progress Notes (Signed)
I have spoken with Ms. Katti along with a grandson in the room who speaks English and his mother (does not speak [place]). He confirms that daughter, Audley HoseHong, is the Management consultantdecision maker. I have left a message for ChinaHong to call me back so we can talk or set a time to talk. They have historically refused palliative care consult but I will attempt to engage. I will await call back and will continue to follow up.  Yong ChannelAlicia Sharon Rubis, NP Palliative Medicine Team Pager # [phone] (M-F 8a-5p) Team Phone # [phone] (Nights/Weekends)

## 2014-03-04 NOTE — Progress Notes (Signed)
Notified md on call of 12 lead ekg results

## 2014-03-05 LAB — CBC WITH DIFFERENTIAL/PLATELET
Basophils Absolute: 0 10*3/uL (ref 0.0–0.1)
Basophils Relative: 0 % (ref 0–1)
EOS ABS: 0.2 10*3/uL (ref 0.0–0.7)
Eosinophils Relative: 2 % (ref 0–5)
HEMATOCRIT: 37.3 % (ref 36.0–46.0)
Hemoglobin: 11.8 g/dL — ABNORMAL LOW (ref 12.0–15.0)
Lymphocytes Relative: 21 % (ref 12–46)
Lymphs Abs: 1.6 10*3/uL (ref 0.7–4.0)
MCH: 29.4 pg (ref 26.0–34.0)
MCHC: 31.6 g/dL (ref 30.0–36.0)
MCV: 92.8 fL (ref 78.0–100.0)
Monocytes Absolute: 0.8 10*3/uL (ref 0.1–1.0)
Monocytes Relative: 10 % (ref 3–12)
Neutro Abs: 4.9 10*3/uL (ref 1.7–7.7)
Neutrophils Relative %: 67 % (ref 43–77)
PLATELETS: 287 10*3/uL (ref 150–400)
RBC: 4.02 MIL/uL (ref 3.87–5.11)
RDW: 13.6 % (ref 11.5–15.5)
WBC: 7.4 10*3/uL (ref 4.0–10.5)

## 2014-03-05 LAB — BASIC METABOLIC PANEL
Anion gap: 13 (ref 5–15)
BUN: 66 mg/dL — AB (ref 6–23)
CALCIUM: 8.8 mg/dL (ref 8.4–10.5)
CO2: 27 mmol/L (ref 19–32)
Chloride: 105 mEq/L (ref 96–112)
Creatinine, Ser: 2.57 mg/dL — ABNORMAL HIGH (ref 0.50–1.10)
GFR calc Af Amer: 17 mL/min — ABNORMAL LOW (ref >90)
GFR, EST NON AFRICAN AMERICAN: 15 mL/min — AB (ref >90)
GLUCOSE: 109 mg/dL — AB (ref 70–99)
POTASSIUM: 4.2 mmol/L (ref 3.5–5.1)
Sodium: 145 mmol/L (ref 135–145)

## 2014-03-05 LAB — GLUCOSE, CAPILLARY: Glucose-Capillary: 119 mg/dL — ABNORMAL HIGH (ref 70–99)

## 2014-03-05 LAB — URINE CULTURE: Colony Count: >=100000

## 2014-03-05 MED ORDER — POLYETHYLENE GLYCOL 3350 17 G PO PACK
17.0000 g | PACK | Freq: Once | ORAL | Status: AC
  Administered 2014-03-05: 17 g via ORAL
  Filled 2014-03-05: qty 1

## 2014-03-05 NOTE — Progress Notes (Signed)
Family Medicine Teaching Service Daily Progress Note Intern Pager: [phone]  Patient name: Yvette [name] record number: [id number] Date of birth: 02/05/1917 Age: 80 y.o. Gender: female  Primary Care Provider: Christa See, MD Consultants: Palliative care, cardiology Code Status: Full on admission  Pt Overview and Major Events to Date:  12/22- Admitted with presumed CHF exacerbation, Bipap>>6LNC>>2LNC with diuresis 12/23 early AM - AF with RVR>>diltiazem drip>>NSR  Assessment and Plan: Yvette [name] is a 80 y.o. female presenting with dCHF exacerbation. PMH is significant for dementia, HTN, CKD, anxiety, paroxysmal atrial flutter, dCHF, and sick sinus syndrome.  #Acute Respiratory failure, possible due to Acute on Chronic diastolic CHF exacerbation - Echo 01/06/14 showing grade 1 diastolic CHF w/ EF of 24-09%. Admission exam without signs of gross fluid overload. Not convinced this is the only cause of respiratory failure especially with prior proBNPs elevated. Dry weight according to last cardiology note from 10/29 at 88 lbs. 94 lbs on ED scale >>96 lbs room scale 12/23>>98 lbs 12/24. - continue metoprolol - Continue on ASA, hydralazine, Imdur.  - consider stopping lasix as Cr continues to increase - Daily weights, I/O's, fluid restrictions, salt limitations  - Do not think repeat Echo at this point would be beneficial but could repeat if clinically changes  - D/c'ed megace as will cause fluid retention and raise DVT risk in this low mobility pt - now on RA - Palliative consult for GOC and end of life discussion - daughter unable to come until 5 pm, palliative will not be able to see as their team is not here over the weekend - [facility] if patient to be discharged over the weekend  # Non anion gap Metabolic Acidosis - Well compensated according to ABG in ED. Elevated Lactic Acid, which could shift her to having  combined AG/Non AG Met Acidosis.  - Daily BMETs  - Repeat ABG if indicated   #Hx of STEMI  - Continue with ASA, BB - EKG as needed if CP - troponins elevated to 0.08>>0.14>>0.15>>0.14 - cardiology consulted, no intervention or anticoagulation at this time, recommend palliative  #HTN  - Continue current medications  #Hx of A fib/Sick Sinus Syndrome  - High fall risk, no anticoag due to this reason - Episode of AF with RVR during this hospitalization - initially on diltiazem drip, now on PO diltiazem and in NSR  #Leukocytosis/SIRS - resolved - Tachycardic initially, meeting SIRS criteria - UA on admission concerning for infection. UCx with e coli. Will continue CTX until sensitivities result. - If S/Sx of infxn, draw BCx, repeat CXR (would obtain lateral view to evaluate for RML PNA), CBC  #CKD Stage III - IV - Baseline creatinine around 1.6, continued increase to 2.57 - likely related to diuresis and dehydration - Monitor closely with BMETs - Doubt current component of cardiorenal syndrome - Limit nephrotoxic agents  - hold lasix today and encourage PO intake  FEN/GI: SLIV, Heart Healthy Diet Prophylaxis: Heparin SQ  Disposition: transfer to floor  Subjective:  No complaints this morning. No concerns from pt or grandson who translated for [place].  Objective: Temp:  [97.6 F (36.4 C)-98.5 F (36.9 C)] 98.1 F (36.7 C) (12/24 0800) Pulse Rate:  [66-84] 80 (12/24 0800) Resp:  [16-29] 29 (12/24 0800) BP: (97-174)/(42-96) 138/96 mmHg (12/24 0800) SpO2:  [90 %-100 %] 100 % (12/24 0800) Weight:  [98 lb 5.2 oz (44.6 kg)] 98 lb 5.2 oz (44.6 kg) (12/24 0500) Physical Exam: General:  NAD, lying in bed, thin woman Cardiovascular: RRR, no m/r/g Respiratory: CTAB with normal effort Abdomen: S/NT/ND, cachectic Extremities: Thin, no LE edema  Laboratory:  Recent Labs Lab 03/03/14 0643 03/04/14 0327 03/05/14 0249  WBC 11.4* 7.7 7.4  HGB 11.3* 12.1 11.8*  HCT 36.8 38.9 37.3   PLT 277 304 287    Recent Labs Lab 03/03/14 0643  03/04/14 0327 03/04/14 1420 03/05/14 0249  NA 142  < > 140 143 145  K 5.4*  < > 4.2 4.3 4.2  CL 111  < > 102 106 105  CO2 22  < > 25 24 27   BUN 43*  < > 44* 52* 66*  CREATININE 1.65*  < > 1.82* 2.09* 2.57*  CALCIUM 8.8  < > 9.1 8.8 8.8  PROT 8.1  --  9.2*  --   --   BILITOT 1.2  --  0.7  --   --   ALKPHOS 56  --  67  --   --   ALT 11  --  15  --   --   AST 45*  --  27  --   --   GLUCOSE 141*  < > 108* 122* 109*  < > = values in this interval not displayed.   Leone Haven, MD 03/05/2014, 9:59 AM PGY-3, [place] Intern pager: [phone], text pages welcome

## 2014-03-05 NOTE — Progress Notes (Signed)
Report called pt transferring to 5W13 via bed with belongings, family at bedside aware of new room number.

## 2014-03-05 NOTE — Progress Notes (Addendum)
Unfortunately I have received a message back from daughter, ChinaHong, who is the decision-maker for her mother but she is working until 5 pm again today. We do not have anyone available until Monday from palliative care to see her. I am happy to engage them when I return Monday. If she discharges and family is open to consult - palliative care services may be [facility] for [facility] visit - [facility] may help set this up IF family agrees to meet with them. I have spoken and updated attending service. Sorry for the inconvenience.  Yong ChannelAlicia Ellena Kamen, NP Palliative Medicine Team Pager # [phone] (M-F 8a-5p) Team Phone # [phone] (Nights/Weekends)

## 2014-03-06 DIAGNOSIS — J9601 Acute respiratory failure with hypoxia: Secondary | ICD-10-CM

## 2014-03-06 DIAGNOSIS — N289 Disorder of kidney and ureter, unspecified: Secondary | ICD-10-CM

## 2014-03-06 DIAGNOSIS — N179 Acute kidney failure, unspecified: Secondary | ICD-10-CM

## 2014-03-06 DIAGNOSIS — N189 Chronic kidney disease, unspecified: Secondary | ICD-10-CM | POA: Insufficient documentation

## 2014-03-06 LAB — BASIC METABOLIC PANEL
Anion gap: 12 (ref 5–15)
BUN: 70 mg/dL — ABNORMAL HIGH (ref 6–23)
CALCIUM: 9 mg/dL (ref 8.4–10.5)
CO2: 27 mmol/L (ref 19–32)
Chloride: 107 mEq/L (ref 96–112)
Creatinine, Ser: 2.58 mg/dL — ABNORMAL HIGH (ref 0.50–1.10)
GFR, EST AFRICAN AMERICAN: 17 mL/min — AB (ref >90)
GFR, EST NON AFRICAN AMERICAN: 15 mL/min — AB (ref >90)
GLUCOSE: 151 mg/dL — AB (ref 70–99)
POTASSIUM: 4.4 mmol/L (ref 3.5–5.1)
SODIUM: 146 mmol/L — AB (ref 135–145)

## 2014-03-06 LAB — CBC
HCT: 39.3 % (ref 36.0–46.0)
Hemoglobin: 12.1 g/dL (ref 12.0–15.0)
MCH: 28.9 pg (ref 26.0–34.0)
MCHC: 30.8 g/dL (ref 30.0–36.0)
MCV: 93.8 fL (ref 78.0–100.0)
PLATELETS: 304 10*3/uL (ref 150–400)
RBC: 4.19 MIL/uL (ref 3.87–5.11)
RDW: 13.6 % (ref 11.5–15.5)
WBC: 7.6 10*3/uL (ref 4.0–10.5)

## 2014-03-06 MED ORDER — DILTIAZEM HCL ER COATED BEADS 360 MG PO CP24: 360.0000 mg | ORAL_CAPSULE | Freq: Every day | ORAL | Status: AC

## 2014-03-06 MED ORDER — METOPROLOL TARTRATE 12.5 MG HALF TABLET: 12.5000 mg | ORAL_TABLET | Freq: Two times a day (BID) | ORAL | Status: AC

## 2014-03-06 MED ORDER — CEPHALEXIN 250 MG/5ML PO SUSR
500.0000 mg | Freq: Two times a day (BID) | ORAL | Status: DC
  Administered 2014-03-06: 500 mg via ORAL
  Filled 2014-03-06 (×2): qty 10

## 2014-03-06 MED ORDER — DILTIAZEM HCL 90 MG PO TABS: 90.0000 mg | ORAL_TABLET | Freq: Four times a day (QID) | ORAL | Status: DC

## 2014-03-06 MED ORDER — CEPHALEXIN 250 MG/5ML PO SUSR: 500.0000 mg | Freq: Three times a day (TID) | ORAL | Status: AC

## 2014-03-06 MED ORDER — CEPHALEXIN 250 MG/5ML PO SUSR: 500.0000 mg | Freq: Two times a day (BID) | ORAL | Status: DC

## 2014-03-06 NOTE — Progress Notes (Signed)
Family Medicine Teaching Service Daily Progress Note Intern Pager: [phone]  Patient name: Yvette [name] Medical record number: [id number] Date of birth: 07/30/1916 Age: 80 y.o. Gender: female  Primary Care Provider: Maryjean KaStreet, Christopher, MD Consultants: Palliative care, cardiology Code Status: Full on admission  Pt Overview and Major Events to Date:  12/22- Admitted with presumed CHF exacerbation, Bipap>>6LNC>>2LNC with diuresis 12/23 early AM - AF with RVR>>diltiazem drip>>NSR 12/24 transferred out of step down, Cr 2.57 12/25 Cr 2.58  Assessment and Plan: [name] is a 80 y.o. female presenting with dCHF exacerbation. PMH is significant for dementia, HTN, CKD, anxiety, paroxysmal atrial flutter, dCHF, and sick sinus syndrome.  #Acute Respiratory failure, possible due to Acute on Chronic diastolic CHF exacerbation - Echo 01/06/14 showing grade 1 diastolic CHF w/ EF of 60-65%. Admission exam without signs of gross fluid overload. Not convinced this is the only cause of respiratory failure especially with prior proBNPs elevated. Dry weight according to last cardiology note from 10/29 at 88 lbs. 94 lbs on ED scale >>96 lbs room scale 12/23>>98 lbs 12/24>>92lbs 12/24 pm. - continue metoprolol - Continue on ASA, hydralazine, Imdur.  - continue to hold lasix - Daily weights, I/O's, fluid restriction, salt limitations  - D/c'ed megace as will cause fluid retention and raise DVT risk in this low mobility pt - Palliative consult for GOC and end of life discussion - will need outpatient palliative care consult - Dr Leveda AnnaHensel to discuss with family today  #Hx of STEMI  - Continue with ASA, BB - EKG as needed if CP - troponins elevated to 0.08>>0.14>>0.15>>0.14 - cardiology consulted, no intervention or anticoagulation at this time, recommend palliative  #HTN  - Continue current medications  #Hx of A fib/Sick Sinus Syndrome  - High fall risk, no anticoag due to this reason - Episode of  AF with RVR during this hospitalization - initially on diltiazem drip, now on PO diltiazem and in NSR  #UTI  - UA on admission concerning for infection. UCx with e coli pansensitive - will transition to keflex to complete 7 day course  #CKD Stage III - IV - Baseline creatinine around 1.6, stable today at 2.58 - potentially related to diuresis and dehydration given that this leveled off with stopping lasix. I am hopeful this will continue to improve now that the lasix is off.  - check BMET in am to trend Cr - Limit nephrotoxic agents  - continue to hold lasix and encourage PO intake  #Delirium: family reported confusion in the patient last night. Likely delirium related to her age and being in the hospital. -will continue to monitor  FEN/GI: SLIV, Heart Healthy Diet - encourage PO intake Prophylaxis: Heparin SQ  Disposition: discharge pending improvement in renal function - possible discharge tomorrow if creatinine trending down  Subjective:  No complaints this morning from the patient. Family reports she was confused 2 times last night and that this usually happens when she is brought in to the hospital.  Objective: Temp:  [97.8 F (36.6 C)-98.9 F (37.2 C)] 98.9 F (37.2 C) (12/25 0552) Pulse Rate:  [80-85] 80 (12/25 0552) Resp:  [16-21] 18 (12/25 0552) BP: (119-148)/(57-63) 137/58 mmHg (12/25 0552) SpO2:  [90 %-97 %] 94 % (12/25 0552) Weight:  [92 lb 6 oz (41.9 kg)] 92 lb 6 oz (41.9 kg) (12/24 1715) Physical Exam: General: NAD, lying in bed, thin woman Cardiovascular: RRR, no m/r/g Respiratory: CTAB with normal effort Abdomen: S/NT/ND, cachectic Extremities: Thin, no LE edema  Laboratory:  Recent Labs Lab 03/04/14 0327 03/05/14 0249 03/06/14 0557  WBC 7.7 7.4 7.6  HGB 12.1 11.8* 12.1  HCT 38.9 37.3 39.3  PLT 304 287 304    Recent Labs Lab 03/03/14 0643  03/04/14 0327 03/04/14 1420 03/05/14 0249 03/06/14 0557  NA 142  < > 140 143 145 146*  K 5.4*  < > 4.2  4.3 4.2 4.4  CL 111  < > 102 106 105 107  CO2 22  < > 25 24 27 27   BUN 43*  < > 44* 52* 66* 70*  CREATININE 1.65*  < > 1.82* 2.09* 2.57* 2.58*  CALCIUM 8.8  < > 9.1 8.8 8.8 9.0  PROT 8.1  --  9.2*  --   --   --   BILITOT 1.2  --  0.7  --   --   --   ALKPHOS 56  --  67  --   --   --   ALT 11  --  15  --   --   --   AST 45*  --  27  --   --   --   GLUCOSE 141*  < > 108* 122* 109* 151*  < > = values in this interval not displayed.   Glori LuisEric G Markiah Janeway, MD 03/06/2014, 9:33 AM PGY-3, [place] [facility] FPTS Intern pager: [phone], text pages welcome

## 2014-03-06 NOTE — Discharge Instructions (Signed)
You were admitted with shortness of breath related to heart failure. This improved with medication causing you to urinate. While you were hospitalized your renal function became worse. This was stable at the time of your discharge. You were also treated for a UTI with antibiotics which you need to continue to take three times a day for the next 6 days. You also were started on a medication to help with you fast heart beat. You have an appointment at the family medicine clinic on Monday    Kh th? (Shortness of Breath) Kh th? c ngh?a l qu v? g?p kh kh?n khi th?. Kh th? c?n ???c khm ngay l?p t?c. CH?M [place] T?I NH   Khng ht thu?c.  Young Berryrnh cc ha ch?t ho?c nh?ng th? xung quanh (h?i s?n, b?i) c th? ?nh h??ng ??n kh? n?ng th? c?a qu v?.  Ngh? ng?i theo nhu c?u. T? t? b?t ??u cc sinh ho?t bnh th??ng c?a qu v?.  Ch? s? d?ng thu?c theo ch? d?n c?a bc s?.  ??n t?t c? cc cu?c h?n khm theo ch? d?n. YU C?U TR? GIP NGAY L?P T?C N?U:   Tnh tr?ng kh th? tr? nn t? h?n.  Qu v? c?m th?y ?au ??u, b?t t?nh (ng?t), ho?c b? ho khng ?? sau khi dng thu?c.  Qu v? ho ra mu.  Qu v? b? ?au khi th?.  Qu v? b? ?au ? ng?c, cnh tay, vai ho?c (b?ng).  Qu v? b? s?t.  Qu v? khng th? ?i b? ln c?u thang ho?c t?p th? d?c theo cch qu v? th??ng lm.  Qu v? khng ?? h?n sau th?i gian d? ki?n.  Qu v? g?p kh kh?n trong vi?c th?c hi?n cc sinh ho?t bnh th??ng ngay c? sau khi ngh? ng?i.  Qu v? c cc v?n ?? v?i thu?c c?a qu v?.  Qu v? c b?t k? tri?u ch?ng m?i no. ??M B?O QU V?:  Hi?u r cc h??ng d?n ny.  S? theo di tnh tr?ng c?a mnh.  S? yu c?u tr? gip ngay l?p t?c n?u qu v? c?m th?y khng kh?e ho?c th?y tr?m tr?ng h?n. Document Released: 02/16/2011 Document Revised: 03/04/2013 [facility] Patient Information 2015 [employer] [place]. This information is not intended to replace advice given to you by your health care provider. Make sure you discuss any questions you  have with your health care provider.

## 2014-03-06 NOTE — Progress Notes (Signed)
Discharge instructions given. Pt verbalized understanding and all questions were answered.  

## 2014-03-09 ENCOUNTER — Encounter: Payer: Self-pay | Admitting: Family Medicine

## 2014-03-09 ENCOUNTER — Ambulatory Visit (INDEPENDENT_AMBULATORY_CARE_PROVIDER_SITE_OTHER): Payer: Medicaid Other | Admitting: Family Medicine

## 2014-03-09 VITALS — BP 140/63 | HR 79 | Temp 98.2°F | Wt 95.1 lb

## 2014-03-09 DIAGNOSIS — N179 Acute kidney failure, unspecified: Secondary | ICD-10-CM

## 2014-03-09 DIAGNOSIS — I5031 Acute diastolic (congestive) heart failure: Secondary | ICD-10-CM

## 2014-03-09 DIAGNOSIS — N390 Urinary tract infection, site not specified: Secondary | ICD-10-CM

## 2014-03-09 DIAGNOSIS — Z09 Encounter for follow-up examination after completed treatment for conditions other than malignant neoplasm: Secondary | ICD-10-CM

## 2014-03-09 LAB — BASIC METABOLIC PANEL
BUN: 83 mg/dL — AB (ref 6–23)
CALCIUM: 8.8 mg/dL (ref 8.4–10.5)
CHLORIDE: 108 meq/L (ref 96–112)
CO2: 23 mEq/L (ref 19–32)
CREATININE: 2.45 mg/dL — AB (ref 0.50–1.10)
Glucose, Bld: 151 mg/dL — ABNORMAL HIGH (ref 70–99)
Potassium: 4.7 mEq/L (ref 3.5–5.3)
Sodium: 142 mEq/L (ref 135–145)

## 2014-03-09 NOTE — Progress Notes (Signed)
Patient ID: Yvette [name], female   DOB: 06/04/1916, 80 y.o.   MRN: [id number]  HPI:  Pt presents for a hospital follow up appointment.  She was hospitalized from 12/22 to 12/25 with a CHF exacerbation. Was diuresed and had mild AKI during her admission. Also had transient afib with RVR, started on diltiazem. Also had urine culture, on keflex at discharge. Weight at discharge was 92lb.   Pt is accompanied by her granddaughter today who provides much of the history. She has been taking her diltiazem and keflex and has been tolerating these medications well. Is now eating and drinking well. Her breathing feels good. No chest discomfort.  She was also put on metoprolol during her hospitalization, but has not filled this medication yet. Instead has been taking the same pindolol dose she was on prior to her hospitalization.  ROS: See HPI  PMFSH: hx paroxysmal afib, chronic diastolic CHF, sick sinus syndrome, CKD stage 4, HTN, CAD with hx of STEMI  PHYSICAL EXAM: BP 140/63 mmHg  Pulse 79  Temp(Src) 98.2 F (36.8 C)  Wt 95 lb 1.6 oz (43.137 kg) Gen: NAD HEENT: NCAT, MMM Heart: RRR Lungs: CTAB, NWOB Abdomen: soft, NTTP Neuro: grossly nonfocal, speech normal Ext: No significant appreciable lower extremity edema bilaterally   ASSESSMENT/PLAN:  1. CHF exac - resolved. Appears well. Weight mildly up from hospital discharge (95lb today, 92lb at discharge) but pt wearing multiple items of clothing. Volume and respiratory status good today. Hold off on additional diuresis for now.  2. AKI - likely related to diuresis in the hospital. Now drinking and eating well. Check BMET today to ensure creatinine improving.  3. UTI - final cx results grew e coli pan sensitive. Continue course of keflex.  4. Afib - rate controlled at this time, tolerating diltiazem. Granddaughter unsure whether to fill rx for metoprolol. Currently taking pindolol that she was on prior to admission. I recommended she stay on  pindolol given that she is doing well on this regimen and do not want to make acute changes at this time. Reason for switching to metoprolol not clearly documented in hospital notes. Advised f/u with PCP in the next couple of weeks to discuss outpatient medication regimen.  FOLLOW UP: F/u in 1-2 weeks with PCP for CHF and afib Need to consider restarting diuresis at that time, also discuss metoprolol vs pindolol.  GrenadaBrittany J. Pollie MeyerMcIntyre, MD [facility]

## 2014-03-09 NOTE — Patient Instructions (Signed)
I'm glad you are doing better Continue current medicines Follow up in 1-2 weeks with Dr. Casper HarrisonStreet to discuss pindolol versus metoprolol. Call us if you have any problems Rechecking kidney function today.  Be well, Dr. Pollie MeyerMcIntyre

## 2014-03-09 NOTE — Progress Notes (Signed)
[place] interpreter stphen [id number] was used during this visit. Marlyce Mcdougald C

## 2014-03-10 ENCOUNTER — Encounter: Payer: Self-pay | Admitting: Family Medicine

## 2014-03-13 ENCOUNTER — Other Ambulatory Visit: Payer: Self-pay | Admitting: Family Medicine

## 2014-03-13 ENCOUNTER — Other Ambulatory Visit: Payer: Self-pay

## 2014-03-13 ENCOUNTER — Inpatient Hospital Stay (HOSPITAL_COMMUNITY)
Admission: EM | Admit: 2014-03-13 | Discharge: 2014-03-15 | DRG: 291 | Disposition: A | Discharge status: Hospice | Payer: Medicaid Other | Attending: Family Medicine | Admitting: Family Medicine

## 2014-03-13 ENCOUNTER — Emergency Department (HOSPITAL_COMMUNITY): Payer: Medicaid Other

## 2014-03-13 DIAGNOSIS — N179 Acute kidney failure, unspecified: Secondary | ICD-10-CM | POA: Insufficient documentation

## 2014-03-13 DIAGNOSIS — R197 Diarrhea, unspecified: Secondary | ICD-10-CM | POA: Insufficient documentation

## 2014-03-13 DIAGNOSIS — N184 Chronic kidney disease, stage 4 (severe): Secondary | ICD-10-CM | POA: Diagnosis present

## 2014-03-13 DIAGNOSIS — Z8673 Personal history of transient ischemic attack (TIA), and cerebral infarction without residual deficits: Secondary | ICD-10-CM

## 2014-03-13 DIAGNOSIS — M549 Dorsalgia, unspecified: Secondary | ICD-10-CM | POA: Diagnosis present

## 2014-03-13 DIAGNOSIS — I4891 Unspecified atrial fibrillation: Secondary | ICD-10-CM | POA: Diagnosis present

## 2014-03-13 DIAGNOSIS — J9602 Acute respiratory failure with hypercapnia: Secondary | ICD-10-CM | POA: Diagnosis present

## 2014-03-13 DIAGNOSIS — I129 Hypertensive chronic kidney disease with stage 1 through stage 4 chronic kidney disease, or unspecified chronic kidney disease: Secondary | ICD-10-CM | POA: Diagnosis present

## 2014-03-13 DIAGNOSIS — Z515 Encounter for palliative care: Secondary | ICD-10-CM

## 2014-03-13 DIAGNOSIS — I252 Old myocardial infarction: Secondary | ICD-10-CM

## 2014-03-13 DIAGNOSIS — I2510 Atherosclerotic heart disease of native coronary artery without angina pectoris: Secondary | ICD-10-CM | POA: Diagnosis present

## 2014-03-13 DIAGNOSIS — Z66 Do not resuscitate: Secondary | ICD-10-CM | POA: Diagnosis present

## 2014-03-13 DIAGNOSIS — I5033 Acute on chronic diastolic (congestive) heart failure: Principal | ICD-10-CM | POA: Diagnosis present

## 2014-03-13 DIAGNOSIS — I509 Heart failure, unspecified: Secondary | ICD-10-CM

## 2014-03-13 DIAGNOSIS — R6510 Systemic inflammatory response syndrome (SIRS) of non-infectious origin without acute organ dysfunction: Secondary | ICD-10-CM | POA: Diagnosis present

## 2014-03-13 DIAGNOSIS — F0390 Unspecified dementia without behavioral disturbance: Secondary | ICD-10-CM | POA: Diagnosis present

## 2014-03-13 DIAGNOSIS — F419 Anxiety disorder, unspecified: Secondary | ICD-10-CM | POA: Diagnosis present

## 2014-03-13 DIAGNOSIS — I495 Sick sinus syndrome: Secondary | ICD-10-CM | POA: Diagnosis present

## 2014-03-13 DIAGNOSIS — M1990 Unspecified osteoarthritis, unspecified site: Secondary | ICD-10-CM | POA: Diagnosis present

## 2014-03-13 DIAGNOSIS — G8929 Other chronic pain: Secondary | ICD-10-CM | POA: Diagnosis present

## 2014-03-13 DIAGNOSIS — T50905A Adverse effect of unspecified drugs, medicaments and biological substances, initial encounter: Secondary | ICD-10-CM | POA: Diagnosis present

## 2014-03-13 DIAGNOSIS — Z7982 Long term (current) use of aspirin: Secondary | ICD-10-CM

## 2014-03-13 DIAGNOSIS — J9601 Acute respiratory failure with hypoxia: Secondary | ICD-10-CM | POA: Diagnosis present

## 2014-03-13 DIAGNOSIS — L7621 Postprocedural hemorrhage and hematoma of skin and subcutaneous tissue following a dermatologic procedure: Secondary | ICD-10-CM | POA: Diagnosis present

## 2014-03-13 DIAGNOSIS — E872 Acidosis: Secondary | ICD-10-CM | POA: Diagnosis present

## 2014-03-13 DIAGNOSIS — K921 Melena: Secondary | ICD-10-CM | POA: Diagnosis present

## 2014-03-13 DIAGNOSIS — I4892 Unspecified atrial flutter: Secondary | ICD-10-CM | POA: Diagnosis present

## 2014-03-13 DIAGNOSIS — K625 Hemorrhage of anus and rectum: Secondary | ICD-10-CM | POA: Insufficient documentation

## 2014-03-13 LAB — BLOOD GAS, ARTERIAL
Acid-base deficit: 9.4 mmol/L — ABNORMAL HIGH (ref 0.0–2.0)
BICARBONATE: 16.1 meq/L — AB (ref 20.0–24.0)
Drawn by: 232811
FIO2: 1 %
O2 Saturation: 96.6 %
PATIENT TEMPERATURE: 97.5
PEEP: 5 cmH2O
RATE: 14 resp/min
TCO2: 15 mmol/L (ref 0–100)
pCO2 arterial: 34.1 mmHg — ABNORMAL LOW (ref 35.0–45.0)
pH, Arterial: 7.291 — ABNORMAL LOW (ref 7.350–7.450)
pO2, Arterial: 341 mmHg — ABNORMAL HIGH (ref 80.0–100.0)

## 2014-03-13 LAB — COMPREHENSIVE METABOLIC PANEL
ALT: 21 U/L (ref 0–35)
AST: 28 U/L (ref 0–37)
Albumin: 3.7 g/dL (ref 3.5–5.2)
Alkaline Phosphatase: 50 U/L (ref 39–117)
Anion gap: 12 (ref 5–15)
BILIRUBIN TOTAL: 0.4 mg/dL (ref 0.3–1.2)
BUN: 71 mg/dL — ABNORMAL HIGH (ref 6–23)
CALCIUM: 8.5 mg/dL (ref 8.4–10.5)
CHLORIDE: 109 meq/L (ref 96–112)
CO2: 17 mmol/L — ABNORMAL LOW (ref 19–32)
Creatinine, Ser: 2.56 mg/dL — ABNORMAL HIGH (ref 0.50–1.10)
GFR calc Af Amer: 17 mL/min — ABNORMAL LOW (ref >90)
GFR calc non Af Amer: 15 mL/min — ABNORMAL LOW (ref >90)
Glucose, Bld: 206 mg/dL — ABNORMAL HIGH (ref 70–99)
Potassium: 5.7 mmol/L — ABNORMAL HIGH (ref 3.5–5.1)
SODIUM: 138 mmol/L (ref 135–145)
Total Protein: 8.4 g/dL — ABNORMAL HIGH (ref 6.0–8.3)

## 2014-03-13 LAB — CBC WITH DIFFERENTIAL/PLATELET
Basophils Absolute: 0 10*3/uL (ref 0.0–0.1)
Basophils Relative: 0 % (ref 0–1)
Eosinophils Absolute: 0.3 10*3/uL (ref 0.0–0.7)
Eosinophils Relative: 2 % (ref 0–5)
HCT: 38.1 % (ref 36.0–46.0)
HEMOGLOBIN: 11.7 g/dL — AB (ref 12.0–15.0)
Lymphocytes Relative: 31 % (ref 12–46)
Lymphs Abs: 4.2 10*3/uL — ABNORMAL HIGH (ref 0.7–4.0)
MCH: 29.8 pg (ref 26.0–34.0)
MCHC: 30.7 g/dL (ref 30.0–36.0)
MCV: 97.2 fL (ref 78.0–100.0)
MONOS PCT: 6 % (ref 3–12)
Monocytes Absolute: 0.8 10*3/uL (ref 0.1–1.0)
NEUTROS ABS: 8.3 10*3/uL — AB (ref 1.7–7.7)
NEUTROS PCT: 61 % (ref 43–77)
Platelets: 368 10*3/uL (ref 150–400)
RBC: 3.92 MIL/uL (ref 3.87–5.11)
RDW: 13.5 % (ref 11.5–15.5)
WBC: 13.5 10*3/uL — ABNORMAL HIGH (ref 4.0–10.5)

## 2014-03-13 LAB — TROPONIN I: Troponin I: 0.04 ng/mL — ABNORMAL HIGH (ref <0.031)

## 2014-03-13 LAB — PROTIME-INR
INR: 1.09 (ref 0.00–1.49)
Prothrombin Time: 14.2 seconds (ref 11.6–15.2)

## 2014-03-13 MED ORDER — SUCCINYLCHOLINE CHLORIDE 20 MG/ML IJ SOLN
INTRAMUSCULAR | Status: AC
  Filled 2014-03-13: qty 1

## 2014-03-13 MED ORDER — ROCURONIUM BROMIDE 50 MG/5ML IV SOLN
INTRAVENOUS | Status: AC
  Filled 2014-03-13: qty 2

## 2014-03-13 MED ORDER — ETOMIDATE 2 MG/ML IV SOLN
INTRAVENOUS | Status: AC
  Filled 2014-03-13: qty 20

## 2014-03-13 MED ORDER — FUROSEMIDE 10 MG/ML IJ SOLN
20.0000 mg | Freq: Once | INTRAMUSCULAR | Status: AC
  Administered 2014-03-13: 20 mg via INTRAVENOUS
  Filled 2014-03-13: qty 4

## 2014-03-13 MED ORDER — LIDOCAINE HCL (CARDIAC) 20 MG/ML IV SOLN
INTRAVENOUS | Status: AC
  Filled 2014-03-13: qty 5

## 2014-03-13 NOTE — ED Notes (Signed)
Pt in with foley from home, with little output. This foley removed and new temp foley placed in department. No output at this time. Bladder scan shows no urine in the bladder at this time.

## 2014-03-13 NOTE — ED Notes (Signed)
Bed: WA02 Expected date:  Expected time:  Means of arrival:  Comments: 89 F, SOB

## 2014-03-13 NOTE — ED Notes (Signed)
Per EMS-(from Home) patient having CHF issues x1 year. Has Foley Catheter placed d/t diuretic regimen. SOB for the past 1-1.5 hours. Still audible wheezing. Poor peripheral access. Unable to determine if patient is A&Ox4. Does not speak [place]. VS: HR 50 SpO2 92% on RA.  Albuterol and 0.5mg  Atrovent given.

## 2014-03-14 ENCOUNTER — Encounter (HOSPITAL_COMMUNITY): Payer: Self-pay

## 2014-03-14 DIAGNOSIS — M549 Dorsalgia, unspecified: Secondary | ICD-10-CM | POA: Diagnosis present

## 2014-03-14 DIAGNOSIS — R9431 Abnormal electrocardiogram [ECG] [EKG]: Secondary | ICD-10-CM

## 2014-03-14 DIAGNOSIS — R6510 Systemic inflammatory response syndrome (SIRS) of non-infectious origin without acute organ dysfunction: Secondary | ICD-10-CM | POA: Diagnosis present

## 2014-03-14 DIAGNOSIS — I495 Sick sinus syndrome: Secondary | ICD-10-CM | POA: Diagnosis present

## 2014-03-14 DIAGNOSIS — I252 Old myocardial infarction: Secondary | ICD-10-CM | POA: Diagnosis not present

## 2014-03-14 DIAGNOSIS — G8929 Other chronic pain: Secondary | ICD-10-CM | POA: Diagnosis present

## 2014-03-14 DIAGNOSIS — R7989 Other specified abnormal findings of blood chemistry: Secondary | ICD-10-CM

## 2014-03-14 DIAGNOSIS — I129 Hypertensive chronic kidney disease with stage 1 through stage 4 chronic kidney disease, or unspecified chronic kidney disease: Secondary | ICD-10-CM | POA: Diagnosis present

## 2014-03-14 DIAGNOSIS — K625 Hemorrhage of anus and rectum: Secondary | ICD-10-CM | POA: Insufficient documentation

## 2014-03-14 DIAGNOSIS — I2510 Atherosclerotic heart disease of native coronary artery without angina pectoris: Secondary | ICD-10-CM | POA: Diagnosis present

## 2014-03-14 DIAGNOSIS — I509 Heart failure, unspecified: Secondary | ICD-10-CM

## 2014-03-14 DIAGNOSIS — T50905A Adverse effect of unspecified drugs, medicaments and biological substances, initial encounter: Secondary | ICD-10-CM | POA: Diagnosis present

## 2014-03-14 DIAGNOSIS — J9601 Acute respiratory failure with hypoxia: Secondary | ICD-10-CM | POA: Diagnosis present

## 2014-03-14 DIAGNOSIS — F0390 Unspecified dementia without behavioral disturbance: Secondary | ICD-10-CM | POA: Diagnosis present

## 2014-03-14 DIAGNOSIS — I5033 Acute on chronic diastolic (congestive) heart failure: Secondary | ICD-10-CM | POA: Diagnosis present

## 2014-03-14 DIAGNOSIS — M1990 Unspecified osteoarthritis, unspecified site: Secondary | ICD-10-CM | POA: Diagnosis present

## 2014-03-14 DIAGNOSIS — Z66 Do not resuscitate: Secondary | ICD-10-CM | POA: Diagnosis present

## 2014-03-14 DIAGNOSIS — R0602 Shortness of breath: Secondary | ICD-10-CM | POA: Diagnosis present

## 2014-03-14 DIAGNOSIS — I4892 Unspecified atrial flutter: Secondary | ICD-10-CM | POA: Diagnosis present

## 2014-03-14 DIAGNOSIS — R197 Diarrhea, unspecified: Secondary | ICD-10-CM | POA: Insufficient documentation

## 2014-03-14 DIAGNOSIS — N184 Chronic kidney disease, stage 4 (severe): Secondary | ICD-10-CM | POA: Diagnosis present

## 2014-03-14 DIAGNOSIS — L7621 Postprocedural hemorrhage and hematoma of skin and subcutaneous tissue following a dermatologic procedure: Secondary | ICD-10-CM | POA: Diagnosis present

## 2014-03-14 DIAGNOSIS — N189 Chronic kidney disease, unspecified: Secondary | ICD-10-CM

## 2014-03-14 DIAGNOSIS — Z8673 Personal history of transient ischemic attack (TIA), and cerebral infarction without residual deficits: Secondary | ICD-10-CM | POA: Diagnosis not present

## 2014-03-14 DIAGNOSIS — Z7982 Long term (current) use of aspirin: Secondary | ICD-10-CM | POA: Diagnosis not present

## 2014-03-14 DIAGNOSIS — I4891 Unspecified atrial fibrillation: Secondary | ICD-10-CM | POA: Diagnosis present

## 2014-03-14 DIAGNOSIS — F419 Anxiety disorder, unspecified: Secondary | ICD-10-CM | POA: Diagnosis present

## 2014-03-14 DIAGNOSIS — I481 Persistent atrial fibrillation: Secondary | ICD-10-CM

## 2014-03-14 DIAGNOSIS — N179 Acute kidney failure, unspecified: Secondary | ICD-10-CM | POA: Insufficient documentation

## 2014-03-14 DIAGNOSIS — K921 Melena: Secondary | ICD-10-CM | POA: Diagnosis present

## 2014-03-14 DIAGNOSIS — N185 Chronic kidney disease, stage 5: Secondary | ICD-10-CM

## 2014-03-14 DIAGNOSIS — A419 Sepsis, unspecified organism: Secondary | ICD-10-CM

## 2014-03-14 DIAGNOSIS — E872 Acidosis: Secondary | ICD-10-CM | POA: Diagnosis present

## 2014-03-14 DIAGNOSIS — Z515 Encounter for palliative care: Secondary | ICD-10-CM | POA: Diagnosis not present

## 2014-03-14 DIAGNOSIS — J9602 Acute respiratory failure with hypercapnia: Secondary | ICD-10-CM | POA: Diagnosis present

## 2014-03-14 LAB — URINE MICROSCOPIC-ADD ON

## 2014-03-14 LAB — URINALYSIS, ROUTINE W REFLEX MICROSCOPIC
Bilirubin Urine: NEGATIVE
Glucose, UA: NEGATIVE mg/dL
Ketones, ur: NEGATIVE mg/dL
NITRITE: NEGATIVE
PH: 5.5 (ref 5.0–8.0)
Protein, ur: 30 mg/dL — AB
Specific Gravity, Urine: 1.013 (ref 1.005–1.030)
Urobilinogen, UA: 0.2 mg/dL (ref 0.0–1.0)

## 2014-03-14 LAB — CLOSTRIDIUM DIFFICILE BY PCR: Toxigenic C. Difficile by PCR: NEGATIVE

## 2014-03-14 LAB — BASIC METABOLIC PANEL
Anion gap: 11 (ref 5–15)
BUN: 74 mg/dL — ABNORMAL HIGH (ref 6–23)
CALCIUM: 8.7 mg/dL (ref 8.4–10.5)
CO2: 18 mmol/L — AB (ref 19–32)
CREATININE: 2.65 mg/dL — AB (ref 0.50–1.10)
Chloride: 111 mEq/L (ref 96–112)
GFR calc Af Amer: 16 mL/min — ABNORMAL LOW (ref >90)
GFR, EST NON AFRICAN AMERICAN: 14 mL/min — AB (ref >90)
Glucose, Bld: 124 mg/dL — ABNORMAL HIGH (ref 70–99)
Potassium: 5.3 mmol/L — ABNORMAL HIGH (ref 3.5–5.1)
SODIUM: 140 mmol/L (ref 135–145)

## 2014-03-14 LAB — TROPONIN I
TROPONIN I: 0.11 ng/mL — AB (ref <0.031)
Troponin I: 0.11 ng/mL — ABNORMAL HIGH (ref <0.031)
Troponin I: 0.08 ng/mL — ABNORMAL HIGH (ref <0.031)

## 2014-03-14 LAB — CBC
HEMATOCRIT: 38.2 % (ref 36.0–46.0)
Hemoglobin: 11.9 g/dL — ABNORMAL LOW (ref 12.0–15.0)
MCH: 29.8 pg (ref 26.0–34.0)
MCHC: 31.2 g/dL (ref 30.0–36.0)
MCV: 95.7 fL (ref 78.0–100.0)
PLATELETS: 307 10*3/uL (ref 150–400)
RBC: 3.99 MIL/uL (ref 3.87–5.11)
RDW: 13.6 % (ref 11.5–15.5)
WBC: 13.9 10*3/uL — ABNORMAL HIGH (ref 4.0–10.5)

## 2014-03-14 LAB — BRAIN NATRIURETIC PEPTIDE: B NATRIURETIC PEPTIDE 5: 978.6 pg/mL — AB (ref 0.0–100.0)

## 2014-03-14 LAB — OCCULT BLOOD X 1 CARD TO LAB, STOOL: Fecal Occult Bld: POSITIVE — AB

## 2014-03-14 MED ORDER — DICLOFENAC SODIUM 1 % TD GEL: TRANSDERMAL | Status: DC

## 2014-03-14 MED ORDER — URIBEL 118 MG PO CAPS: 1.0000 | ORAL_CAPSULE | Freq: Four times a day (QID) | ORAL | Status: DC

## 2014-03-14 MED ORDER — MORPHINE SULFATE (CONCENTRATE) 10 MG/0.5ML PO SOLN: 5.0000 mg | ORAL | Status: DC | PRN

## 2014-03-14 MED ORDER — SODIUM CHLORIDE 0.9 % IJ SOLN
3.0000 mL | Freq: Two times a day (BID) | INTRAMUSCULAR | Status: DC
  Administered 2014-03-14 – 2014-03-15 (×3): 3 mL via INTRAVENOUS

## 2014-03-14 MED ORDER — HALOPERIDOL LACTATE 2 MG/ML PO CONC
0.5000 mg | Freq: Four times a day (QID) | ORAL | Status: DC | PRN
  Filled 2014-03-14: qty 0.3

## 2014-03-14 MED ORDER — ADULT MULTIVITAMIN W/MINERALS CH
1.0000 | ORAL_TABLET | Freq: Every day | ORAL | Status: DC
  Administered 2014-03-14: 1 via ORAL
  Filled 2014-03-14: qty 1

## 2014-03-14 MED ORDER — FUROSEMIDE 10 MG/ML IJ SOLN
40.0000 mg | Freq: Two times a day (BID) | INTRAMUSCULAR | Status: DC
  Administered 2014-03-14 – 2014-03-15 (×3): 40 mg via INTRAVENOUS
  Filled 2014-03-14 (×5): qty 4

## 2014-03-14 MED ORDER — DICLOFENAC SODIUM 1 % TD GEL: 2.0000 g | Freq: Four times a day (QID) | TRANSDERMAL | Status: DC | PRN

## 2014-03-14 MED ORDER — PANTOPRAZOLE SODIUM 40 MG PO TBEC
40.0000 mg | DELAYED_RELEASE_TABLET | Freq: Every day | ORAL | Status: DC
  Administered 2014-03-14: 40 mg via ORAL
  Filled 2014-03-14: qty 1

## 2014-03-14 MED ORDER — CHLORHEXIDINE GLUCONATE 0.12 % MT SOLN
15.0000 mL | Freq: Two times a day (BID) | OROMUCOSAL | Status: DC
  Filled 2014-03-14 (×5): qty 15

## 2014-03-14 MED ORDER — CETYLPYRIDINIUM CHLORIDE 0.05 % MT LIQD: 7.0000 mL | Freq: Two times a day (BID) | OROMUCOSAL | Status: DC

## 2014-03-14 MED ORDER — DILTIAZEM HCL 90 MG PO TABS
90.0000 mg | ORAL_TABLET | Freq: Four times a day (QID) | ORAL | Status: DC
  Administered 2014-03-15 (×2): 90 mg via ORAL
  Filled 2014-03-14 (×9): qty 1

## 2014-03-14 MED ORDER — METOPROLOL TARTRATE 12.5 MG HALF TABLET
12.5000 mg | ORAL_TABLET | Freq: Two times a day (BID) | ORAL | Status: DC
  Administered 2014-03-14 (×2): 12.5 mg via ORAL
  Filled 2014-03-14 (×4): qty 1

## 2014-03-14 MED ORDER — HEPARIN SODIUM (PORCINE) 5000 UNIT/ML IJ SOLN
5000.0000 [IU] | Freq: Three times a day (TID) | INTRAMUSCULAR | Status: DC
  Administered 2014-03-14 (×2): 5000 [IU] via SUBCUTANEOUS
  Filled 2014-03-14 (×4): qty 1

## 2014-03-14 MED ORDER — ASPIRIN EC 81 MG PO TBEC
81.0000 mg | DELAYED_RELEASE_TABLET | Freq: Every day | ORAL | Status: DC
  Administered 2014-03-14: 81 mg via ORAL
  Filled 2014-03-14 (×2): qty 1

## 2014-03-14 MED ORDER — MELATONIN 5 MG PO TABS: 10.0000 mg | ORAL_TABLET | Freq: Every day | ORAL | Status: DC

## 2014-03-14 MED ORDER — MORPHINE SULFATE 2 MG/ML IJ SOLN
1.0000 mg | Freq: Once | INTRAMUSCULAR | Status: AC
  Administered 2014-03-14: 1 mg via INTRAVENOUS
  Filled 2014-03-14: qty 1

## 2014-03-14 MED ORDER — ISOSORBIDE DINITRATE 5 MG PO TABS
7.5000 mg | ORAL_TABLET | Freq: Two times a day (BID) | ORAL | Status: DC
  Administered 2014-03-14 (×2): 7.5 mg via ORAL
  Filled 2014-03-14 (×6): qty 2

## 2014-03-14 NOTE — Addendum Note (Signed)
Addended by: Bobbye Morton on: 03/14/2014 09:07 PM   Modules accepted: Orders

## 2014-03-14 NOTE — Consult Note (Signed)
CARDIOLOGY CONSULT NOTE   Patient ID: Yvette [name] MRN: [id number] DOB/AGE: 1916-10-03 81 y.o.  Admit date: 03/13/2014  Primary Physician   Street, Cristal Deer, MD Primary Cardiologist   [facility] Reason for Consultation   Abnormal ECG and AFib  Yvette [name] is a 80 y.o. female with   history of CAD(STEMI--2012  Medical therapy)  HFpEF, Afib-paroxysmal not on coumadin 2/2 falls, SSS, HTN and  10/15  hospitalized with CHF and + tn  Echo unchanged and managed medically with diuresis with some concerns re renal insuff  Cr 2.6 GFR(class 5) Rehospitalized last week with similar complaints  Palliative care broached but decilined  Similar therapies as before  Again admitted 1/1 with sob and Tn 0.11 on two successive occasions .  ECG concerning with new qWave in III but denies chest pain  ECG last week had diffuse and deep TW inversions which are now gone  Pk troponin was 0.15  She is unable to answer questions at this time    Past Medical History  Diagnosis Date  . Paroxysmal atrial flutter [facility] admission 2/5-04/20/2010    High fall risk-not a Coumadin candidate  . Hypertension   . Chronic diastolic heart failure     a. Echo February 2012: EF 55%; moderate LVH; mild AI; mild MR; PASP 39  . Sick sinus syndrome     a. Limits use of beta blocker-pindolol tolerated  . CAD (coronary artery disease) PRESUMED    a. 04/2010 NSTEMI->med rx;  b. 09/2012 inf stemi->med rx.  . Arthritis   . Anxiety   . Hydronephrosis     Chronic  . Chronic back pain   . Stroke   . Herpes zoster 03/01/2012  . Myocardial infarction   . CHF (congestive heart failure)   . Chronic indwelling Foley catheter 11/10/2013  . Cataracts, bilateral, very poor vision 12/25/2013  . UTI (urinary tract infection) 10/04/2012  . Unstable angina 10/04/2012  . SOB (shortness of breath) 10/05/2011  . Respiratory distress 11/10/2013  . Protein-calorie malnutrition, severe 11/11/2013  . Physical deconditioning 04/26/2011  . Loss of  weight 08/29/2012  . Fatigue 10/04/2011  . Encounter for palliative care 07/24/2012  . Acute on chronic diastolic CHF (congestive heart failure), NYHA class 1 10/05/2011  . History of ST elevation myocardial infarction (STEMI) 09/24/2012     Past Surgical History  Procedure Laterality Date  . Esophagogastroduodenoscopy  04/19/2011    Procedure: ESOPHAGOGASTRODUODENOSCOPY (EGD);  Surgeon: Erick Blinks, MD;  Location: [facility] ENDOSCOPY;  Service: Gastroenterology;  Laterality: N/A;    No Known Allergies  I have reviewed the patient's current medications . antiseptic oral rinse  7 mL Mouth Rinse q12n4p  . aspirin EC  81 mg Oral Daily  . chlorhexidine  15 mL Mouth Rinse BID  . diltiazem  90 mg Oral 4 times per day  . etomidate      . furosemide  40 mg Intravenous BID  . heparin  5,000 Units Subcutaneous 3 times per day  . isosorbide dinitrate  7.5 mg Oral BID  . lidocaine (cardiac) 100 mg/94ml      . metoprolol tartrate  12.5 mg Oral BID  . multivitamin with minerals  1 tablet Oral Daily  . pantoprazole  40 mg Oral Daily  . rocuronium      . sodium chloride  3 mL Intravenous Q12H  . succinylcholine         diclofenac sodium  Prior to Admission medications   Medication Sig Start Date  End Date Taking? Authorizing Provider  aspirin EC 81 MG tablet Take 81 mg by mouth daily.   Yes Historical Provider, MD  diltiazem (CARDIZEM CD) 360 MG 24 hr capsule Take 1 capsule (360 mg total) by mouth daily. 03/07/14  Yes Jamal Collin, MD  isosorbide mononitrate (IMDUR) 30 MG 24 hr tablet Take 15 mg by mouth daily.   Yes Historical Provider, MD  Melatonin 5 MG TABS Take 10 mg by mouth at bedtime.    Yes Historical Provider, MD  Meth-Hyo-M Bl-Na Phos-Ph Sal (URIBEL) 118 MG CAPS Take 1 capsule by mouth 4 (four) times daily.   Yes Historical Provider, MD  metoprolol tartrate (LOPRESSOR) 12.5 mg TABS tablet Take 0.5 tablets (12.5 mg total) by mouth 2 (two) times daily. (BETA BLOCKER); Crush and administer in  puree 03/06/14  Yes Glori Luis, MD  Multiple Vitamin (MULTIVITAMIN WITH MINERALS) TABS tablet Take 1 tablet by mouth daily.   Yes Historical Provider, MD  pantoprazole (PROTONIX) 40 MG tablet Take 40 mg by mouth daily.   Yes Historical Provider, MD  pindolol (VISKEN) 5 MG tablet Take 2.5-5 mg by mouth 2 (two) times daily. Take 1 tablet in the morning Take 0.5 tablet  In the evening   Yes Historical Provider, MD  torsemide (DEMADEX) 20 MG tablet Take 20 mg by mouth 4 (four) times a week.   Yes Historical Provider, MD  diclofenac sodium (VOLTAREN) 1 % GEL Apply 2 g topically 4 (four) times daily. Patient taking differently: Apply 2 g topically 4 (four) times daily as needed.  10/03/13   Stephanie Coup Street, MD  nitroGLYCERIN (NITROSTAT) 0.4 MG SL tablet Place 1 tablet (0.4 mg total) under the tongue every 5 (five) minutes as needed for chest pain. 10/04/12   Ok Anis, NP  silver sulfADIAZINE (SILVADENE) 1 % cream Apply 1 application topically daily. To affected skin areas. 01/14/14   Bobbye Morton, MD     History   Social History  . Marital Status: Widowed    Spouse Name: N/A    Number of Children: 7  . Years of Education: 9   Occupational History  . Retired    Social History Main Topics  . Smoking status: Never Smoker   . Smokeless tobacco: Never Used  . Alcohol Use: No  . Drug Use: No  . Sexual Activity: No   Other Topics Concern  . Not on file   Social History Narrative   Pt lives with family. She has 24 hour care.    Family Status  Relation Status Death Age  . Mother Deceased   . Father Deceased    Family History  Problem Relation Age of Onset  . CAD Neg Hx      ROS:  Full 14 point review of systems complete and found to be negative unless listed above.  Physical Exam: Blood pressure 120/53, pulse 80, temperature 97.1 F (36.2 C), temperature source Axillary, resp. rate 22, height  (1.372 m), weight 102 lb 11.8 oz (46.6 kg), SpO2 96 %.    General: Well developed, cachectic somnolent   female    Head: Eyes PERRLA, No xanthomas.   Normocephalic and atraumatic, oropharynx without edema or exudate. Dentition:  Lungs: coarse breathsounds Heart: irregularly irregular rate and rhythm    Abdomen: Bowel sounds present, abdomen soft and non-tender without masses or hernias noted. Extremities: No clubbing or cyanosis.  edema.  Neuro: somnolent Skin: No rashes or lesions noted.  Labs:  Lab Results  Component Value Date   WBC 13.9* 03/14/2014   HGB 11.9* 03/14/2014   HCT 38.2 03/14/2014   MCV 95.7 03/14/2014   PLT 307 03/14/2014    Recent Labs  03/13/14 2108  INR 1.09    Recent Labs Lab 03/13/14 2108 03/14/14 0400  NA 138 140  K 5.7* 5.3*  CL 109 111  CO2 17* 18*  BUN 71* 74*  CREATININE 2.56* 2.65*  CALCIUM 8.5 8.7  PROT 8.4*  --   BILITOT 0.4  --   ALKPHOS 50  --   ALT 21  --   AST 28  --   GLUCOSE 206* 124*  ALBUMIN 3.7  --    MAGNESIUM  Date Value Ref Range Status  11/10/2013 2.5 1.5 - 2.5 mg/dL Final    Recent Labs  16/10/96 2108 03/14/14 0400  TROPONINI 0.04* 0.11*   No results for input(s): TROPIPOC in the last 72 hours. PRO B NATRIURETIC PEPTIDE (BNP)  Date/Time Value Ref Range Status  01/08/2014 06:10 AM 8164.0* 0 - 450 pg/mL Final  01/05/2014 04:00 AM 9191.0* 0 - 450 pg/mL Final   Lab Results  Component Value Date   CHOL 153 09/25/2012   HDL 54 09/25/2012   LDLCALC 87 09/25/2012   TRIG 60 09/25/2012   No results found for: DDIMER LIPASE  Date/Time Value Ref Range Status  01/05/2014 04:21 AM 118* 11 - 59 U/L Final   AMYLASE  Date/Time Value Ref Range Status  07/21/2012 11:08 PM 104 0 - 105 U/L Final   TSH  Date/Time Value Ref Range Status  11/10/2013 06:41 AM 1.690 0.350 - 4.500 uIU/mL Final   No results found for: VITAMINB12, FOLATE, FERRITIN, TIBC, IRON, RETICCTPCT     ECG:   NSR 89 16/08/40 Q wave III new since yesterday  Mild STElevation   Radiology:  Dg Chest  Port 1 View  03/13/2014   CLINICAL DATA:  Acute onset of difficulty breathing. Generalized abdominal pain. Initial encounter.  EXAM: PORTABLE CHEST - 1 VIEW  COMPARISON:  Chest radiograph from 03/03/2014  FINDINGS: The lungs are well-aerated. Vascular congestion is noted. Bibasilar airspace opacities and increased interstitial markings raise concern for mild interstitial edema. Small bilateral pleural effusions are suspected. No pneumothorax is seen.  The cardiomediastinal silhouette is borderline normal in size. No acute osseous abnormalities are seen.  IMPRESSION: Vascular congestion noted. Bibasilar airspace opacities and increased interstitial markings raise concern for mild interstitial edema. Suspect small bilateral pleural effusions.   Electronically Signed   By: Roanna Raider M.D.   On: 02-03-202016 22:32    ASSESSMENT AND PLAN:   .  Active Problems:   CHF exacerbation   Acute exacerbation of CHF (congestive heart failure) Renal failure  Abnormal ECG Blood in stool + troponin  Have spoken with FPTS and the grandson.  With renal issues and general approach which now includes palliative care and hopes to go home withcomfort care as expressed by grandson, I would choose medical therapy, limited to heparin and ASA but there are GI bleeding issues which are pending  Signed: Theodore Demark, PA-C 03/14/2014 8:56 AM Beeper [phone]  Co-Sign MD

## 2014-03-14 NOTE — ED Notes (Signed)
Per MD pt cleared to come off Bipap. Resp contacted.

## 2014-03-14 NOTE — ED Provider Notes (Signed)
CSN: [id number]     Arrival date & time 03/13/14  2035 History   First MD Initiated Contact with Patient 03/13/14 2046     Chief Complaint  Patient presents with  . Congestive Heart Failure  . Shortness of Breath     (Consider location/radiation/quality/duration/timing/severity/associated sxs/prior Treatment) HPI The patient became very short of breath over several hours. The patient has had similar type episodes with congestive heart failure. She has had a recent hospitalization for similar. The patient arrives in significant distress and her daughter is the historian. She denies she's had fever. She denies vomiting or diarrhea leading up to this. She reports she has never been intubated but has had to be on the "oxygen mask" before. Past Medical History  Diagnosis Date  . Paroxysmal atrial flutter [facility] admission 2/5-04/20/2010    High fall risk-not a Coumadin candidate  . Hypertension   . Chronic diastolic heart failure     a. Echo February 2012: EF 55%; moderate LVH; mild AI; mild MR; PASP 39  . Sick sinus syndrome     a. Limits use of beta blocker-pindolol tolerated  . CAD (coronary artery disease) PRESUMED    a. 04/2010 NSTEMI->med rx;  b. 09/2012 inf stemi->med rx.  . Arthritis   . Anxiety   . Hydronephrosis     Chronic  . Chronic back pain   . Stroke   . Herpes zoster 03/01/2012  . Myocardial infarction   . CHF (congestive heart failure)   . Chronic indwelling Foley catheter 11/10/2013  . Cataracts, bilateral, very poor vision 12/25/2013  . UTI (urinary tract infection) 10/04/2012  . Unstable angina 10/04/2012  . SOB (shortness of breath) 10/05/2011  . Respiratory distress 11/10/2013  . Protein-calorie malnutrition, severe 11/11/2013  . Physical deconditioning 04/26/2011  . Loss of weight 08/29/2012  . Fatigue 10/04/2011  . Encounter for palliative care 07/24/2012  . Acute on chronic diastolic CHF (congestive heart failure), NYHA class 1 10/05/2011  . History of ST elevation  myocardial infarction (STEMI) 09/24/2012   Past Surgical History  Procedure Laterality Date  . Esophagogastroduodenoscopy  04/19/2011    Procedure: ESOPHAGOGASTRODUODENOSCOPY (EGD);  Surgeon: Erick Blinks, MD;  Location: [facility] ENDOSCOPY;  Service: Gastroenterology;  Laterality: N/A;   Family History  Problem Relation Age of Onset  . CAD Neg Hx    History  Substance Use Topics  . Smoking status: Never Smoker   . Smokeless tobacco: Never Used  . Alcohol Use: No   OB History    No data available     Review of Systems Patient cannot provide review systems is as given per family member.   Allergies  Review of patient's allergies indicates no known allergies.  Home Medications   Prior to Admission medications   Medication Sig Start Date End Date Taking? Authorizing Provider  aspirin EC 81 MG tablet Take 81 mg by mouth daily.   Yes Historical Provider, MD  diltiazem (CARDIZEM CD) 360 MG 24 hr capsule Take 1 capsule (360 mg total) by mouth daily. 03/07/14  Yes Jamal Collin, MD  isosorbide mononitrate (IMDUR) 30 MG 24 hr tablet Take 15 mg by mouth daily.   Yes Historical Provider, MD  Melatonin 5 MG TABS Take 10 mg by mouth at bedtime.    Yes Historical Provider, MD  Meth-Hyo-M Bl-Na Phos-Ph Sal (URIBEL) 118 MG CAPS Take 1 capsule by mouth 4 (four) times daily.   Yes Historical Provider, MD  metoprolol tartrate (LOPRESSOR) 12.5 mg TABS tablet Take 0.5  tablets (12.5 mg total) by mouth 2 (two) times daily. (BETA BLOCKER); Crush and administer in puree 03/06/14  Yes Glori Luis, MD  Multiple Vitamin (MULTIVITAMIN WITH MINERALS) TABS tablet Take 1 tablet by mouth daily.   Yes Historical Provider, MD  pantoprazole (PROTONIX) 40 MG tablet Take 40 mg by mouth daily.   Yes Historical Provider, MD  pindolol (VISKEN) 5 MG tablet Take 2.5-5 mg by mouth 2 (two) times daily. Take 1 tablet in the morning Take 0.5 tablet  In the evening   Yes Historical Provider, MD  torsemide (DEMADEX) 20 MG  tablet Take 20 mg by mouth 4 (four) times a week.   Yes Historical Provider, MD  diclofenac sodium (VOLTAREN) 1 % GEL Apply 2 g topically 4 (four) times daily. Patient taking differently: Apply 2 g topically 4 (four) times daily as needed.  10/03/13   Stephanie Coup Street, MD  nitroGLYCERIN (NITROSTAT) 0.4 MG SL tablet Place 1 tablet (0.4 mg total) under the tongue every 5 (five) minutes as needed for chest pain. 10/04/12   Ok Anis, NP  silver sulfADIAZINE (SILVADENE) 1 % cream Apply 1 application topically daily. To affected skin areas. 01/14/14   Stephanie Coup Street, MD   BP 127/52 mmHg  Pulse 58  Temp(Src) 97.5 F (36.4 C) (Core (Comment))  Resp 20  SpO2 100% Physical Exam  Constitutional:  On arrival patient is in severe distress with agonal-type of respirations. She is pale in appearance. She is a very frail elderly appearing female.  HENT:  Head: Normocephalic and atraumatic.  Extremely poor dentition and dry mucous memories.  Cardiovascular:  At first assessment the patient is bradycardic. Her pulses however are easily palpable. The monitor shows irregularly irregular rhythm.  Pulmonary/Chest: She is in respiratory distress.  Patient arrives to severe respiratory distress and diffuse rales.  Abdominal: Soft.  Patient's abdomen is mildly distended but not firm. She has a large hematoma in the lower abdomen which daughter reports is due to subcutaneous injections.  Musculoskeletal: She exhibits no edema or tenderness.  Neurological:  Patient arrives confused and somewhat obtunded.  Skin: Skin is warm and dry.    ED Course  Procedures (including critical care time) Labs Review Labs Reviewed  COMPREHENSIVE METABOLIC PANEL - Abnormal; Notable for the following:    Potassium 5.7 (*)    CO2 17 (*)    Glucose, Bld 206 (*)    BUN 71 (*)    Creatinine, Ser 2.56 (*)    Total Protein 8.4 (*)    GFR calc non Af Amer 15 (*)    GFR calc Af Amer 17 (*)    All other  components within normal limits  TROPONIN I - Abnormal; Notable for the following:    Troponin I 0.04 (*)    All other components within normal limits  CBC WITH DIFFERENTIAL - Abnormal; Notable for the following:    WBC 13.5 (*)    Hemoglobin 11.7 (*)    Neutro Abs 8.3 (*)    Lymphs Abs 4.2 (*)    All other components within normal limits  BLOOD GAS, ARTERIAL - Abnormal; Notable for the following:    pH, Arterial 7.291 (*)    pCO2 arterial 34.1 (*)    pO2, Arterial 341.0 (*)    Bicarbonate 16.1 (*)    Acid-base deficit 9.4 (*)    All other components within normal limits  CULTURE, BLOOD (ROUTINE X 2)  CULTURE, BLOOD (ROUTINE X 2)  PROTIME-INR  BRAIN  NATRIURETIC PEPTIDE  URINALYSIS, ROUTINE W REFLEX MICROSCOPIC    Imaging Review Dg Chest Port 1 View  03/13/2014   CLINICAL DATA:  Acute onset of difficulty breathing. Generalized abdominal pain. Initial encounter.  EXAM: PORTABLE CHEST - 1 VIEW  COMPARISON:  Chest radiograph from 03/03/2014  FINDINGS: The lungs are well-aerated. Vascular congestion is noted. Bibasilar airspace opacities and increased interstitial markings raise concern for mild interstitial edema. Small bilateral pleural effusions are suspected. No pneumothorax is seen.  The cardiomediastinal silhouette is borderline normal in size. No acute osseous abnormalities are seen.  IMPRESSION: Vascular congestion noted. Bibasilar airspace opacities and increased interstitial markings raise concern for mild interstitial edema. Suspect small bilateral pleural effusions.   Electronically Signed   By: Roanna Raider M.D.   On: 05-28-2014 22:32     EKG Interpretation None     On initial presentation the patient was in critical condition. Bag-valve-mask was initiated. She did however have good perfusing pulses which were bradycardic. With respiratory support the patient continued to improve. Her heart rate improved to the 60s and 70s. She was placed on BiPAP. Now with recheck at  midnight she is awake with her eyes open communicating with her daughter and appears appropriate to trial on nonrebreather mask. The patient has been given Lasix. CRITICAL CARE Performed by: Arby Barrette   Total critical care time: 40  Critical care time was exclusive of separately billable procedures and treating other patients.  Critical care was necessary to treat or prevent imminent or life-threatening deterioration.  Critical care was time spent personally by me on the following activities: development of treatment plan with patient and/or surrogate as well as nursing, discussions with consultants, evaluation of patient's response to treatment, examination of patient, obtaining history from patient or surrogate, ordering and performing treatments and interventions, ordering and review of laboratory studies, ordering and review of radiographic studies, pulse oximetry and re-evaluation of patient's condition. MDM   Final diagnoses:  CHF exacerbation   The patient arrives as outlined above in critical condition. She has responded to treatment. At this point time she will be admitted for further observation.     Arby Barrette, MD 03/14/14 [phone]

## 2014-03-14 NOTE — H&P (Signed)
Family Medicine Teaching [facility] Admission History and Physical Service Pager: [phone]  Patient name: Yvette [name] Medical record number: [id number] Date of birth: 08-Mar-1917 Age: 81 y.o. Gender: female  Primary Care Provider: Maryjean Ka, MD Consultants: Cardiology and Palliative care in AM Code Status: full code  Chief Complaint: SOB  Assessment and Plan: Yvette [name] is a 81 y.o. female presenting with SOB likely 2/2 CHF exacerbation. PMH is significant for dementia, HTN, CKD, anxiety, paroxysmal atrial flutter, dCHF, and sick sinus syndrome.  #Acute Respiratory failure, possible due to Acute on Chronic diastolic CHF exacerbation - Echo (01/06/14) showed grade 1 diastolic CHF w/ EF of 60-65%. BNP 978.6. EKG unchanged from previous, troponin mildly elevated (0.04), must consider ACS as possible etiology of SOB, despite denial of CP. - Monitor on telemetry - Continue home ASA, metoprolol (holding home pindolol), Imdur, diltiazem (immediate release version). Not on ACE due to renal fxn most likely  - Start Lasix IV 40 mg BID and reassess fluid status and BMET (monitor Cr closely). 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 Currently normotensive. - Continue home Metoprolol, Imdur, and Diltiazem  #Hx of A fib with RVR/Sick Sinus Syndrome: Not on anticoagulation 2/2 high fall risk. No RVR currently -  Continue to monitor on tele  #Leukocytosis/SIRS: WBC 13.5 on admission, and tachypnic on presentation.  Therefore, meeting SIRS criteria.No concerning infiltrates on CXR. UA with large leukocytes, few bacteria, nitrite negative, but no symptoms. Is having diarrhea since arrival to floor, recently hospitalized - Monitor for signs/sxs of infection, fever curve and WBC - BCx pending - UCx ordered - C diff PCR, enteric precautions - Will not empirically treat at this time  #AKI on CKD Stage III - IV - Baseline creatinine around 1.6, currently 2.56 (similar to creatinine during last hospitalization 12/22, seems to have not recovered from diuresis during that hospitalization) - Monitor closely with BMETs - especially while diuresing - Limit nephrotoxic agents   #Hyperkalemia - 2/2 renal failure vs acidosis - Repeat BMET in AM - Likely to improve with Lasix  FEN/GI: SLIV, Heart Healthy Diet Prophylaxis: Heparin SQ  Disposition: Admit to SDU, dispo pending palliative care discussions, improvement in respiratory status, and diuresis  History of Present Illness: Yvette [name] is a 81 y.o. female presenting with SOB. Patient started having symptoms of shortness of breath around 5pm last night. She has been adherent with toresemide (taking 4 times per week). Her weight was 98, which was up 3lbs from her dry weight. She has stable two pillow orthopnea with no nighttime awakenings. No fevers, coughing, CP. No dysuria, urgency or frequency. She live at home with daughter. Daughter interested in palliative care consult on this admission. Daughter reports that patient seems to be at her functional baseline with exception of SOB.  In ED: BiPAP, Lasix  IV x1  Review Of Systems: Per  HPI with the following additions: diarrhea and incontinence of stool x3 since arrival to floor Otherwise 12 point review of systems was performed and was unremarkable.  Patient Active Problem List   Diagnosis Date Noted  .  CHF exacerbation 03/14/2014  . Acute on chronic renal insufficiency   . Acute respiratory failure, unspecified whether with hypoxia or hypercapnia   . Atrial fibrillation with rapid ventricular response   . Respiratory failure 03/03/2014  . Acute respiratory failure with hypoxemia 03/03/2014  . Urinary tract infectious disease   . Tear of anal skin 01/12/2014  . Elevated troponin- demand ischemia 01/07/2014  . Flash pulmonary edema 01/05/2014  . Acute respiratory failure 01/05/2014  . Healthcare-associated pneumonia 01/05/2014  . Cataracts, bilateral, very poor vision 12/25/2013  . Poor vision 12/25/2013  . Impaired mobility and ADLs 12/25/2013  . Hearing loss 12/25/2013  . Cerumen impaction 12/25/2013  . Dementia 12/25/2013  . Circadian rhythm disorder 12/25/2013  . Chronic indwelling Foley catheter 11/10/2013  . Unstable angina, history of 10/04/2012  . History of ST elevation myocardial infarction (STEMI) 09/24/2012  . Vertebral fracture 07/28/2012  . CAD- STEMI 2012 07/25/2012  . Urinary retention 07/25/2012  . Acute on chronic diastolic CHF (congestive heart failure) 10/05/2011  . HTN (hypertension) 10/05/2011  . Chronic kidney disease (CKD), stage IV (severe) 04/26/2011  . Anxiety 05/05/2010  . Paroxysmal atrial flutter   . Chronic diastolic heart failure   . Sick sinus syndrome   . BRADYCARDIA-TACHYCARDIA SYNDROME 05/02/2010   Past Medical History: Past Medical History  Diagnosis Date  . Paroxysmal atrial flutter [facility] admission 2/5-04/20/2010    High fall risk-not a Coumadin candidate  . Hypertension   . Chronic diastolic heart failure     a. Echo February 2012: EF 55%; moderate LVH; mild AI; mild MR; PASP 39  . Sick sinus syndrome     a. Limits use of beta blocker-pindolol tolerated  . CAD (coronary artery disease) PRESUMED    a. 04/2010 NSTEMI->med rx;  b. 09/2012 inf stemi->med rx.  . Arthritis   . Anxiety   . Hydronephrosis     Chronic  . Chronic back pain   .  Stroke   . Herpes zoster 03/01/2012  . Myocardial infarction   . CHF (congestive heart failure)   . Chronic indwelling Foley catheter 11/10/2013  . Cataracts, bilateral, very poor vision 12/25/2013  . UTI (urinary tract infection) 10/04/2012  . Unstable angina 10/04/2012  . SOB (shortness of breath) 10/05/2011  . Respiratory distress 11/10/2013  . Protein-calorie malnutrition, severe 11/11/2013  . Physical deconditioning 04/26/2011  . Loss of weight 08/29/2012  . Fatigue 10/04/2011  . Encounter for palliative care 07/24/2012  . Acute on chronic diastolic CHF (congestive heart failure), NYHA class 1 10/05/2011  . History of ST elevation myocardial infarction (STEMI) 09/24/2012   Past Surgical History: Past Surgical History  Procedure Laterality Date  . Esophagogastroduodenoscopy  04/19/2011    Procedure: ESOPHAGOGASTRODUODENOSCOPY (EGD);  Surgeon: Erick Blinks, MD;  Location: [facility] ENDOSCOPY;  Service: Gastroenterology;  Laterality: N/A;   Social History: History  Substance Use Topics  . Smoking status: Never Smoker   . Smokeless tobacco: Never Used  . Alcohol Use: No   Additional social history: none  Please also refer to relevant sections of EMR.  Family History: Family History  Problem Relation Age of Onset  . CAD Neg Hx    Allergies and Medications: No Known Allergies No current facility-administered medications on file prior to encounter.   Current  Outpatient Prescriptions on File Prior to Encounter  Medication Sig Dispense Refill  . aspirin EC 81 MG tablet Take 81 mg by mouth daily.    Marland Kitchen diltiazem (CARDIZEM CD) 360 MG 24 hr capsule Take 1 capsule (360 mg total) by mouth daily. 30 capsule 0  . isosorbide mononitrate (IMDUR) 30 MG 24 hr tablet Take 15 mg by mouth daily.    . Melatonin 5 MG TABS Take 10 mg by mouth at bedtime.     . Meth-Hyo-M Bl-Na Phos-Ph Sal (URIBEL) 118 MG CAPS Take 1 capsule by mouth 4 (four) times daily.    . metoprolol tartrate (LOPRESSOR) 12.5 mg TABS tablet  Take 0.5 tablets (12.5 mg total) by mouth 2 (two) times daily. (BETA BLOCKER); Crush and administer in puree 60 tablet 0  . Multiple Vitamin (MULTIVITAMIN WITH MINERALS) TABS tablet Take 1 tablet by mouth daily.    . pantoprazole (PROTONIX) 40 MG tablet Take 40 mg by mouth daily.    . diclofenac sodium (VOLTAREN) 1 % GEL Apply 2 g topically 4 (four) times daily. (Patient taking differently: Apply 2 g topically 4 (four) times daily as needed. ) 1 Tube 5  . nitroGLYCERIN (NITROSTAT) 0.4 MG SL tablet Place 1 tablet (0.4 mg total) under the tongue every 5 (five) minutes as needed for chest pain. 25 tablet 3  . silver sulfADIAZINE (SILVADENE) 1 % cream Apply 1 application topically daily. To affected skin areas. 50 g 1    Objective: BP 127/52 mmHg  Pulse 58  Temp(Src) 97.5 F (36.4 C) (Core (Comment))  Resp 20  SpO2 100% Exam: General: Thin, elderly women, resting comfortably, NAD. Daughter at bedside HEENT: NCAT, Dry, cracked lips Cardiovascular: irregularly irregular rhythm, no m/r/g. DP pulses 2+ bilaterally Respiratory: Normal WOB, venti-mask partially in place, Coarse breath sounds throughout with some rhonchi (could be transmitted upper airway sounds while snoring), no wheezes/crackles,  Abdomen: Distended, soft, diffusely mildly TTP, +BS, no masses/rebound/guarding Extremities: Cool to touch, no edema Skin: Intact, no rashes noted, large ecchymoses over abdomen Neuro: Sleeping, but easily aroused, moves all extremities.  Labs and Imaging: CBC BMET   Recent Labs Lab 03/13/14 2108  WBC 13.5*  HGB 11.7*  HCT 38.1  PLT 368    Recent Labs Lab 03/13/14 2108  NA 138  K 5.7*  CL 109  CO2 17*  BUN 71*  CREATININE 2.56*  GLUCOSE 206*  CALCIUM 8.5     BNP 978.6  Troponin 0.04  Urinalysis    Component Value Date/Time   COLORURINE GREEN* 03/14/2014 0122   APPEARANCEUR CLOUDY* 03/14/2014 0122   LABSPEC 1.013 03/14/2014 0122   PHURINE 5.5 03/14/2014 0122   GLUCOSEU  NEGATIVE 03/14/2014 0122   HGBUR SMALL* 03/14/2014 0122   BILIRUBINUR NEGATIVE 03/14/2014 0122   BILIRUBINUR NEG 07/02/2012 1620   KETONESUR NEGATIVE 03/14/2014 0122   PROTEINUR 30* 03/14/2014 0122   PROTEINUR 100 07/02/2012 1620   UROBILINOGEN 0.2 03/14/2014 0122   UROBILINOGEN 0.2 07/02/2012 1620   NITRITE NEGATIVE 03/14/2014 0122   NITRITE POSITIVE 07/02/2012 1620   LEUKOCYTESUR LARGE* 03/14/2014 0122      ABG: 7.291/34.1/341/16.1  EKG: A fib, bradycardic, T wave inversion in lateral leads (present on previous EKG as well)  CXR (1/1): Vascular congestion noted. Bibasilar airspace opacities and increased interstitial markings raise concern for mild interstitial edema. Suspect small bilateral pleural effusions.  Shirlee Latch, MD 03/14/2014, 12:21 AM PGY-1, [facility] Family Medicine FPTS Intern pager: [phone], text pages welcome  I have seen and  examined the patient. I have read and agree with the above note. My changes are noted in blue.  Jacquelin Hawking, MD PGY-2, [facility] Family Medicine 03/14/2014, 9:20 AM

## 2014-03-14 NOTE — Consult Note (Signed)
Palliative Medicine Team Consult Note  81 yo with multiple chronic medical problems including end stage CHF with renal failure, SSS, and  Failure to thrive who was admitted with worsening dyspnea and bloody stools.  4 admissions in the last 6 months, family had hesitated to get palliative involved due to misconceptions about what "palliative" and also "Hospice" means. I spoke with patient's daughter China and her Mariel Sleet in detail. Their stated goals are comfort and to keep her at home. I provided reassurance that sometimes people with very serious disease and advanced age live longer and better with a more palliative approach that subjecting her to repeated hospitalizations and procedures which cause more distress and introduce more risk. They understand this concept. They also had misconceptions about what code status meant- I made a very strong recommendation for DNR-it would be medically inappropriate and probably unethical to perform CPR, intubation, defibrillation and chest compression knowing her frailty and that those interventions would not be successful. I reassured family that DNR did not mean do not treat and that she could continue to take any of her medications for as long as she was able and that her volume and CHF could be managed with the home care team.  No advisable interventions for ACS other Zabria ASA and comfort care.  Patient daughter is going to discuss with her older brother and will return my call in the AM about transfer home with hospice-may be able to go home tomorrow if hospice services can be arranged and admission to hospice can be done tomorrow.   Currently Ms. Sligar looks very uncomfortable- she is pulling at her sat monitor, her cardiac leads and will not keep her O2 on- she did very well with an IV dose of morphine this AM.  Recommendations:  1. DNR 2. Roxanol  q2 prn for dyspnea and pain 3. Haldol low dose prn for agitation 4. Home with Hospice hopefully  tomorrow 5. Continue all other medications as toelrated 6. Liberalize diet 7. Minimize painful interventions.  Will follow closely. Await call from her daughter in AM for final discussion on next steps concerning hospice.  Anderson Malta, DO Palliative Medicine [phone]  Time: 6PM-7:10PM 70 minutes [place] 50%  of this time was spent counseling and coordinating care related to the above assessment and plan.

## 2014-03-14 NOTE — Progress Notes (Signed)
Pt noted for increased agitation with bipap mask on.  Bipap mask removed per MD and pt placed on 50% vm.  Pt appears to be more comfortable with ventimask.  HR64, spo2 100%.  RN aware.  MD notified.

## 2014-03-15 LAB — URINE CULTURE: Colony Count: 40000

## 2014-03-15 MED ORDER — MORPHINE SULFATE (CONCENTRATE) 10 MG/0.5ML PO SOLN: 5.0000 mg | ORAL | Status: AC | PRN

## 2014-03-15 MED ORDER — TORSEMIDE 20 MG PO TABS: 20.0000 mg | ORAL_TABLET | Freq: Every day | ORAL | Status: AC

## 2014-03-15 NOTE — Discharge Summary (Signed)
Family Medicine Teaching [facility] Discharge Summary  Patient name: Yvette [name] Medical record number: [id number] Date of birth: 02/21/1917 Age: 81 y.o. Gender: female Date of Admission: 03/13/2014  Date of Discharge: 03/15/2014  Admitting Physician: Nestor Ramp, MD  Primary Care Provider: Maryjean Ka, MD Consultants: Palliative, Cardiology  Indication for Hospitalization: Shortness of breath  Discharge Diagnoses/Problem List:  Acute respiratory failure CHF exacerbation  Disposition: home with home hospice  Discharge Condition: hemodynamically stable.  Discharge Exam:  General: Thin, elderly women, resting comfortably, NAD. HEENT: NCAT, dentition poor Cardiovascular: irregularly irregular rhythm, no m/r/g. DP pulses 2+ bilaterally Respiratory: Normal WOB, no wheezes/crackles,  Abdomen: Distended, soft, +BS, no masses/rebound/guarding Extremities: Cool to touch, no edema Skin: Intact, no rashes noted, large ecchymoses over abdomen Neuro: Sleeping, but easily aroused, moves all extremities.  Brief Hospital Course:  Patient is a 81 year old female presenting with shortness of breath. Patient reports symptoms beginning approximately 5 PM the night prior to admission. She was noted to be up 3 pounds from her previous dry weight. She denied any fevers coughing or chest pain. No dysuria frequency urgency. There is discussion with the family about palliative care consultation at the time of admission. Family was open to this recommendation.  In the ED patient was placed on BiPAP for acute respiratory failure. She is also given Lasix 20 mg IV 1 for CHF exacerbation. Patient was admitted to the family medicine inpatient teaching service. IV Lasix was increased to 40 mg twice a day. Strict I's and O's and daily weights were ordered. Palliative care was consulted. Seen in after patient was placed on a DO NOT RESUSCITATE status. Palliative care recommended Roxanol 5 mg every 2 hours  when necessary for dyspnea and pain. Haldol low dose as needed for agitation.  Home hospice care was set up prior to discharge. Patient's torsemide was increased to 20 mg daily (up from 4 times weekly). A prescription for morphine oral solution was added to outpatient regimen. Patient was discharged from our care, with Home Hospice to f/u immediately after DC.  Issues for Follow Up:  Comfort Care Respiratory effort  Significant Procedures: none  Significant Labs and Imaging:   Recent Labs Lab 03/13/14 2108 03/14/14 0400  WBC 13.5* 13.9*  HGB 11.7* 11.9*  HCT 38.1 38.2  PLT 368 307    Recent Labs Lab 03/09/14 1024 03/13/14 2108 03/14/14 0400  NA 142 138 140  K 4.7 5.7* 5.3*  CL 108 109 111  CO2 23 17* 18*  GLUCOSE 151* 206* 124*  BUN 83* 71* 74*  CREATININE 2.45* 2.56* 2.65*  CALCIUM 8.8 8.5 8.7  ALKPHOS  --  50  --   AST  --  28  --   ALT  --  21  --   ALBUMIN  --  3.7  --      Results/Tests Pending at Time of Discharge: none  Discharge Medications:    Medication List    TAKE these medications        aspirin EC 81 MG tablet  Take 81 mg by mouth daily.     diclofenac sodium 1 % Gel  Commonly known as:  VOLTAREN  Apply 2 g topically 4 (four) times daily.     diltiazem 360 MG 24 hr capsule  Commonly known as:  CARDIZEM CD  Take 1 capsule (360 mg total) by mouth daily.     isosorbide mononitrate 30 MG 24 hr tablet  Commonly known as:  IMDUR  Take 15  mg by mouth daily.     Melatonin 5 MG Tabs  Take 10 mg by mouth at bedtime.     metoprolol tartrate 12.5 mg Tabs tablet  Commonly known as:  LOPRESSOR  Take 0.5 tablets (12.5 mg total) by mouth 2 (two) times daily. (BETA BLOCKER); Crush and administer in puree     morphine CONCENTRATE 10 MG/0.5ML Soln concentrated solution  Take 0.25 mLs (5 mg total) by mouth every 2 (two) hours as needed for moderate pain, severe pain, anxiety or shortness of breath (Distress, Respirations >25).     multivitamin with  minerals Tabs tablet  Take 1 tablet by mouth daily.     nitroGLYCERIN 0.4 MG SL tablet  Commonly known as:  NITROSTAT  Place 1 tablet (0.4 mg total) under the tongue every 5 (five) minutes as needed for chest pain.     pantoprazole 40 MG tablet  Commonly known as:  PROTONIX  Take 40 mg by mouth daily.     pindolol 5 MG tablet  Commonly known as:  VISKEN  - Take 2.5-5 mg by mouth 2 (two) times daily. Take 1 tablet in the morning  - Take 0.5 tablet  In the evening     silver sulfADIAZINE 1 % cream  Commonly known as:  SILVADENE  Apply 1 application topically daily. To affected skin areas.     torsemide 20 MG tablet  Commonly known as:  DEMADEX  Take 1 tablet (20 mg total) by mouth daily.     URIBEL 118 MG Caps  Take 1 capsule by mouth 4 (four) times daily.        Discharge Instructions: Please refer to Patient Instructions section of EMR for full details.  Patient was counseled important signs and symptoms that should prompt return to medical care, changes in medications, dietary instructions, activity restrictions, and follow up appointments.   Follow-Up Appointments: Follow-up Information    Follow up with [facility].   Why:  hospice care   Contact information:   [address] [place] [address] [phone]       Follow up with Maryjean Ka, MD. Call on 03/16/2014.   Specialty:  Family Medicine   Contact information:   [address] [place] [address] [phone]       Kathee Delton, MD 03/15/2014, 2:18 PM PGY-1, [facility] Family Medicine

## 2014-03-15 NOTE — Care Management Note (Addendum)
    Page 1 of 2   03/15/2014     4:41:40 PM CARE MANAGEMENT NOTE 03/15/2014  Patient:  Yvette [name], Yvette [name]   Account Number:  [id number]  Date Initiated:  03/15/2014  Documentation initiated by:  [facility]  Subjective/Objective Assessment:   adm: Acute respiratory failure     Action/Plan:   discharge planning   Anticipated DC Date:  03/15/2014   Anticipated DC Plan:  [place]  CM consult      PAC Choice  HOSPICE   Choice offered to / List presented to:  C-4 Adult Children      DME agency  [place]   Status of service:  Completed, signed off Medicare Important Message given?   (If response is "NO", the following Medicare IM given date fields will be blank) Date Medicare IM given:   Medicare IM given by:   Date Additional Medicare IM given:   Additional Medicare IM given by:    Discharge Disposition:  [place]  Per UR Regulation:    If discussed at Long Length of Stay Meetings, dates discussed:    Comments:  03/15/13 15:00 Cm received callback from [place] of [facility] and she states she has arranged with family admission this afternoon and family is requesting ambulance transport home.  CM notified Marcene Brawn of readiness and called CSW who states Marcene Brawn has already called him for transportation of pt home.  No other CM needs were communicated.  Mariane Masters, BSN, [place]. 03/15/14 12:00 CM received call from [place], [place] who inquires why the consult placed yesterday for CM had not been addressed.  Despite Kara's inquiry, the hospice CM consult was not placed until this am.  Joesphine Bare she "knows how this works" and she works for [employer] and she has already given [facility] a "heads up."  CM met pt, mother Yvette [name] and grandson in the room to offer choice.  CM asked if an interpreter was needed but both daughter of pt and grandson state no and [place] is fine with them.   CM called [facility] and spoke with Butch Penny who was unsure as to whether they could do an admission today.  CM then received a call from [place] from [place] who, despite Kara's claim of working for [facility], states Marcene Brawn  no longer works for them though Marcene Brawn  did call this am to make a referral.  Cherie states she informed Marcene Brawn of the process of referrals and "choice."  Cherie has access to [facility] and does not need any information faxed to her.  Cherie states she will accept the referral and will arrange for DME delivery.  No other CM needs were communicated.  Mariane Masters, BSN, CM [phone].

## 2014-03-15 NOTE — Social Work (Signed)
CSW coordinated transportation for patient to return home. No further CSW needs. Beverly Sessions MSW, LCSW [phone]

## 2014-03-15 NOTE — Discharge Instructions (Signed)
Suy Tim (Heart Failure) Suy tim l tnh tr?ng tim c v?n ?? v? b?m mu. ?i?u ny c ngh?a l tim khng b?m ?? mu ?? c? th? c?a qu v? ho?t ??ng t?t. Trong m?t s? tr??ng h?p suy tim, d?ch c th? tro ng??c vo ph?i ho?c qu v? b? s?ng n? (ph) c?ng chn. Suy tim th??ng l tnh tr?ng lu di (m?n tnh). ?i?u quan trong ??i v?i qu v? l t? ch?m [place] t?t cho b?n thn v th?c hi?n theo k? ho?ch ?i?u tr? c?a chuyn gia ch?m [place] s?c kh?e. NGUYN NHN  M?t s? tnh tr?ng s?c kh?e c th? gy ra suy tim. Nh?ng tnh tr?ng s?c kh?e bao g?m:  Huy?t p cao (t?ng huy?t p) T?ng huy?t p lm cho c? tim lm vi?c nhi?u h?n bnh th??ng. Khi p su?t trong m?ch mu cao, tim c?n b?m (co bp) b?ng nhi?u l?c h?n ?? l?u thng mu trn kh?p c? th?. Huy?t p cao cu?i cng s? lm cho tim tr? nn c?ng v y?u.  B?nh ??ng m?ch vnh (CAD). CAD l s? tch t? cholesterol v m? (m?ng bm) trong ??ng m?ch c?a tim. T?c ??ng m?ch l?y ?i oxy v mu c?a c? tim. ?i?u ny c th? gy ?au ng?c v c th? d?n ??n nh?i mu c? tim. Huy?t p cao c?ng c th? gp phn gy ra CAD.  ?au tim (nh?i mu c? tim). Nh?i mu c? tim xu?t hi?n khi m?t ho?c nhi?u ??ng m?ch trong tim b? t?c. M?t oxy lm t?n th??ng m c? tim. Khi ?i?u ny x?y ra, m?t ph?n c?a c? tim s? ch?t. M b? t?n th??ng khng co bp t?t v lm suy y?u kh? n?ng b?m mu c?a tim.  Van tim d? th??ng. Khi van tim khng m? v ?ng bnh th??ng, n c th? gy suy tim. ?i?u ny lm cho c? tim b?m nhi?u h?n ?? gi? cho mu ch?y lin t?c.  B?nh c? tim (b?nh c? tim ho?c vim c? tim). B?nh c? tim l t?n th??ng c? tim do nh?ng nguyn nhn khc nhau. Nh?ng nguyn nhn ny c th? bao g?m l?m d?ng ma ty ho?c r??u, nhi?m trng ho?c khng r l do. Nh?ng y?u t? ny c th? lm t?ng nguy c? suy tim.  B?nh ph?i. B?nh ph?i khi?n cho tim lm vi?c nhi?u h?n v ph?i khng ho?t ??ng bnh th??ng. ?i?u ny c th? gy ra tr?ng thi c?ng th?ng trn tim, d?n ??n suy tim.  Ti?u ???ng. Ti?u ???ng lm t?ng nguy c? suy tim.  ???ng trong mu cao gp ph?n lm n?ng ?? m? (lipit) trong mu cao. Ti?u ???ng c?ng c th? t? t? gy t?n th??ng cc m?ch mu nh? mang nh?ng ch?t dinh d??ng quan tr?ng ??n c? tim. Khi tim khng nh?n ?? oxy v mu, n c th? lm tim tr? nn y?u v c?ng. ?i?u ny lm cho tim khng co bp hi?u qu?.  Cc tnh tr?ng khc c th? gp ph?n gy suy tim. Cc tnh tr?ng ? bao g?m nh?p tim b?t th??ng, b?nh v? tuy?n gip v s? l??ng t? bo mu th?p (thi?u mu). M?t s? hnh vi khng lnh m?nh c th? lm t?ng nguy c? suy tim, bao g?m:  Th?a cn.  Ht thu?c ho?c nhai thu?c l.  ?n th?c ?n nhi?u ch?t bo v cholesterol.  L?m d?ng ma ty b?t h?p php ho?c r??u.  Thi?u ho?t ??ng th? ch?t. TRI?U CH?NG  Tri?u ch?ng suy tim c  th? khc nhau v c th? kh pht hi?n. Tri?u ch?ng c th? bao g?m:  Kh th? khi ho?t ??ng, ch?ng h?n nh? leo c?u thang.  Ho lin t?c.  S?ng ? bn chn, c? chn, chn ho?c b?ng.  T?ng cn khng r nguyn nhn.  Kh th? khi n?m th?ng (kh th? n?m).  T?nh gi?c trong khi ng? v c?n ng?i d?y v ht th? thm khng kh.  Tim ??p nhanh.  M?t m?i v m?t s?c.  C?m gic chong vng, chng m?t ho?c s?p s?a ng?t.  ?n khng ngon mi?ng.  Bu?n nn.  ?i ti?u nhi?u vo ban ?m (ti?u ?m). CH?N ?ON  Ch?n ?on suy tim d?a vo b?nh s? c?a qu v?, tri?u ch?ng, khm th?c th? v cc ki?m tra ch?n ?on. Cc ki?m tra ch?n ?on suy tim c th? bao g?m:  Siu m tim.  ?i?n tm ??.  Ch?p X quang ng?c.  Xt nghi?m mu.  Nghi?m php g?ng s?c.  Ch?p ??ng m?ch tim.  Ch?p qut h?t nhn phng x?. ?I?U TR?  ?i?u tr? nh?m qu?n l tri?u ch?ng suy tim. Thu?c, thay ??i hnh vi ho?c can thi?p b?ng ph?u thu?t c th? c?n ?? ?i?u tr? suy tim.  Thu?c gip ?i?u tr? suy tim c th? bao g?m:  Thu?c ?c ch? men chuy?n angiotensin (ACE). Lo?i thu?c ny ng?n ch?n tc d?ng c?a m?t lo?i protein trong mu c tn l men chuy?n angiotensin. Cc thu?c ?c ch? ACE lm gin (gin n?) m?ch mu v gip gi?m p  huy?t.  Thu?c ch?n th? th? angiotensin (ARB). Lo?i thu?c ny ng?n ch?n ho?t ??ng c?a m?t lo?i protein trong mu c tn l angiotensin. Thu?c ch?n th? th? angiotensin lm gin cc m?ch mu v gip h? huy?t p.  Thu?c lm ?i ti?u nhi?u (thu?c l?i ti?u). Thu?c l?i ti?u lm cho th?n lo?i b? mu?i v n??c ra kh?i mu. L??ng d?ch d? th?a ???c lo?i b? qua ???ng ti?u. Vi?c lo?i b? l??ng d?ch d? th?a ny s? lm gi?m kh?i l??ng mu tim b?m.  Thu?c ch?n beta. Thu?c ny ng?n khng cho tim ??p qu nhanh v c?i thi?n s?c m?nh c?a c? tim.  Thu?c tr? tim (digitalis). Thu?c ny lm t?ng s?c m?nh c?a nh?p tim.  Thay ??i hnh vi lnh m?nh bao g?m:  C v duy tr tr?ng l??ng kh?e m?nh.  D?ng ht thu?c ho?c nhai thu?c l.  ?n th?c ?n t?t cho tim.  H?n ch? ho?c trnh u?ng r??u.  Ng?ng s? d?ng ma ty b?t h?p php.  Ho?t ??ng th? ch?t theo ch? d?n c?a chuyn gia ch?m [place] s?c kh?e.  ?i?u tr? b?ng ph?u thu?t cho suy tim c th? bao g?m:  Th? thu?t thng ??ng m?ch b? t?c, s?a ch?a van tim b? t?n th??ng ho?c lo?i b? m c? tim b? t?n th??ng.  My tr? tim ?? c?i thi?n ch?c n?ng c?a c? tim v ki?m sot nh?ng nh?p tim khng bnh th??ng nh?t ??nh.  M?t my kh? rung tim bn trong ?? ?i?u tr? m?t s? nh?p tim b?t th??ng nghim tr?ng nh?t ??nh.  M?t thi?t b? h? tr? tm th?t tri (LVAD) ?? h? tr? kh? n?ng b?m c?a tim. H??NG D?N CH?M [place] T?I NH   Ch? s? d?ng thu?c theo ch? d?n c?a chuyn gia ch?m  s?c kh?e. Thu?c ?ng vai tr quan tr?ng trong vi?c lm gi?m kh?i l??ng cng vi?c c?a tim, lm ch?m s? ti?n tri?n c?a suy tim v  c?i thi?n cc tri?u ch?ng c?a qu v?.  Khng ng?ng s? d?ng thu?c tr? khi ???c chuyn gia ch?m [employer] s?c kh?e ch? d?n.  Khng b? b?t k? li?u thu?c no.  Mua thm ?? thu?c theo ??n tr??c khi h?t thu?c. Thu?c c?a qu v? l c?n thi?t m?i ngy.  Tham gia ho?t ??ng th? ch?t ? m?c ?? v?a ph?i n?u ???c chuyn gia ch?m [place] s?c kh?e ch? d?n. Ho?t ??ng th? ch?t ? m?c ? v?a ph?i c th? c l?i cho m?t s?  ng??i. Ng??i cao tu?i v nh?ng ng??i b? suy tim n?ng c?n tham kh?o  ki?n c?a chuyn gia ch?m [place] s?c kh?e ?? c ???c l?i khuyn v? ho?t ??ng th? ch?t.  ?n th?c ?n t?t cho tim. Nn l?a ch?n th?c ph?m khng c ch?t bo chuy?n d?ng, ch?t bo bo ha, cholesterol v mu?i (natri). L?a ch?n c l?i cho s?c kh?e bao g?m tri cy v rau t??i ho?c ?ng l?nh, c, th?t n?c, cc lo?i ??u, cc s?n ph?m s?a khng bo ho?c t ch?t bo v ng? c?c nguyn h?t ho?c cc lo?i th?c ph?m giu ch?t x?. Ni chuy?n v?i chuyn gia dinh d??ng ?? tm hi?u thm v? nh?ng lo?i th?c ph?m t?t cho tim.  H?n ch? dng mu?i theo ch? d?n c?a chuyn gia ch?m [place] s?c kh?e. H?n ch? mu?i c th? lm gi?m tri?u ch?ng suy tim ? m?t s? ng??i. Ni chuy?n v?i chuyn gia dinh d??ng ?? tm hi?u thm v? nh?ng lo?i gia v? t?t cho tim.  S? d?ng cc ph??ng php n?u ?n c l?i cho s?c kh?e. Ph??ng php n?u ?n c l?i cho s?c kh?e bao g?m rang, b? l, hun nng, n??ng, lu?c, h?p hay xo. Ni chuy?n v?i chuyn gia dinh d??ng ?? tm hi?u thm v? ph??ng php n?u ?n c l?i cho s?c kh?e.  H?n ch? n??c theo ch? d?n c?a chuyn gia ch?m [place] s?c kh?e. H?n ch? n??c c th? lm gi?m tri?u ch?ng suy tim ? m?t s? ng??i.  T? ki?m tra cn n?ng m?i ngy. Ki?m tra cn n?ng hng ngy r?t quan tr?ng trong vi?c nh?n bi?t s?m v?n ?? th?a n??c. Qu v? nn t? ki?m tra cn n?ng m?i bu?i sng sau khi ?i ti?u v tr??c khi ?n sng. M?c cng m?t l??ng qu?n o m?i l?n t? ki?m tra cn n?ng. Ghi l?i cn n?ng hng ngy c?a qu v?. Cung c?p ph?n ghi chp cn n?ng c?a qu v? cho chuyn gia ch?m [place] s?c kh?e.  Theo di v ghi l?i huy?t p c?a qu v? n?u ???c chuyn gia ch?m [place] s?c kh?e ch? d?n.  Ki?m tra nh?p tim n?u ???c chuyn gia ch?m [place] s?c kh?e ch? d?n.  Gi?m cn n?u ???c chuyn gia ch?m [place] s?c kh?e ch? d?n. Gi?m cn c th? lm gi?m tri?u ch?ng suy tim ? m?t s? ng??i.  D?ng ht thu?c ho?c d?ng nhai thu?c l. Nicotine lm cho tim qu v? lm vi?c nhi?u h?n v n lm cho cc m?ch mu  cht h?t l?i. Khng s? d?ng k?o ho?c mi?ng dn c nicotine tr??c khi ni chuy?n v?i chuyn gia ch?m [place] s?c kh?e.  Tun th? m?i cu?c h?n khm l?i theo ch? d?n c?a chuyn gia ch?m  s?c kh?e. ?i?u ny l quan tr?ng.  Gi?i h?n l??ng r??u qu v? u?ng khng qu 1 ly m?i ngy v?i ph? n? khng mang thai v 2 ly m?i ngy v?i nam gi?i. 1 ly  l t??ng ???ng v?i 12 aox? bia, 5 aox? r??u vang, ho?c 1 aox? r??u m?nh. U?ng nhi?u h?n th? s? c h?i cho tim c?a qu v?. Cho chuyn gia ch?m [place] s?c kh?e bi?t n?u qu v? u?ng r??u vi l?n m?i tu?n. Ni chuy?n v?i chuyn gia ch?m [place] s?c kh?e v? vi?c u?ng r??u c an ton cho qu v? hay khng. N?u tim qu v? ? b? t?n th??ng b?i r??u ho?c qu v? b? suy tim n?ng, qu v? nn d?ng h?n vi?c u?ng r??u.  Ng?ng s? d?ng ma ty b?t h?p php.  Lun c?p nh?t cc l?n tim ch?ng. ?i?u ??c bi?t quan tr?ng l ng?n ng?a nhi?m trng ???ng h h?p thng qua cc l?n tim ch?ng ph? c?u khu?n v cm hi?n nay.  Qu?n l cc tnh tr?ng s?c kh?e khc nh? t?ng huy?t p, ti?u ???ng, b?nh tuy?n gip ho?c nh?p tim b?t th??ng theo ch? d?n c?a chuyn gia ch?m  s?c kh?e.  H?c cch qu?n l c?ng th?ng.  L?p k? ho?ch v? th?i gian ngh? ng?i khi m?t m?i.  Tm hi?u cc chi?n l??c qu?n l nhi?t ?? cao. N?u th?i ti?t v cng nng:  Trnh ho?t ??ng th? ch?t m?nh.  S? d?ng ?i?u ha khng kh ho?c qu?t ho?c tm ch? mt.  Trnh caffeine v r??u.  M?c qu?n o r?ng, nh? v sng mu.  Tm hi?u cc chi?n l??c ?? qu?n l nhi?t ?? th?p. N?u th?i ti?t v cng l?nh:  Trnh ho?t ??ng th? ch?t m?nh.  M?c nhi?u l?p qu?n o.  ?eo g?ng tay, ??i m? v qung kh?n khi ?i ra ngoi.  Trnh u?ng r??u.  Ti?p nh?n gio d?c v h? tr? th??ng xuyn khi c?n.  Tham gia ho?c tm cch ph?c h?i ch?c n?ng khi c?n thi?t ?? duy tr ho?c c?i thi?n kh? n?ng ??c l?p v ch?t l??ng cu?c s?ng. ?I [facility] N?U:   Cn n?ng c?a qu v? t?ng thm 3 pao (1,4 kg) trong 1 ngy ho?c 5 pao (2,3 kg) trong 1 tu?n.  Qu v? b? kh th?  nhi?u h?n v ?i?u ny l khng bnh th??ng v?i qu v?.  Qu v? khng th? tham gia vo cc ho?t ??ng th? ch?t thng th??ng.  Qu v? d? dng b? m?t m?i.  Qu v? ho nhi?u h?n bnh th??ng, ??c bi?t l v?i ho?t ??ng th? ch?t.  Qu v? b? s?ng nhi?u h?n ? nh?ng ch? nh? tay, chn, c? chn ho?c b?ng.  Qu v? khng th? ng? ???c v kh th?.  Qu v? c?m th?y nh? tim ??p nhanh (?nh tr?ng ng?c).  Qu v? b? chng m?t ho?c chong vng khi ??ng ln. NGAY L?P T?C ?I [facility] N?U:   Qu v? b? kh th?.  C thay ??i tr?ng thi tinh th?n nh? gi?m t?nh to ho?c kh t?p trung.  Qu v? b? ?au ho?c kh ch?u ? ng?c.  Qu v? b? ng?t x?u (b?t t?nh). ??M B?O QU V?:   Hi?u r cc h??ng d?n ny.  S? theo di tnh tr?ng c?a mnh.  S? yu c?u tr? gip ngay l?p t?c n?u qu v? c?m th?y khng kh?e ho?c th?y tr?m tr?ng h?n. Document Released: 02/27/2005 Document Revised: 07/14/2013 [facility] Patient Information 2015 [place], [place]. This information is not intended to replace advice given to you by your health care provider. Make sure you discuss any questions you have with your health care provider.

## 2014-03-15 NOTE — Progress Notes (Signed)
FMTS Attending Note Patient seen and examined by me, discussed with resident team this morning.  Patient's grandson and daughter-in-law in room at time of my visit. They voice agreement with plan for discharge to home with home hospice today.  Patient is comfortable-appearing, in no apparent distress.  Breathing comfortably.   A/P: Plan for discharge to home with home hospice later today. CSW consult to assist in coordinating formal hospice evaluation and planning for return to home.  Patient's daughter will be by around 12:30pm today (is working this morning).  Paula Compton, MD

## 2014-03-15 NOTE — Consult Note (Signed)
[facility]  Spoke to [place] and Sports coach for pt regarding pt going home today with hospice services in place. I have been able to speak to the daughter [place] and confirmed address as well as equipment needed in home. WE have ordered equipment of hospital bed with short rails, gel overlay, oxygen, over bed table, and wheelchair to be delivered today. Pt will nee dto go home by ambulance due to her lethergy. Cace worker Huntley Dec notified so that she could arrange and print out a transfer slip for ambulance driver. Pt will be calling us at [phone] when they arrive home and we will go to the home to meet them and assist in helping the pt with care in the home. Thank you for this referral. Norm Parcel [facility].

## 2014-03-19 ENCOUNTER — Inpatient Hospital Stay: Payer: Medicaid Other | Admitting: Family Medicine

## 2014-03-20 LAB — CULTURE, BLOOD (ROUTINE X 2)
CULTURE: NO GROWTH
Culture: NO GROWTH

## 2014-07-30 ENCOUNTER — Other Ambulatory Visit: Payer: Self-pay | Admitting: Family Medicine

## 2015-03-30 IMAGING — CR DG CHEST 1V PORT
1 series · 1 of 1 positions shown · non-contrast
Comparison: Chest radiograph from 03/03/2014

CLINICAL DATA: Acute onset of difficulty breathing. Generalized
abdominal pain. Initial encounter.

EXAM:
PORTABLE CHEST - 1 VIEW

[AP]
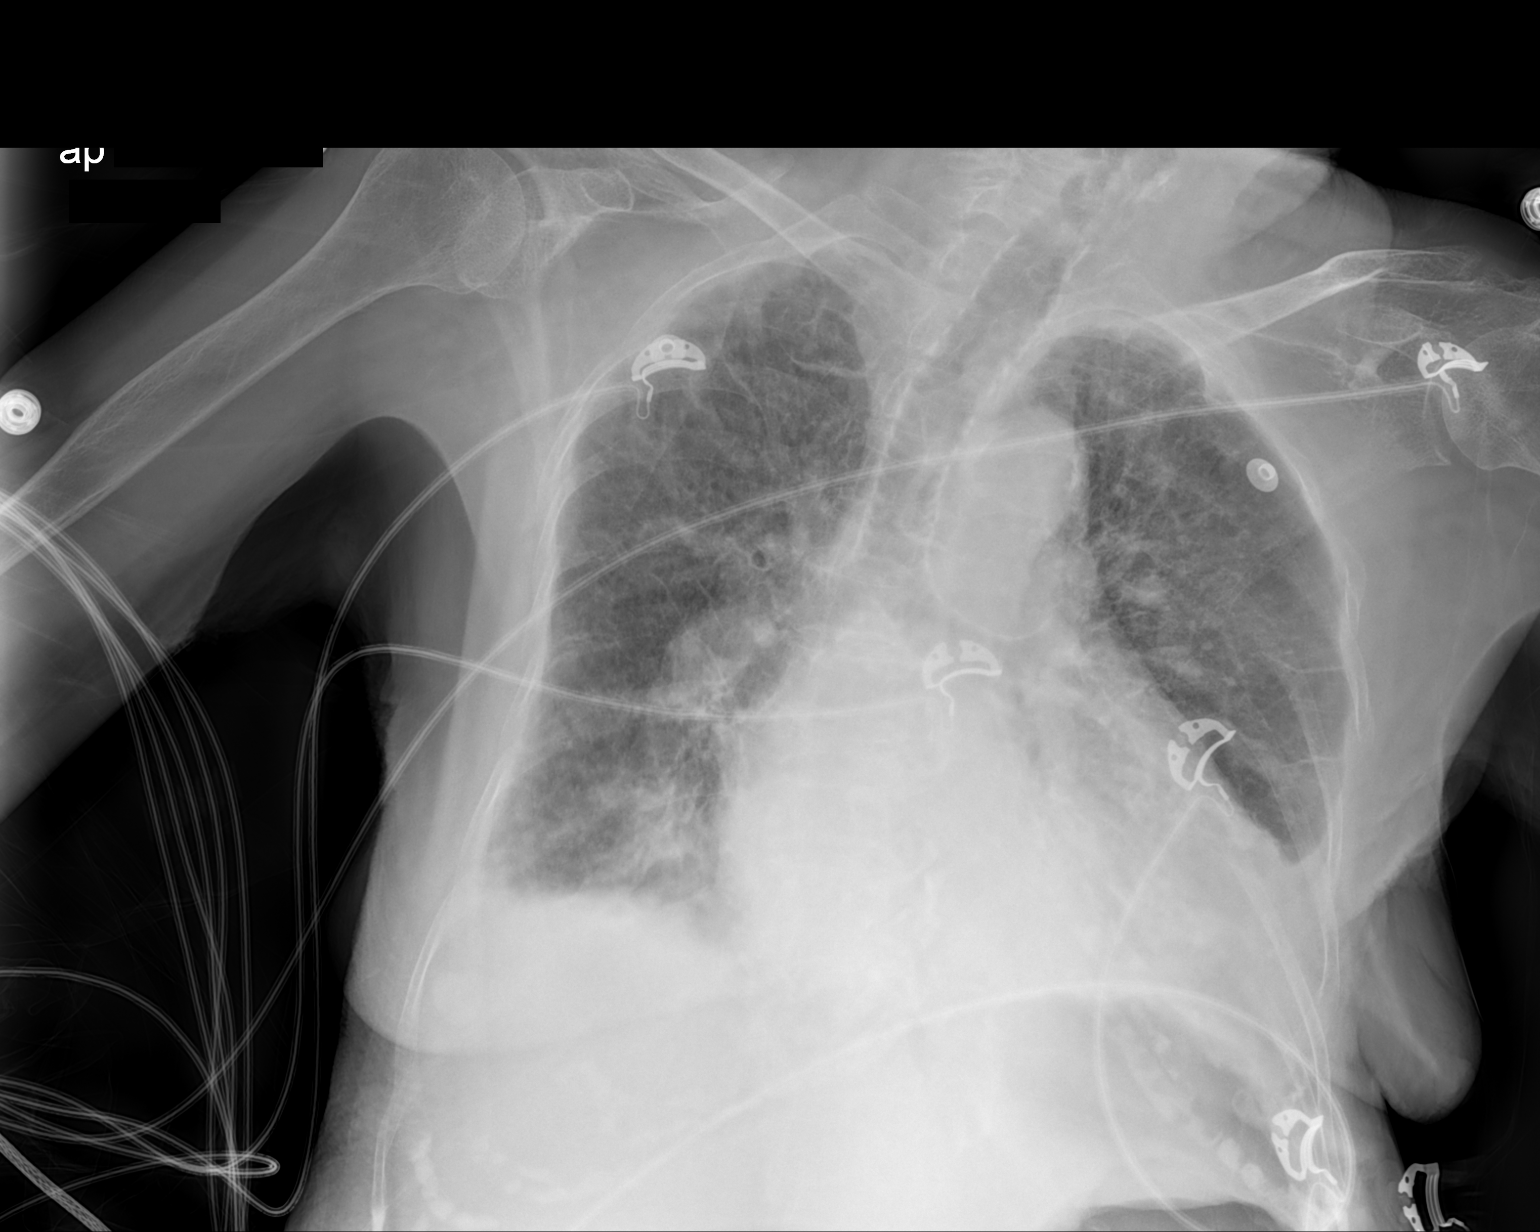

[1 of 1 positions shown; findings below may reference images not displayed]

FINDINGS: The lungs are well-aerated. Vascular congestion is noted. Bibasilar
airspace opacities and increased interstitial markings raise concern
for mild interstitial edema. Small bilateral pleural effusions are
suspected. No pneumothorax is seen.

The cardiomediastinal silhouette is borderline normal in size. No
acute osseous abnormalities are seen.
IMPRESSION: Vascular congestion noted. Bibasilar airspace opacities and
increased interstitial markings raise concern for mild interstitial
edema. Suspect small bilateral pleural effusions.

## 2020-11-11 DEATH — deceased
